# Patient Record
Sex: Female | Born: 1965 | Race: Black or African American | Hispanic: No | Marital: Married | Smoking: Current some day smoker
Health system: Southern US, Community
[De-identification: ages and names within clinical notes are randomized; demographics above are authoritative.]

## PROBLEM LIST (undated history)

## (undated) DIAGNOSIS — I10 Essential (primary) hypertension: Secondary | ICD-10-CM

## (undated) DIAGNOSIS — K625 Hemorrhage of anus and rectum: Secondary | ICD-10-CM

## (undated) DIAGNOSIS — R002 Palpitations: Secondary | ICD-10-CM

## (undated) DIAGNOSIS — E669 Obesity, unspecified: Secondary | ICD-10-CM

## (undated) DIAGNOSIS — B379 Candidiasis, unspecified: Secondary | ICD-10-CM

## (undated) DIAGNOSIS — G4733 Obstructive sleep apnea (adult) (pediatric): Secondary | ICD-10-CM

## (undated) HISTORY — DX: Obstructive sleep apnea (adult) (pediatric): G47.33

## (undated) HISTORY — DX: Essential (primary) hypertension: I10

## (undated) HISTORY — DX: Hemorrhage of anus and rectum: K62.5

## (undated) HISTORY — PX: ABDOMINAL HYSTERECTOMY: SHX81

## (undated) HISTORY — DX: Palpitations: R00.2

## (undated) HISTORY — PX: OTHER SURGICAL HISTORY: SHX169

## (undated) HISTORY — DX: Obesity, unspecified: E66.9

---

## 1998-02-15 ENCOUNTER — Emergency Department (HOSPITAL_COMMUNITY): Admission: EM | Admit: 1998-02-15 | Discharge: 1998-02-15 | Payer: Self-pay | Admitting: Internal Medicine

## 1998-05-19 ENCOUNTER — Emergency Department (HOSPITAL_COMMUNITY): Admission: EM | Admit: 1998-05-19 | Discharge: 1998-05-19 | Payer: Self-pay

## 1998-09-07 ENCOUNTER — Emergency Department (HOSPITAL_COMMUNITY): Admission: EM | Admit: 1998-09-07 | Discharge: 1998-09-07 | Payer: Self-pay | Admitting: Emergency Medicine

## 1998-09-07 ENCOUNTER — Encounter: Payer: Self-pay | Admitting: Emergency Medicine

## 1998-09-25 ENCOUNTER — Emergency Department (HOSPITAL_COMMUNITY): Admission: EM | Admit: 1998-09-25 | Discharge: 1998-09-25 | Payer: Self-pay | Admitting: Emergency Medicine

## 1999-01-11 ENCOUNTER — Other Ambulatory Visit: Admission: RE | Admit: 1999-01-11 | Discharge: 1999-01-11 | Payer: Self-pay | Admitting: Obstetrics and Gynecology

## 1999-04-13 ENCOUNTER — Emergency Department (HOSPITAL_COMMUNITY): Admission: EM | Admit: 1999-04-13 | Discharge: 1999-04-13 | Payer: Self-pay | Admitting: Emergency Medicine

## 1999-05-28 ENCOUNTER — Emergency Department (HOSPITAL_COMMUNITY): Admission: EM | Admit: 1999-05-28 | Discharge: 1999-05-28 | Payer: Self-pay | Admitting: Emergency Medicine

## 1999-05-30 ENCOUNTER — Emergency Department (HOSPITAL_COMMUNITY): Admission: EM | Admit: 1999-05-30 | Discharge: 1999-05-30 | Payer: Self-pay | Admitting: Emergency Medicine

## 1999-06-27 ENCOUNTER — Emergency Department (HOSPITAL_COMMUNITY): Admission: EM | Admit: 1999-06-27 | Discharge: 1999-06-27 | Payer: Self-pay | Admitting: Emergency Medicine

## 1999-07-14 ENCOUNTER — Encounter: Payer: Self-pay | Admitting: Emergency Medicine

## 1999-07-14 ENCOUNTER — Emergency Department (HOSPITAL_COMMUNITY): Admission: EM | Admit: 1999-07-14 | Discharge: 1999-07-14 | Payer: Self-pay | Admitting: Emergency Medicine

## 2000-01-23 ENCOUNTER — Emergency Department (HOSPITAL_COMMUNITY): Admission: EM | Admit: 2000-01-23 | Discharge: 2000-01-23 | Payer: Self-pay | Admitting: Emergency Medicine

## 2000-02-02 ENCOUNTER — Emergency Department (HOSPITAL_COMMUNITY): Admission: EM | Admit: 2000-02-02 | Discharge: 2000-02-02 | Payer: Self-pay | Admitting: Emergency Medicine

## 2000-02-02 ENCOUNTER — Other Ambulatory Visit: Admission: RE | Admit: 2000-02-02 | Discharge: 2000-02-02 | Payer: Self-pay | Admitting: Physical Therapy

## 2000-03-27 ENCOUNTER — Encounter: Payer: Self-pay | Admitting: Emergency Medicine

## 2000-03-27 ENCOUNTER — Emergency Department (HOSPITAL_COMMUNITY): Admission: EM | Admit: 2000-03-27 | Discharge: 2000-03-27 | Payer: Self-pay | Admitting: Emergency Medicine

## 2001-04-14 ENCOUNTER — Emergency Department (HOSPITAL_COMMUNITY): Admission: EM | Admit: 2001-04-14 | Discharge: 2001-04-14 | Payer: Self-pay | Admitting: Emergency Medicine

## 2001-11-19 ENCOUNTER — Emergency Department (HOSPITAL_COMMUNITY): Admission: EM | Admit: 2001-11-19 | Discharge: 2001-11-19 | Payer: Self-pay | Admitting: *Deleted

## 2001-11-20 ENCOUNTER — Encounter: Admission: RE | Admit: 2001-11-20 | Discharge: 2001-11-20 | Payer: Self-pay | Admitting: Obstetrics and Gynecology

## 2001-11-20 ENCOUNTER — Encounter: Payer: Self-pay | Admitting: Obstetrics and Gynecology

## 2001-11-28 ENCOUNTER — Other Ambulatory Visit: Admission: RE | Admit: 2001-11-28 | Discharge: 2001-11-28 | Payer: Self-pay | Admitting: Obstetrics and Gynecology

## 2001-12-17 ENCOUNTER — Emergency Department (HOSPITAL_COMMUNITY): Admission: EM | Admit: 2001-12-17 | Discharge: 2001-12-18 | Payer: Self-pay | Admitting: Emergency Medicine

## 2001-12-18 ENCOUNTER — Encounter: Payer: Self-pay | Admitting: Emergency Medicine

## 2003-06-17 ENCOUNTER — Emergency Department (HOSPITAL_COMMUNITY): Admission: EM | Admit: 2003-06-17 | Discharge: 2003-06-17 | Payer: Self-pay | Admitting: Emergency Medicine

## 2003-08-27 ENCOUNTER — Emergency Department (HOSPITAL_COMMUNITY): Admission: EM | Admit: 2003-08-27 | Discharge: 2003-08-27 | Payer: Self-pay | Admitting: Emergency Medicine

## 2003-10-14 ENCOUNTER — Emergency Department (HOSPITAL_COMMUNITY): Admission: EM | Admit: 2003-10-14 | Discharge: 2003-10-14 | Payer: Self-pay | Admitting: Emergency Medicine

## 2003-12-27 ENCOUNTER — Emergency Department (HOSPITAL_COMMUNITY): Admission: EM | Admit: 2003-12-27 | Discharge: 2003-12-27 | Payer: Self-pay | Admitting: Emergency Medicine

## 2004-02-09 ENCOUNTER — Emergency Department (HOSPITAL_COMMUNITY): Admission: EM | Admit: 2004-02-09 | Discharge: 2004-02-09 | Payer: Self-pay | Admitting: Emergency Medicine

## 2004-03-25 ENCOUNTER — Emergency Department (HOSPITAL_COMMUNITY): Admission: EM | Admit: 2004-03-25 | Discharge: 2004-03-25 | Payer: Self-pay | Admitting: Emergency Medicine

## 2004-04-26 ENCOUNTER — Inpatient Hospital Stay (HOSPITAL_COMMUNITY): Admission: AD | Admit: 2004-04-26 | Discharge: 2004-04-26 | Payer: Self-pay | Admitting: *Deleted

## 2004-07-14 ENCOUNTER — Inpatient Hospital Stay (HOSPITAL_COMMUNITY): Admission: AD | Admit: 2004-07-14 | Discharge: 2004-07-14 | Payer: Self-pay | Admitting: *Deleted

## 2004-07-26 ENCOUNTER — Other Ambulatory Visit: Admission: RE | Admit: 2004-07-26 | Discharge: 2004-07-26 | Payer: Self-pay | Admitting: Obstetrics and Gynecology

## 2004-08-04 ENCOUNTER — Ambulatory Visit: Payer: Self-pay | Admitting: Internal Medicine

## 2004-08-09 ENCOUNTER — Encounter: Admission: RE | Admit: 2004-08-09 | Discharge: 2004-08-09 | Payer: Self-pay | Admitting: Internal Medicine

## 2004-08-10 ENCOUNTER — Ambulatory Visit: Payer: Self-pay

## 2004-08-18 ENCOUNTER — Ambulatory Visit: Payer: Self-pay

## 2004-08-23 ENCOUNTER — Ambulatory Visit: Payer: Self-pay

## 2004-08-29 ENCOUNTER — Ambulatory Visit: Payer: Self-pay | Admitting: Internal Medicine

## 2004-09-01 ENCOUNTER — Ambulatory Visit: Payer: Self-pay | Admitting: Internal Medicine

## 2006-01-23 ENCOUNTER — Emergency Department (HOSPITAL_COMMUNITY): Admission: EM | Admit: 2006-01-23 | Discharge: 2006-01-23 | Payer: Self-pay | Admitting: Emergency Medicine

## 2006-03-04 ENCOUNTER — Emergency Department (HOSPITAL_COMMUNITY): Admission: EM | Admit: 2006-03-04 | Discharge: 2006-03-04 | Payer: Self-pay | Admitting: Family Medicine

## 2006-03-18 ENCOUNTER — Emergency Department (HOSPITAL_COMMUNITY): Admission: EM | Admit: 2006-03-18 | Discharge: 2006-03-18 | Payer: Self-pay | Admitting: Emergency Medicine

## 2006-04-13 ENCOUNTER — Emergency Department (HOSPITAL_COMMUNITY): Admission: EM | Admit: 2006-04-13 | Discharge: 2006-04-13 | Payer: Self-pay | Admitting: Emergency Medicine

## 2006-06-20 ENCOUNTER — Emergency Department (HOSPITAL_COMMUNITY): Admission: EM | Admit: 2006-06-20 | Discharge: 2006-06-20 | Payer: Self-pay | Admitting: Emergency Medicine

## 2006-11-22 ENCOUNTER — Emergency Department (HOSPITAL_COMMUNITY): Admission: EM | Admit: 2006-11-22 | Discharge: 2006-11-22 | Payer: Self-pay | Admitting: Emergency Medicine

## 2006-12-05 ENCOUNTER — Emergency Department (HOSPITAL_COMMUNITY): Admission: EM | Admit: 2006-12-05 | Discharge: 2006-12-05 | Payer: Self-pay | Admitting: Family Medicine

## 2007-05-26 ENCOUNTER — Encounter: Payer: Self-pay | Admitting: Internal Medicine

## 2007-05-26 ENCOUNTER — Ambulatory Visit: Payer: Self-pay | Admitting: Internal Medicine

## 2007-05-26 ENCOUNTER — Observation Stay (HOSPITAL_COMMUNITY): Admission: EM | Admit: 2007-05-26 | Discharge: 2007-05-28 | Payer: Self-pay | Admitting: Emergency Medicine

## 2007-05-27 ENCOUNTER — Encounter (INDEPENDENT_AMBULATORY_CARE_PROVIDER_SITE_OTHER): Payer: Self-pay | Admitting: *Deleted

## 2007-05-27 ENCOUNTER — Ambulatory Visit: Payer: Self-pay | Admitting: Vascular Surgery

## 2007-06-11 ENCOUNTER — Ambulatory Visit: Payer: Self-pay

## 2007-06-16 ENCOUNTER — Ambulatory Visit: Payer: Self-pay

## 2007-06-16 ENCOUNTER — Encounter: Payer: Self-pay | Admitting: Internal Medicine

## 2007-06-20 ENCOUNTER — Ambulatory Visit: Payer: Self-pay | Admitting: Internal Medicine

## 2007-10-14 ENCOUNTER — Emergency Department: Payer: Self-pay | Admitting: Emergency Medicine

## 2008-05-25 ENCOUNTER — Ambulatory Visit: Payer: Self-pay | Admitting: Diagnostic Radiology

## 2008-05-25 ENCOUNTER — Ambulatory Visit: Payer: Self-pay | Admitting: Internal Medicine

## 2008-05-25 ENCOUNTER — Ambulatory Visit (HOSPITAL_BASED_OUTPATIENT_CLINIC_OR_DEPARTMENT_OTHER): Admission: RE | Admit: 2008-05-25 | Discharge: 2008-05-25 | Payer: Self-pay | Admitting: Internal Medicine

## 2008-05-25 DIAGNOSIS — I10 Essential (primary) hypertension: Secondary | ICD-10-CM

## 2008-05-25 DIAGNOSIS — R0989 Other specified symptoms and signs involving the circulatory and respiratory systems: Secondary | ICD-10-CM

## 2008-05-25 DIAGNOSIS — N644 Mastodynia: Secondary | ICD-10-CM

## 2008-05-25 DIAGNOSIS — R062 Wheezing: Secondary | ICD-10-CM

## 2008-05-25 DIAGNOSIS — R0789 Other chest pain: Secondary | ICD-10-CM | POA: Insufficient documentation

## 2008-05-25 DIAGNOSIS — R0609 Other forms of dyspnea: Secondary | ICD-10-CM

## 2008-05-25 LAB — CONVERTED CEMR LAB
Basophils Absolute: 0 10*3/uL (ref 0.0–0.1)
Chloride: 103 meq/L (ref 96–112)
Eosinophils Absolute: 0.1 10*3/uL (ref 0.0–0.7)
Eosinophils Relative: 1.5 % (ref 0.0–5.0)
HCT: 38.3 % (ref 36.0–46.0)
Hgb A1c MFr Bld: 6.3 % — ABNORMAL HIGH (ref 4.6–6.0)
Monocytes Absolute: 0.5 10*3/uL (ref 0.1–1.0)
Neutrophils Relative %: 65.4 % (ref 43.0–77.0)
Platelets: 261 10*3/uL (ref 150–400)
Potassium: 4.1 meq/L (ref 3.5–5.1)
Pro B Natriuretic peptide (BNP): 32 pg/mL (ref 0.0–100.0)
RDW: 12.7 % (ref 11.5–14.6)
Sodium: 141 meq/L (ref 135–145)
TSH: 1.76 microintl units/mL (ref 0.35–5.50)

## 2008-05-31 ENCOUNTER — Telehealth: Payer: Self-pay | Admitting: Internal Medicine

## 2008-06-01 ENCOUNTER — Ambulatory Visit: Payer: Self-pay | Admitting: Internal Medicine

## 2008-06-01 DIAGNOSIS — R109 Unspecified abdominal pain: Secondary | ICD-10-CM

## 2008-06-01 DIAGNOSIS — K625 Hemorrhage of anus and rectum: Secondary | ICD-10-CM

## 2008-06-01 LAB — CONVERTED CEMR LAB
Bilirubin Urine: NEGATIVE
Specific Gravity, Urine: <1.005
Urobilinogen, UA: 0.2
WBC Urine, dipstick: NEGATIVE

## 2008-06-02 ENCOUNTER — Ambulatory Visit: Payer: Self-pay | Admitting: Cardiology

## 2008-06-02 ENCOUNTER — Telehealth: Payer: Self-pay | Admitting: Internal Medicine

## 2008-06-07 ENCOUNTER — Ambulatory Visit: Payer: Self-pay

## 2008-06-11 ENCOUNTER — Ambulatory Visit: Payer: Self-pay | Admitting: Internal Medicine

## 2008-06-14 ENCOUNTER — Ambulatory Visit: Payer: Self-pay | Admitting: Pulmonary Disease

## 2008-06-14 ENCOUNTER — Telehealth: Payer: Self-pay | Admitting: Internal Medicine

## 2008-06-14 DIAGNOSIS — G4733 Obstructive sleep apnea (adult) (pediatric): Secondary | ICD-10-CM | POA: Insufficient documentation

## 2008-06-17 ENCOUNTER — Telehealth: Payer: Self-pay | Admitting: Pulmonary Disease

## 2008-06-17 ENCOUNTER — Ambulatory Visit: Payer: Self-pay | Admitting: Diagnostic Radiology

## 2008-06-17 ENCOUNTER — Emergency Department (HOSPITAL_BASED_OUTPATIENT_CLINIC_OR_DEPARTMENT_OTHER): Admission: EM | Admit: 2008-06-17 | Discharge: 2008-06-17 | Payer: Self-pay | Admitting: Emergency Medicine

## 2008-06-22 LAB — HM MAMMOGRAPHY: HM Mammogram: NORMAL

## 2008-06-22 LAB — CONVERTED CEMR LAB: Pap Smear: NORMAL

## 2008-06-24 ENCOUNTER — Ambulatory Visit (HOSPITAL_BASED_OUTPATIENT_CLINIC_OR_DEPARTMENT_OTHER): Admission: RE | Admit: 2008-06-24 | Discharge: 2008-06-24 | Payer: Self-pay | Admitting: Pulmonary Disease

## 2008-06-24 ENCOUNTER — Encounter: Payer: Self-pay | Admitting: Pulmonary Disease

## 2008-07-03 ENCOUNTER — Ambulatory Visit: Payer: Self-pay | Admitting: Pulmonary Disease

## 2008-07-05 ENCOUNTER — Telehealth (INDEPENDENT_AMBULATORY_CARE_PROVIDER_SITE_OTHER): Payer: Self-pay | Admitting: *Deleted

## 2008-07-06 ENCOUNTER — Encounter (INDEPENDENT_AMBULATORY_CARE_PROVIDER_SITE_OTHER): Payer: Self-pay | Admitting: *Deleted

## 2008-07-06 ENCOUNTER — Encounter: Payer: Self-pay | Admitting: Internal Medicine

## 2008-07-07 ENCOUNTER — Ambulatory Visit: Payer: Self-pay | Admitting: Internal Medicine

## 2008-07-07 ENCOUNTER — Encounter: Payer: Self-pay | Admitting: Internal Medicine

## 2008-07-07 DIAGNOSIS — R002 Palpitations: Secondary | ICD-10-CM

## 2008-07-13 ENCOUNTER — Ambulatory Visit: Payer: Self-pay

## 2008-07-13 ENCOUNTER — Encounter: Payer: Self-pay | Admitting: Internal Medicine

## 2008-07-13 ENCOUNTER — Ambulatory Visit: Payer: Self-pay | Admitting: Cardiology

## 2008-07-13 LAB — CONVERTED CEMR LAB
AST: 17 units/L (ref 0–37)
Albumin: 4.2 g/dL (ref 3.5–5.2)
Alkaline Phosphatase: 77 units/L (ref 39–117)
Bilirubin, Direct: 0.1 mg/dL (ref 0.0–0.3)
LDL Cholesterol: 138 mg/dL — ABNORMAL HIGH (ref 0–99)
Total Bilirubin: 0.5 mg/dL (ref 0.3–1.2)

## 2008-07-19 ENCOUNTER — Ambulatory Visit: Payer: Self-pay | Admitting: Internal Medicine

## 2008-07-19 DIAGNOSIS — K219 Gastro-esophageal reflux disease without esophagitis: Secondary | ICD-10-CM

## 2008-07-19 DIAGNOSIS — E119 Type 2 diabetes mellitus without complications: Secondary | ICD-10-CM

## 2008-07-19 DIAGNOSIS — E785 Hyperlipidemia, unspecified: Secondary | ICD-10-CM | POA: Insufficient documentation

## 2008-07-19 DIAGNOSIS — J309 Allergic rhinitis, unspecified: Secondary | ICD-10-CM | POA: Insufficient documentation

## 2008-07-19 LAB — CONVERTED CEMR LAB
Cholesterol, target level: 200 mg/dL
HDL goal, serum: 40 mg/dL

## 2008-07-23 ENCOUNTER — Ambulatory Visit: Payer: Self-pay | Admitting: Pulmonary Disease

## 2008-08-02 ENCOUNTER — Encounter: Payer: Self-pay | Admitting: Internal Medicine

## 2008-08-07 DIAGNOSIS — K625 Hemorrhage of anus and rectum: Secondary | ICD-10-CM

## 2008-08-07 HISTORY — DX: Hemorrhage of anus and rectum: K62.5

## 2008-08-11 ENCOUNTER — Ambulatory Visit: Payer: Self-pay | Admitting: Gastroenterology

## 2008-08-11 ENCOUNTER — Encounter (INDEPENDENT_AMBULATORY_CARE_PROVIDER_SITE_OTHER): Payer: Self-pay | Admitting: *Deleted

## 2008-08-12 ENCOUNTER — Encounter (INDEPENDENT_AMBULATORY_CARE_PROVIDER_SITE_OTHER): Payer: Self-pay | Admitting: *Deleted

## 2008-08-17 ENCOUNTER — Ambulatory Visit: Payer: Self-pay | Admitting: Gastroenterology

## 2008-08-20 ENCOUNTER — Telehealth: Payer: Self-pay | Admitting: Gastroenterology

## 2008-08-20 ENCOUNTER — Ambulatory Visit: Payer: Self-pay | Admitting: Gastroenterology

## 2008-11-04 ENCOUNTER — Emergency Department: Payer: Self-pay | Admitting: Emergency Medicine

## 2008-12-17 ENCOUNTER — Encounter: Payer: Self-pay | Admitting: Internal Medicine

## 2009-02-23 ENCOUNTER — Telehealth: Payer: Self-pay | Admitting: Family Medicine

## 2009-03-01 ENCOUNTER — Encounter: Payer: Self-pay | Admitting: Internal Medicine

## 2009-03-15 ENCOUNTER — Telehealth: Payer: Self-pay | Admitting: Internal Medicine

## 2009-04-22 ENCOUNTER — Telehealth: Payer: Self-pay | Admitting: Internal Medicine

## 2009-04-26 ENCOUNTER — Ambulatory Visit: Payer: Self-pay | Admitting: Internal Medicine

## 2009-04-26 DIAGNOSIS — J018 Other acute sinusitis: Secondary | ICD-10-CM

## 2009-04-26 LAB — CONVERTED CEMR LAB
CO2: 25 meq/L (ref 19–32)
Calcium: 9.7 mg/dL (ref 8.4–10.5)
Creatinine, Ser: 0.89 mg/dL (ref 0.40–1.20)
Sodium: 140 meq/L (ref 135–145)

## 2009-04-27 ENCOUNTER — Encounter: Payer: Self-pay | Admitting: Internal Medicine

## 2009-04-28 ENCOUNTER — Encounter: Payer: Self-pay | Admitting: Pulmonary Disease

## 2009-06-08 ENCOUNTER — Encounter: Payer: Self-pay | Admitting: Internal Medicine

## 2009-08-08 ENCOUNTER — Ambulatory Visit: Payer: Self-pay | Admitting: Internal Medicine

## 2009-08-08 LAB — CONVERTED CEMR LAB
BUN: 16 mg/dL (ref 6–23)
Calcium: 9.6 mg/dL (ref 8.4–10.5)
Chloride: 105 meq/L (ref 96–112)
Creatinine, Ser: 0.79 mg/dL (ref 0.40–1.20)
Creatinine, Urine: 145.5 mg/dL
Hgb A1c MFr Bld: 6.1 % — ABNORMAL HIGH (ref <5.7)
Microalb Creat Ratio: 3.4 mg/g (ref 0.0–30.0)
Microalb, Ur: 0.5 mg/dL (ref 0.00–1.89)
TSH: 1.583 microintl units/mL (ref 0.350–4.500)

## 2009-08-09 ENCOUNTER — Encounter (INDEPENDENT_AMBULATORY_CARE_PROVIDER_SITE_OTHER): Payer: Self-pay | Admitting: *Deleted

## 2009-08-09 ENCOUNTER — Encounter: Payer: Self-pay | Admitting: Internal Medicine

## 2009-09-01 ENCOUNTER — Inpatient Hospital Stay (HOSPITAL_COMMUNITY): Admission: AD | Admit: 2009-09-01 | Discharge: 2009-09-01 | Payer: Self-pay | Admitting: Obstetrics & Gynecology

## 2009-09-01 ENCOUNTER — Telehealth: Payer: Self-pay | Admitting: Internal Medicine

## 2009-09-22 ENCOUNTER — Emergency Department: Payer: Self-pay | Admitting: Emergency Medicine

## 2009-11-15 ENCOUNTER — Encounter: Payer: Self-pay | Admitting: Internal Medicine

## 2010-03-07 ENCOUNTER — Telehealth: Payer: Self-pay | Admitting: Internal Medicine

## 2010-03-10 ENCOUNTER — Telehealth: Payer: Self-pay | Admitting: Internal Medicine

## 2010-03-10 ENCOUNTER — Ambulatory Visit: Payer: Self-pay | Admitting: Internal Medicine

## 2010-03-10 DIAGNOSIS — H538 Other visual disturbances: Secondary | ICD-10-CM

## 2010-03-10 LAB — CONVERTED CEMR LAB
Blood Glucose, Fingerstick: 102
Calcium: 8.9 mg/dL (ref 8.4–10.5)
Creatinine, Ser: 0.95 mg/dL (ref 0.40–1.20)
Hgb A1c MFr Bld: 6.2 % — ABNORMAL HIGH (ref <5.7)
Sodium: 140 meq/L (ref 135–145)

## 2010-04-20 ENCOUNTER — Ambulatory Visit (HOSPITAL_BASED_OUTPATIENT_CLINIC_OR_DEPARTMENT_OTHER)
Admission: RE | Admit: 2010-04-20 | Discharge: 2010-04-20 | Payer: Self-pay | Source: Home / Self Care | Attending: Internal Medicine | Admitting: Internal Medicine

## 2010-04-20 ENCOUNTER — Encounter: Payer: Self-pay | Admitting: Internal Medicine

## 2010-04-20 ENCOUNTER — Ambulatory Visit
Admission: RE | Admit: 2010-04-20 | Discharge: 2010-04-20 | Payer: Self-pay | Source: Home / Self Care | Attending: Internal Medicine | Admitting: Internal Medicine

## 2010-04-20 ENCOUNTER — Telehealth: Payer: Self-pay | Admitting: Internal Medicine

## 2010-04-20 DIAGNOSIS — M542 Cervicalgia: Secondary | ICD-10-CM | POA: Insufficient documentation

## 2010-04-20 DIAGNOSIS — M25559 Pain in unspecified hip: Secondary | ICD-10-CM | POA: Insufficient documentation

## 2010-04-22 ENCOUNTER — Ambulatory Visit (HOSPITAL_BASED_OUTPATIENT_CLINIC_OR_DEPARTMENT_OTHER)
Admission: RE | Admit: 2010-04-22 | Discharge: 2010-04-22 | Payer: Self-pay | Source: Home / Self Care | Attending: Internal Medicine | Admitting: Internal Medicine

## 2010-04-24 ENCOUNTER — Encounter: Payer: Self-pay | Admitting: Internal Medicine

## 2010-04-24 ENCOUNTER — Ambulatory Visit
Admission: RE | Admit: 2010-04-24 | Discharge: 2010-04-24 | Payer: Self-pay | Source: Home / Self Care | Attending: Internal Medicine | Admitting: Internal Medicine

## 2010-04-24 DIAGNOSIS — M546 Pain in thoracic spine: Secondary | ICD-10-CM | POA: Insufficient documentation

## 2010-04-30 ENCOUNTER — Encounter: Payer: Self-pay | Admitting: Internal Medicine

## 2010-05-03 ENCOUNTER — Ambulatory Visit (HOSPITAL_COMMUNITY)
Admission: RE | Admit: 2010-05-03 | Discharge: 2010-05-03 | Payer: Self-pay | Source: Home / Self Care | Attending: Internal Medicine | Admitting: Internal Medicine

## 2010-05-04 ENCOUNTER — Encounter: Payer: Self-pay | Admitting: Internal Medicine

## 2010-05-04 ENCOUNTER — Telehealth: Payer: Self-pay | Admitting: Internal Medicine

## 2010-05-08 ENCOUNTER — Ambulatory Visit: Admit: 2010-05-08 | Payer: Self-pay | Admitting: Internal Medicine

## 2010-05-09 NOTE — Progress Notes (Signed)
Summary: WOULD LIKE TO HAVE CHOLESTEROL CHECKED   Phone Note Call from Patient Call back at [phone]   Caller: Patient Call For: YOO  Summary of Call: PATIENT IS IN THE LAB AND SHE WOULD LIKE TO HAVE CHOLESTEROL CHECKED COULD SHE HAVE AN ORDER FOR THIS.   Initial call taken by: Roselle Locus,  March 10, 2010 3:52 PM  Follow-up for Phone Call        call returned to patient at [phone] she was informed cholesterol check has to be done fasting 10 hours prior to blood draw. She was advised to call back to schedule cholesterol check either the day before or the day of blood draw for order. Patient has verbalized understanding and agrees as instructed Follow-up by: Glendell Docker CMA,  March 10, 2010 4:39 PM

## 2010-05-09 NOTE — Assessment & Plan Note (Signed)
Summary: f/u meds- jr   Vital Signs:  Patient profile:   45 year old female Weight:      295 pounds BMI:     50.82 O2 Sat:      100 % on Room air Temp:     97.9 degrees F oral Pulse rate:   83 / minute Pulse rhythm:   regular Resp:     18 per minute BP sitting:   138 / 80  (right arm) Cuff size:   large  Vitals Entered By: Glendell Docker CMA (April 26, 2009 4:17 PM)  O2 Flow:  Room air  Primary Care Provider:  Thomos Lemons, DO  CC:  Follow up.  History of Present Illness: 45 y/o with hx of htn, palpitations, OSA, borderline DM II for f/u.  last OV 07/2008.   she has not take her meds regularly she c/o left ear pain, sinus congestion  DM II borderline - never continued metformin.  she has been taking bee pollen for wt control  Htn -  poor compliance.  she has been taking 1/2 of atenolol  Hyperlipidemia - poor compliance  Allergies: 1)  ! Sulfa  Past History:  Past Medical History:  1. Palpitations      --event monito 3/10:  2. Chest pain.           --negative Myoview March 2009.   3. Hypertension, controlled.   4. Obstructive sleep apnea, mild   5. Obesity.    6. Hx of left leg lipoma - S/P excision   7.  rectal bleeding - normal colonoscopy in 08/2008 - Dr. Arlyce Dice  Past Surgical History: Hysterectomy    Family History: Family History of CAD Female 1st degree relative <60 Family History of CAD Female 1st degree relative <50 Family History of Stroke M 1st degree relative <50     allergies: daughter, sister asthma: daughter heart disease: mother (m.i.) father (m.i.) paternal grandfather (m.i. & stroke)  rheumatism: mother, maternal grandmother cancer: maternal grandfather (prostate)     Social History: Occupation: works for [employer]  Married 3 children Never Smoked Alcohol use-yes (occasional)     Review of Systems  The patient denies fever, chest pain, and prolonged cough.    Physical Exam  General:  alert and overweight-appearing.   Ears:   R ear normal.  Lt TM slightly red Mouth:  no exudates and pharyngeal erythema.   Neck:  supple.  no mass,  no tenderness Lungs:  normal respiratory effort and normal breath sounds.   Heart:  normal rate, regular rhythm, no gallop, no rub, and no JVD.   Extremities:  trace left pedal edema and trace right pedal edema.     Impression & Recommendations:  Problem # 1:  DIABETES MELLITUS, BORDERLINE (ICD-790.29) poor compliance.  poor insight despite counseling.  monitor A1c.  Her updated medication list for this problem includes:    Metformin Hcl 500 Mg Xr24h-tab (Metformin hcl) ..... One by mouth two times a day before or with meals  Orders: T-Basic Metabolic Panel [id number]) T- Hemoglobin A1C ([id number])  Problem # 2:  HYPERTENSION (ICD-401.9) Assessment: Deteriorated Poor compliance.  change atenolol to bystolic.  atenolol likely aggravating blood sugar control  Her updated medication list for this problem includes:    Bystolic 5 Mg Tabs (Nebivolol hcl) ..... One by mouth qd    Lasix 40 Mg Tabs (Furosemide) .Marland Kitchen... Take 1 tablet by mouth once a day  BP today: 138/80 Prior BP: 116/74 (08/11/2008)  Prior 10  Yr Risk Heart Disease: 11 % (07/19/2008)  Labs Reviewed: K+: 4.1 (05/25/2008) Creat: : 0.9 (05/25/2008)   Chol: 192 (07/13/2008)   HDL: 34 (07/13/2008)   LDL: 138 (07/13/2008)   TG: 98 (07/13/2008)  Problem # 3:  HYPERLIPIDEMIA (ICD-272.4) Poor compliance.  restart statin.  goal LDL < 100 Her updated medication list for this problem includes:    Pravastatin Sodium 40 Mg Tabs (Pravastatin sodium) ..... One by mouth qpm  Labs Reviewed: SGOT: 17 (07/13/2008)   SGPT: 18 (07/13/2008)  Lipid Goals: Chol Goal: 200 (07/19/2008)   HDL Goal: 40 (07/19/2008)   LDL Goal: 100 (07/19/2008)   TG Goal: 150 (07/19/2008)  Prior 10 Yr Risk Heart Disease: 11 % (07/19/2008)   HDL:34 (07/13/2008)  LDL:138 (07/13/2008)  Chol:192 (07/13/2008)  Trig:98 (07/13/2008)  Problem # 4:   RHINOSINUSITIS, ACUTE (ICD-461.8)  Her updated medication list for this problem includes:    Flonase 50 Mcg/act Susp (Fluticasone propionate) .Marland Kitchen... 2 sprays each nostril qd    Cefuroxime Axetil 500 Mg Tabs (Cefuroxime axetil) ..... One by mouth bid  Instructed on treatment. Call if symptoms persist or worsen.   Problem # 5:  GERD (ICD-530.81) Pt reports upper airway much better when she uses omeprazole regularly.  Her updated medication list for this problem includes:    Omeprazole 20 Mg Cpdr (Omeprazole) ..... One by mouth qam 30 mins before meals daily  Complete Medication List: 1)  Bystolic 5 Mg Tabs (Nebivolol hcl) .... One by mouth qd 2)  Lasix 40 Mg Tabs (Furosemide) .... Take 1 tablet by mouth once a day 3)  Omeprazole 20 Mg Cpdr (Omeprazole) .... One by mouth qam 30 mins before meals daily 4)  Klor-con M10 10 Meq Cr-tabs (Potassium chloride crys cr) .... Take 1 tablet by mouth once a day 5)  Bayer Aspirin 325 Mg Tabs (Aspirin) .... Take 1 tablet by mouth once a day 6)  Metformin Hcl 500 Mg Xr24h-tab (Metformin hcl) .... One by mouth two times a day before or with meals 7)  Pravastatin Sodium 40 Mg Tabs (Pravastatin sodium) .... One by mouth qpm 8)  Flonase 50 Mcg/act Susp (Fluticasone propionate) .... 2 sprays each nostril qd 9)  Cefuroxime Axetil 500 Mg Tabs (Cefuroxime axetil) .... One by mouth bid  Patient Instructions: 1)  Please schedule a follow-up appointment in 2 months. Prescriptions: CEFUROXIME AXETIL 500 MG TABS (CEFUROXIME AXETIL) one by mouth bid  #20 x 0   Entered and Authorized by:   D. Thomos Lemons DO   Signed by:   D. Thomos Lemons DO on 04/26/2009   Method used:   Electron[facility] to        [facility] [phone]* (retail)       [address] [place], [place]  [address]       Ph: [phone] or [phone]       Fax: [phone]   RxID:   [id number] PRAVASTATIN SODIUM 40 MG TABS (PRAVASTATIN SODIUM) one by mouth qpm  #30 x 1    Entered and Authorized by:   D. Thomos Lemons DO   Signed by:   D. Thomos Lemons DO on 04/26/2009   Method used:   Electronically to        [facility] [phone]* (retail)       [address] [place]  [place], [place]  [address]       Ph: [phone] or [phone]       Fax: [phone]   RxID:   [id number] METFORMIN HCL 500 MG XR24H-TAB (METFORMIN HCL) one by mouth two times a day before or with meals  #60 x 1   Entered and Authorized by:   D. Thomos Lemons DO   Signed by:   D. Thomos Lemons DO on 04/26/2009   Method used:   Electronically to        [facility] [phone]* (retail)       [address] [place], [place]  [address]       Ph: [phone] or [phone]       Fax: [phone]   RxID:   [id number] KLOR-CON M10 10 MEQ CR-TABS (POTASSIUM CHLORIDE CRYS CR) Take 1 tablet by mouth once a day  #30 x 1   Entered and Authorized by:   D. Thomos Lemons DO   Signed by:   D. Thomos Lemons DO on 04/26/2009   Method used:   Electronically to        [facility] [phone]* (retail)       [address] [place], [place]  [address]       Ph: [phone] or [phone]       Fax: [phone]   RxID:   [id number] OMEPRAZOLE 20 MG CPDR (OMEPRAZOLE) one by mouth qam 30 mins before meals daily  #30 x 1   Entered and Authorized by:   D. Thomos Lemons DO   Signed by:   D. Thomos Lemons DO on 04/26/2009   Method used:   Electronically to        [facility] [phone]* (retail)       [address] [place], [place]  [address]       Ph: [phone] or [phone]       Fax: [phone]   RxID:   [id number] LASIX 40 MG TABS (FUROSEMIDE) Take 1 tablet by mouth once a day  #30 x 1   Entered and Authorized by:   D. Thomos Lemons DO   Signed by:   D. Thomos Lemons DO on 04/26/2009   Method used:   Electronically to        [facility] [phone]* (retail)       [address] [place], [place]  [address]       Ph: [phone] or [phone]       Fax: [phone]   RxID:   [id number] BYSTOLIC 5 MG TABS (NEBIVOLOL HCL) one by mouth qd  #30 x 1   Entered and Authorized by:   D. Thomos Lemons DO   Signed by:   D. Thomos Lemons DO on 04/26/2009   Method used:   Electronically to        [facility] [phone]* (retail)       [address] [place], [place]  [address]       Ph: [phone] or [phone]  Fax: [phone]   RxID:   [id number]

## 2010-05-09 NOTE — Letter (Signed)
Summary: Work Dietitian at [employer]. [address]   [place], [place] [address]   Phone: [phone]  Fax: [phone]      Today's Date: March 10, 2010    Name of Patient: [name]    The above named patient had a medical visit today. Please take this into consideration when reviewing the time away from work.  Special Instructions:  [ X] None  [  ] To be off the remainder of today, returning to the normal work / school schedule tomorrow.  [  ] To be off until the next scheduled appointment on ______________________.  [  ] Other ________________________________________________________________ ________________________________________________________________________     Sincerely yours,   Glendell Docker CMA Dr Thomos Lemons

## 2010-05-09 NOTE — Progress Notes (Signed)
Summary: Medication Refills  Phone Note Refill Request Message from:  Patient on March 07, 2010 2:56 PM  Refills Requested: Medication #1:  BYSTOLIC 5 MG TABS one by mouth qd   Dosage confirmed as above?Dosage Confirmed   Brand Name Necessary? No   [facility]ed[facility] [address], [place] She is out of pills and needs one refill.  She made an appt for Friday this week    Method Requested: Electronic Next Appointment Scheduled: 03-10-2010 Dr Artist Pais  Initial call taken by: Roselle Locus,  March 07, 2010 2:57 PM  Follow-up for Phone Call        rxs for Lasix and Bystolic sent to pharmacy for one month , no refills provided on medications Follow-up by: Glendell Docker CMA,  March 07, 2010 3:06 PM

## 2010-05-09 NOTE — Letter (Signed)
Summary: CMN/[facility]  CMN/[facility]   Imported By: Lester [place] 05/04/2009 14:16:45  _____________________________________________________________________  External Attachment:    Type:   Image     Comment:   External Document

## 2010-05-09 NOTE — Medication Information (Signed)
Summary: Nonadherence with Bystolic/[facility]  Nonadherence with Bystolic/[facility]   Imported By: Lanelle Bal 08/15/2009 11:33:47  _____________________________________________________________________  External Attachment:    Type:   Image     Comment:   External Document

## 2010-05-09 NOTE — Letter (Signed)
Summary: CMN for CPAP Supplies/[facility]  CMN for CPAP Supplies/[facility]   Imported By: Sherian Rein 05/05/2009 09:47:44  _____________________________________________________________________  External Attachment:    Type:   Image     Comment:   External Document

## 2010-05-09 NOTE — Letter (Signed)
   [place] at [facility] [address] [place], [place]  [address]  [place] Phone: [phone]      April 27, 2009   VICKI CHAFFIN [address] [place], [place] [address]  RE:  LAB RESULTS  Dear  Ms. Worland,  The following is an interpretation of your most recent lab tests.  Please take note of any instructions provided or changes to medications that have resulted from your lab work.  ELECTROLYTES:  Good - no changes needed  KIDNEY FUNCTION TESTS:  Good - no changes needed    DIABETIC STUDIES:  Fair - schedule a follow-up appointment Blood Glucose: 90   HgbA1C: 6.5     Your A1c has gotten slightly worse.  Please follow diabetic diet and take your metformin as directed.      Sincerely Yours,    Dr. Thomos Lemons

## 2010-05-09 NOTE — Assessment & Plan Note (Signed)
Summary: needs meds is out/dt   Vital Signs:  Patient profile:   45 year old female Height:      64 inches Weight:      296 pounds BMI:     50.99 Temp:     98.1 degrees F oral Pulse rate:   72 / minute Pulse rhythm:   regular Resp:     16 per minute BP sitting:   140 / 80  (right arm) Cuff size:   large  Vitals Entered By: Mervin Kung CMA (Aug 08, 2009 4:16 PM) CC: Follow up.  Pt needs refills on Bystolic, Flonase and Lasix., Type 2 diabetes mellitus follow-up Is Patient Diabetic? Yes   Primary Care Provider:  Thomos Lemons, DO  CC:  Follow up.  Pt needs refills on Bystolic, Flonase and Lasix., and Type 2 diabetes mellitus follow-up.  History of Present Illness:  Hypertension Follow-Up      This is a 45 year old woman who presents for Hypertension follow-up.  The patient denies lightheadedness.  The patient denies the following associated symptoms: chest pain.  Compliance with medications (by patient report) has been sporadic and poor.  The patient reports that dietary compliance has been poor.  The patient reports no exercise.    Type 2 Diabetes Mellitus Follow-Up      The patient is also here for Type 2 diabetes mellitus follow-up.  The patient reports blurred vision, but denies weight loss and weight gain.  The patient denies the following symptoms: chest pain.  Since the last visit the patient reports poor dietary compliance, noncompliance with medications, and not monitoring blood glucose.    hyperlipidemia - poor med compliance  Allergies: 1)  ! Sulfa  Past History:  Past Medical History:  1. Palpitations      --event monito 3/10:  2. Chest pain.           --negative Myoview March 2009.   3. Hypertension, controlled.   4. Obstructive sleep apnea, mild    5. Obesity.    6. Hx of left leg lipoma - S/P excision   7.  rectal bleeding - normal colonoscopy in 08/2008 - Dr. Arlyce Dice  Past Surgical History: Hysterectomy      Family History: Family History of CAD  Female 1st degree relative <60 Family History of CAD Female 1st degree relative <50 Family History of Stroke M 1st degree relative <50     allergies: daughter, sister asthma: daughter heart disease: mother (m.i.) father (m.i.) paternal grandfather (m.i. & stroke)  rheumatism: mother, maternal grandmother cancer: maternal grandfather (prostate)      Social History: Occupation: works for [employer]  Married 3 children Never Smoked  Alcohol use-yes (occasional)     Review of Systems       "knots" on right side of abd and near umbilicus  Physical Exam  General:  alert and overweight-appearing.   Lungs:  normal respiratory effort and normal breath sounds.   Heart:  normal rate, regular rhythm, no murmur, and no gallop.   Abdomen:  soft, non-tender, normal bowel sounds,  obese,  irregular fatty tissues but no mass, unable to appreciate hernia Extremities:  trace left pedal edema and trace right pedal edema.     Impression & Recommendations:  Problem # 1:  DIABETES MELLITUS, TYPE II (ICD-250.00)  Monitor A1c.  pt has not been taking metformin regularly.  arrange additional diabetic counseling.  Her updated medication list for this problem includes:    Bayer Aspirin 325  Mg Tabs (Aspirin) .Marland Kitchen... Take 1 tablet by mouth once a day    Metformin Hcl 500 Mg Xr24h-tab (Metformin hcl) ..... One by mouth two times a day before or with meals  Labs Reviewed: Creat: 0.89 (04/26/2009)    Reviewed HgBA1c results: 6.5 (04/26/2009)  6.3 (05/25/2008)  Orders: Ophthalmology Referral (Ophthalmology) Nutrition Referral (Nutrition)  Problem # 2:  HYPERTENSION (ICD-401.9) poor compliance.  meds refilled. stressed importance of taking BP meds regularly  Her updated medication list for this problem includes:    Bystolic 5 Mg Tabs (Nebivolol hcl) ..... One by mouth qd    Lasix 40 Mg Tabs (Furosemide) .Marland Kitchen... Take 1 tablet by mouth once a day  BP today: 140/80 Prior BP: 138/80 (04/26/2009)  Prior  10 Yr Risk Heart Disease: 11 % (07/19/2008)  Labs Reviewed: K+: 4.1 (04/26/2009) Creat: : 0.89 (04/26/2009)   Chol: 192 (07/13/2008)   HDL: 34 (07/13/2008)   LDL: 138 (07/13/2008)   TG: 98 (07/13/2008)  Problem # 3:  HYPERLIPIDEMIA (ICD-272.4) compliance encouraged  Her updated medication list for this problem includes:    Pravastatin Sodium 40 Mg Tabs (Pravastatin sodium) ..... One by mouth qpm  Orders: T-TSH [phone])  Labs Reviewed: SGOT: 17 (07/13/2008)   SGPT: 18 (07/13/2008)  Lipid Goals: Chol Goal: 200 (07/19/2008)   HDL Goal: 40 (07/19/2008)   LDL Goal: 100 (07/19/2008)   TG Goal: 150 (07/19/2008)  Prior 10 Yr Risk Heart Disease: 11 % (07/19/2008)   HDL:34 (07/13/2008)  LDL:138 (07/13/2008)  Chol:192 (07/13/2008)  Trig:98 (07/13/2008)  Complete Medication List: 1)  Bystolic 5 Mg Tabs (Nebivolol hcl) .... One by mouth qd 2)  Lasix 40 Mg Tabs (Furosemide) .... Take 1 tablet by mouth once a day 3)  Omeprazole 20 Mg Cpdr (Omeprazole) .... One by mouth qam 30 mins before meals daily 4)  Klor-con M10 10 Meq Cr-tabs (Potassium chloride crys cr) .... Take 1 tablet by mouth once a day 5)  Bayer Aspirin 325 Mg Tabs (Aspirin) .... Take 1 tablet by mouth once a day 6)  Metformin Hcl 500 Mg Xr24h-tab (Metformin hcl) .... One by mouth two times a day before or with meals 7)  Pravastatin Sodium 40 Mg Tabs (Pravastatin sodium) .... One by mouth qpm 8)  Flonase 50 Mcg/act Susp (Fluticasone propionate) .... 2 sprays each nostril qd 9)  Accu-chek Aviva Strp (Glucose blood) .... Use daily as directed 10)  Accu-chek Multiclix Lancets Misc (Lancets) .... Use once daily as directed  Other Orders: T-Basic Metabolic Panel [phone]) T- Hemoglobin A1C ([id number]) T-Urine Microalbumin w/creat. ratio [id number])  Patient Instructions: 1)  Please schedule a follow-up appointment in 3 months. Prescriptions: ACCU-CHEK MULTICLIX LANCETS  MISC (LANCETS) use once daily as directed   #100 x 2   Entered and Authorized by:   D. Thomos Lemons DO   Signed by:   D. Thomos Lemons DO on 08/08/2009   Method used:   Electronically to        [facility] [phone]* (retail)       [address] [place], [place]  [address]       Ph: [phone] or [phone]       Fax: [phone]   RxID:   [id number] ACCU-CHEK AVIVA  STRP (GLUCOSE BLOOD) use daily as directed  #100 x 2   Entered and Authorized by:   D. Thomos Lemons DO   Signed by:  Dondra Spry DO on 08/08/2009   Method used:   Electronically to        [facility] [phone]* (retail)       [address] [place], [place]  [address]       Ph: [phone] or [phone]       Fax: [phone]   RxID:   [id number] FLONASE 50 MCG/ACT SUSP (FLUTICASONE PROPIONATE) 2 sprays each nostril qd  #1 x 2   Entered and Authorized by:   D. Thomos Lemons DO   Signed by:   D. Thomos Lemons DO on 08/08/2009   Method used:   Electronically to        [facility] [phone]* (retail)       [address] [place], [place]  [address]       Ph: [phone] or [phone]       Fax: [phone]   RxID:   [id number] PRAVASTATIN SODIUM 40 MG TABS (PRAVASTATIN SODIUM) one by mouth qpm  #30 x 2   Entered and Authorized by:   D. Thomos Lemons DO   Signed by:   D. Thomos Lemons DO on 08/08/2009   Method used:   Electronically to        [facility] [phone]* (retail)       [address] [place], [place]  [address]       Ph: [phone] or [phone]       Fax: [phone]   RxID:   [id number] METFORMIN HCL 500 MG XR24H-TAB (METFORMIN HCL) one by mouth two times a day before or with meals  #60 x 2   Entered and Authorized by:   D. Thomos Lemons DO   Signed by:   D. Thomos Lemons DO on 08/08/2009   Method used:   Electronically to        [facility] [phone]* (retail)       [address] [place], [place]  [address]        Ph: [phone] or [phone]       Fax: [phone]   RxID:   [id number] KLOR-CON M10 10 MEQ CR-TABS (POTASSIUM CHLORIDE CRYS CR) Take 1 tablet by mouth once a day  #30 x 2   Entered and Authorized by:   D. Thomos Lemons DO   Signed by:   D. Thomos Lemons DO on 08/08/2009   Method used:   Electronically to        [facility] [phone]* (retail)       [address] [place], [place]  [address]       Ph: [phone] or [phone]       Fax: [phone]   RxID:   [id number] LASIX 40 MG TABS (FUROSEMIDE) Take 1 tablet by mouth once a day  #30 x 2   Entered and Authorized by:   D. Thomos Lemons DO   Signed by:   D. Thomos Lemons DO on 08/08/2009   Method used:   Electronically to  [facility] [phone]* (retail)       [address] [place], [place]  [address]       Ph: [phone] or [phone]       Fax: [phone]   RxID:   [id number] BYSTOLIC 5 MG TABS (NEBIVOLOL HCL) one by mouth qd  #30 x 2   Entered and Authorized by:   D. Thomos Lemons DO   Signed by:   D. Thomos Lemons DO on 08/08/2009   Method used:   Electronically to        [facility] [phone]* (retail)       [address] [place], [place]  [address]       Ph: [phone] or [phone]       Fax: [phone]   RxID:   [id number]   Current Allergies (reviewed today): ! SULFA

## 2010-05-09 NOTE — Letter (Signed)
Summary: Primary Care Consult Scheduled Letter  [place] at [facility]  [address]   [place], [place] [address]   Phone: [phone]  Fax: [phone]      08/09/2009 MRN: [id number]  [name] [address] [place], [place]  [address]    Dear Ms. Cooner,      We have scheduled an appointment for you.  At the recommendation of Dr.YOO, we have scheduled you a consult with DR Mitzi Davenport  , OPHTHALMOLOGY  on MAY 26,2011 at _3:45PM.  Their address is_[address], [place] . The office phone number is _[phone].  If this appointment day and time is not convenient for you, please feel free to call the office of the doctor you are being referred to at the number listed above and reschedule the appointment.     It is important for you to keep your scheduled appointments. We are here to make sure you are given good patient care. If you have questions or you have made changes to your appointment, please notify us at  [phone], ask for  HELEN.    Thank you, Darral Dash Patient Care Coordinator  at [facility]

## 2010-05-09 NOTE — Medication Information (Signed)
Summary: Nonadherence with Lasix/[facility]  Nonadherence with Lasix/[facility]   Imported By: Lanelle Bal 06/15/2009 14:14:45  _____________________________________________________________________  External Attachment:    Type:   Image     Comment:   External Document

## 2010-05-09 NOTE — Letter (Signed)
   [place] at [facility] [address] [place], [place]  [address]  [place] Phone: [phone]      Aug 09, 2009   [name] [address] [place], [place] [address]  RE:  LAB RESULTS  Dear  Ms. Heiden,  The following is an interpretation of your most recent lab tests.  Please take note of any instructions provided or changes to medications that have resulted from your lab work.  ELECTROLYTES:  Good - no changes needed  KIDNEY FUNCTION TESTS:  Good - no changes needed    DIABETIC STUDIES:  Improved - continue management Blood Glucose: 89   HgbA1C: 6.1   Microalbumin/Creatinine Ratio: 3.4          Sincerely Yours,    Dr. Thomos Lemons

## 2010-05-09 NOTE — Progress Notes (Signed)
Summary: epi-pen request  Phone Note Other Incoming   Caller: nurse @ [facility] Summary of Call: Received call from nurse at [facility] stating that pt. needed a refill on her epi pen sent to [facility] [address] [facility] [place].  After further review, I do not see that we have filled rx for pt in the past.  Left message for pt to return my call.  Need to verify if pt needs epi-pen and why does she use it?  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 Can we call one in for her?  Mervin Kung CMA  Sep 01, 2009 12:01 PM   Additional Follow-up for Phone Call Additional follow up Details #1::        If she saw allergist in the past, she needs to call them for refill.  if she wants [place] to refill, I at least need copy of allergist office note Additional Follow-up by: D. Thomos Lemons DO,  Sep 01, 2009 12:05 PM    Additional Follow-up for Phone Call Additional follow up Details #2::    Pt states she saw urgent care in [facility] for reaction to bee sting in the past.  They gave her epi-pen at that time.  She states she doesn't know the name of the clinic or contact number. I asked pt. to find the contact information and call me back with the info.   She is going out of town on Sunday and needs this before she leaves.  Please advise.  Tricia Fergerson CMA  Sep 01, 2009 12:15 PM   Additional Follow-up for Phone Call Additional follow up Details #3:: Details for Additional Follow-up Action Taken: I will call in rx.  pt still needs to contact clinic and get copy of office note Additional Follow-up by: D. Robert Yoo DO,  Sep 01, 2009 1:20 PM  New/Updated Medications: EPIPEN 0.3 MG/0.3ML DEVI (EPINEPHRINE) 0.3 mg IM x 1 ( for severe allergic rxn ) Prescriptions: EPIPEN 0.3 MG/0.3ML DEVI (EPINEPHRINE) 0.3 mg IM x 1 ( for severe allergic rxn )  #1 x 0   Entered and Authorized by:    D. Robert Yoo DO   Signed by:   D. Robert Yoo DO on 09/01/2009   Method used:   [facility]        [facility]  [address] [id number]       [address]       [place], [place]  [address]       Ph: [phone] or [phone]       Fax: [phone]   RxID:   [id number]  attempted to contact patient at [phone] no answer, voice recording states mailbox has not been set up,  call placed to [phone] message left for patient to return call Glendell Docker CMA  Sep 01, 2009 1:51 PM    no return call from patient Glendell Docker CMA  Sep 02, 2009 3:48 PM

## 2010-05-09 NOTE — Progress Notes (Signed)
Summary: Rx Denial  Phone Note Refill Request Message from:  Pharmacy on April 22, 2009 8:31 AM  Refills Requested: Medication #1:  ATENOLOL 50 MG TABS 1 tablet by mouth DAILY   Dosage confirmed as above?Dosage Confirmed  Medication #2:  LASIX 40 MG TABS Take 1 tablet by mouth once a day   Dosage confirmed as above?Dosage Confirmed Request for [facility]ill [facility]   Initial call taken by: Michaelle Copas,  April 22, 2009 8:32 AM  Follow-up for Phone Call        Rx denied because office visit is needed Follow-up by: Glendell Docker CMA,  April 22, 2009 8:58 AM      Denied Prescriptions ATENOLOL 50 MG TABS (ATENOLOL)  1 tablet by mouth DAILY denied because patient needs office visit. LASIX 40 MG TABS (FUROSEMIDE)  Take 1 tablet by mouth once a day denied because patient needs office visit. Comments Pharmacy advised rxs refill have been denied. Patient has to schedule office visit

## 2010-05-09 NOTE — Letter (Signed)
Summary: Patient Not Returning Calls/[facility]  Patient Not Returning Calls/[facility]   Imported By: Lanelle Bal 11/22/2009 12:38:21  _____________________________________________________________________  External Attachment:    Type:   Image     Comment:   External Document

## 2010-05-10 ENCOUNTER — Encounter: Payer: Self-pay | Admitting: Internal Medicine
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
## 2010-05-11 NOTE — Letter (Signed)
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         Sincerely,    Mervin Kung CMA (AAMA)

## 2010-05-11 NOTE — Assessment & Plan Note (Signed)
Summary: med refill/mhf   Vital Signs:  Patient profile:   45 year old female Height:      64 inches Weight:      296 pounds BMI:     50.99 O2 Sat:      99 % on Room air Temp:     98.1 degrees F oral Pulse rate:   83 / minute Resp:     18 per minute BP sitting:   134 / 70  (right arm) Cuff size:   large  Vitals Entered By: Glendell Docker CMA (March 10, 2010 1:48 PM)  O2 Flow:  Room air CC: follow-up visit Is Patient Diabetic? Yes Did you bring your meter with you today? No Pain Assessment Patient in pain? no      CBG Result 102 Comments no checking blood sugars, discuss surgery tummy tuck, refills on Bystolic and Lasix     Last PAP Result due   Primary Care Provider:  Dondra Spry DO  CC:  follow-up visit.  History of Present Illness:  Hypertension Follow-Up      This is a 45 year old woman who presents for Hypertension follow-up.  The patient reports headaches, but denies lightheadedness.  Compliance with medications (by patient report) has been sporadic and poor.  The patient reports that dietary compliance has been poor.  The patient reports no exercise.    DM II borderline - poor compliance interested in "tummy tuck" pt struggles with binge eating.  she can't control her symptoms  Allergies: 1)  ! Sulfa  Past History:  Past Medical History:  1. Palpitations      --event monito 3/10:  2. Chest pain.            --negative Myoview March 2009.   3. Hypertension, controlled.   4. Obstructive sleep apnea, mild    5. Obesity.    6. Hx of left leg lipoma - S/P excision   7.  rectal bleeding - normal colonoscopy in 08/2008 - Dr. Arlyce Dice  Past Surgical History: Hysterectomy       Family History: Family History of CAD Female 1st degree relative <60 Family History of CAD Female 1st degree relative <50 Family History of Stroke M 1st degree relative <50     allergies: daughter, sister asthma: daughter heart disease: mother (m.i.) father (m.i.)  paternal grandfather (m.i. & stroke)  rheumatism: mother, maternal grandmother cancer: maternal grandfather (prostate)        Social History: Occupation: works at [employer] daughter went to college Married 3 children  Never Smoked  Alcohol use-yes (occasional)     Physical Exam  General:  alert and overweight-appearing.   Lungs:  normal respiratory effort and normal breath sounds.   Heart:  normal rate, regular rhythm, no murmur, and no gallop.   Extremities:  trace left pedal edema and trace right pedal edema.     Impression & Recommendations:  Problem # 1:  DIABETES MELLITUS, TYPE II (ICD-250.00) Assessment Deteriorated Pt counseled on diet and exercise.  Her updated medication list for this problem includes:    Bayer Aspirin 325 Mg Tabs (Aspirin) .Marland Kitchen... Take 1 tablet by mouth once a day    Metformin Hcl 500 Mg Xr24h-tab (Metformin hcl) ..... One by mouth two times a day before or with meals  Orders: T- Hemoglobin A1C ([address])  Labs Reviewed: Creat: 0.79 (08/08/2009)    Reviewed HgBA1c results: 6.1 (08/08/2009)  6.5 (04/26/2009)  Problem # 2:  HYPERTENSION (ICD-401.9) I urged  compliance  Her updated medication list for this problem includes:    Bystolic 5 Mg Tabs (Nebivolol hcl) .Marland Kitchen... Take 1 tablet by mouth once a day    Lasix 40 Mg Tabs (Furosemide) .Marland Kitchen... Take 1 tablet by mouth once a day  Orders: T-Basic Metabolic Panel [phone])  BP today: 134/70 Prior BP: 140/80 (08/08/2009)  Prior 10 Yr Risk Heart Disease: 11 % (07/19/2008)  Labs Reviewed: K+: 4.5 (08/08/2009) Creat: : 0.79 (08/08/2009)   Chol: 192 (07/13/2008)   HDL: 34 (07/13/2008)   LDL: 138 (07/13/2008)   TG: 98 (07/13/2008)  Problem # 3:  OBESITY, MORBID (ICD-278.01)  Orders: Psychology Referral (Psychology)  Problem # 4:  BLURRED VISION (ICD-368.8)  Orders: Ophthalmology Referral (Ophthalmology)  Complete Medication List: 1)  Bystolic 5 Mg Tabs (Nebivolol hcl) ....  Take 1 tablet by mouth once a day 2)  Lasix 40 Mg Tabs (Furosemide) .... Take 1 tablet by mouth once a day 3)  Omeprazole 20 Mg Cpdr (Omeprazole) .... One by mouth qam 30 mins before meals daily 4)  Klor-con M10 10 Meq Cr-tabs (Potassium chloride crys cr) .... Take 1 tablet by mouth once a day 5)  Bayer Aspirin 325 Mg Tabs (Aspirin) .... Take 1 tablet by mouth once a day 6)  Metformin Hcl 500 Mg Xr24h-tab (Metformin hcl) .... One by mouth two times a day before or with meals 7)  Pravastatin Sodium 40 Mg Tabs (Pravastatin sodium) .... One by mouth qpm 8)  Flonase 50 Mcg/act Susp (Fluticasone propionate) .... 2 sprays each nostril qd 9)  Accu-chek Aviva Strp (Glucose blood) .... Use daily as directed 10)  Accu-chek Multiclix Lancets Misc (Lancets) .... Use once daily as directed 11)  Epipen 0.3 Mg/0.66ml Devi (Epinephrine) .... 0.3 mg im x 1 ( for severe allergic rxn )  Other Orders: Diabetic Clinic Referral (Diabetic)  Patient Instructions: 1)  Our office will contact you re:  counseling for eating disorder 2)  -  eye doctor referral 3)  -  nutritionist referral 4)  Please schedule a follow-up appointment in 2 months. Prescriptions: PRAVASTATIN SODIUM 40 MG TABS (PRAVASTATIN SODIUM) one by mouth qpm  #30 x 2   Entered and Authorized by:   D. Thomos Lemons DO   Signed by:   D. Thomos Lemons DO on 03/10/2010   Method used:   Print then Give to Patient   RxID:   [id number] METFORMIN HCL 500 MG XR24H-TAB (METFORMIN HCL) one by mouth two times a day before or with meals  #60 x 2   Entered and Authorized by:   D. Thomos Lemons DO   Signed by:   D. Thomos Lemons DO on 03/10/2010   Method used:   Print then Give to Patient   RxID:   [id number] KLOR-CON M10 10 MEQ CR-TABS (POTASSIUM CHLORIDE CRYS CR) Take 1 tablet by mouth once a day  #30 x 2   Entered and Authorized by:   D. Thomos Lemons DO   Signed by:   D. Thomos Lemons DO on 03/10/2010   Method used:   Print then Give to Patient   RxID:    [id number] LASIX 40 MG TABS (FUROSEMIDE) Take 1 tablet by mouth once a day  #30 Tablet x 2   Entered and Authorized by:   D. Thomos Lemons DO   Signed by:   D. Thomos Lemons DO on 03/10/2010   Method used:   Print then Give to Patient   RxID:   [id number] BYSTOLIC 5  MG TABS (NEBIVOLOL HCL) Take 1 tablet by mouth once a day  #30 x 2   Entered and Authorized by:   D. Thomos Lemons DO   Signed by:   D. Thomos Lemons DO on 03/10/2010   Method used:   Print then Give to Patient   RxID:   [id number]    Orders Added: 1)  T-Basic Metabolic Panel [80048-22910] 2)  T- Hemoglobin A1C [83036-23375] 3)  Ophthalmology Referral [Ophthalmology] 4)  Diabetic Clinic Referral [Diabetic] 5)  Psychology Referral [Psychology] 6)  Est. Patient Level III [[address]]   Immunization History:  Influenza Immunization History:    Influenza:  declined (03/10/2010)   Contraindications/Deferment of Procedures/Staging:    Test/Procedure: FLU VAX    Reason for deferment: patient declined   Immunization History:  Influenza Immunization History:    Influenza:  Declined (03/10/2010)  Current Allergies (reviewed today): ! SULFA    Preventive Care Screening  Mammogram:    Date:  03/10/2010    Results:  due  Pap Smear:    Date:  03/10/2010    Results:  due  Last Flu Shot:    Date:  03/10/2010    Results:  Declined   Laboratory Results   Blood Tests    Date/Time Reported: Mervin Kung CMA (AAMA)  March 10, 2010 2:10 PM   CBG Random:: 102mg /dL

## 2010-05-11 NOTE — Progress Notes (Signed)
Summary: Xray Results  Phone Note Outgoing Call   Summary of Call: call pt - x ray of left hip and neck negative for acute process.  mild arthritic changes Initial call taken by: D. Thomos Lemons DO,  April 20, 2010 5:33 PM  Follow-up for Phone Call        call placed to patient at [phone], she has been advised per Dr Artist Pais instructions. Follow-up by: Glendell Docker CMA,  April 21, 2010 2:02 PM

## 2010-05-11 NOTE — Letter (Signed)
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           Sincerely,    Glendell Docker CMA Dr. Thomos Lemons

## 2010-05-12 ENCOUNTER — Ambulatory Visit: Payer: Self-pay | Admitting: Internal Medicine

## 2010-05-17 NOTE — Progress Notes (Signed)
Summary: pt unable to have MRI  Phone Note From Other Clinic   Caller: [facility] Call For: dr. Artist Pais Details for Reason: failed to get MRI Summary of Call: Per Steward Drone at Radiology. Pt unable to have MRI due to claustrophobia. Sedation was tried.  Initial call taken by: Elba Barman,  May 04, 2010 9:36 AM  Follow-up for Phone Call        plz let her orhtopedic physician know MRI could not be completed Follow-up by: D. Thomos Lemons DO,  May 08, 2010 4:28 PM  Additional Follow-up for Phone Call Additional follow up Details #1::         call placed to [facility] Dr. Frederico Hamman [phone], soke with Lupita Leash. She has been informed per Dr Artist Pais instructions Additional Follow-up by: Glendell Docker CMA,  May 09, 2010 9:07 AM

## 2010-05-17 NOTE — Assessment & Plan Note (Signed)
Summary: Needs meds for MRI/ hurting /hea   Vital Signs:  Patient profile:   45 year old female Height:      64 inches O2 Sat:      100 % on Room air Temp:     98.0 degrees F oral Pulse rate:   63 / minute Resp:     18 per minute BP sitting:   130 / 80  (left arm) Cuff size:   large  Vitals Entered By: Glendell Docker CMA (April 24, 2010 2:23 PM)  O2 Flow:  Room air CC: follow-up visit Is Patient Diabetic? Yes Pain Assessment Patient in pain? no        Primary Care Provider:  Dondra Spry DO  CC:  follow-up visit.  History of Present Illness: 45 y/o AA female for follow up   hips are still hurting. having difficulty with climbing steps  she canceled appt with ortho she requests referral to Dr. Cristela Felt in [place]  pt still having left sided neck pain she also is experiencing sharp pain in mid upper thoracic area when she lifts her arms over her shoulder  whole body still feels stiff  Preventive Screening-Counseling & Management  Alcohol-Tobacco     Smoking Status: never  Allergies: 1)  ! Sulfa  Past History:  Past Medical History:  1. Palpitations      --event monito 3/10:   2. Chest pain.            --negative Myoview March 2009.   3. Hypertension, controlled.   4. Obstructive sleep apnea, mild    5. Obesity.    6. Hx of left leg lipoma - S/P excision   7.  rectal bleeding - normal colonoscopy in 08/2008 - Dr. Arlyce Dice  Past Surgical History: Hysterectomy        Family History: Family History of CAD Female 1st degree relative <60 Family History of CAD Female 1st degree relative <50 Family History of Stroke M 1st degree relative <50     allergies: daughter, sister asthma: daughter heart disease: mother (m.i.) father (m.i.) paternal grandfather (m.i. & stroke)  rheumatism: mother, maternal grandmother cancer: maternal grandfather (prostate)         Social History: Occupation: works at [employer] daughter went to  college Married 3 children  Never Smoked   Alcohol use-yes (occasional)     Physical Exam  General:  alert, well-developed, and well-nourished.   Eyes:  pupils equal, pupils round, and pupils reactive to light.   Ears:  R ear normal and L ear normal.  R ear normal and L ear normal.   Mouth:  pharynx pink and moist.  pharynx pink and moist.   Lungs:  normal respiratory effort, normal breath sounds, no crackles, and no wheezes.   Heart:  normal rate, regular rhythm, and no gallop.   Neurologic:  cranial nerves II-XII intact, gait normal, and DTRs symmetrical and normal.    bilateral upper ext weakness Psych:  normally interactive and good eye contact.     Impression & Recommendations:  Problem # 1:  NECK PAIN, LEFT (ICD-723.1) Assessment Unchanged Pt with persistent neck pain.  She is having difficulty lifting.  pt could not complete MRI of C spine due to severe claustrophobia. try to arrange MRI in hospital setting with sedation probable neck strain.  rule out disc rupture.  rule out spinal stenosis  Her updated medication list for this problem includes:    Bayer Aspirin 325 Mg Tabs (Aspirin) .Marland KitchenMarland KitchenMarland KitchenMarland Kitchen  Take 1 tablet by mouth once a day    Cyclobenzaprine Hcl 5 Mg Tabs (Cyclobenzaprine hcl) ..... One by mouth two times a day prn  Orders: Radiology Referral (Radiology)  Problem # 2:  BACK PAIN, THORACIC REGION (ICD-724.1) Assessment: New pt describes sharp pain mid upper thoracic spine with lifting arms above shoulders rule out neural compression or thoracic disc disease add MRI of thoracic spine  Her updated medication list for this problem includes:    Bayer Aspirin 325 Mg Tabs (Aspirin) .Marland Kitchen... Take 1 tablet by mouth once a day    Cyclobenzaprine Hcl 5 Mg Tabs (Cyclobenzaprine hcl) ..... One by mouth two times a day prn  Orders: Radiology Referral (Radiology)  Complete Medication List: 1)  Bystolic 5 Mg Tabs (Nebivolol hcl) .... Take 1 tablet by mouth once a day 2)  Lasix  40 Mg Tabs (Furosemide) .... Take 1 tablet by mouth once a day 3)  Omeprazole 20 Mg Cpdr (Omeprazole) .... One by mouth qam 30 mins before meals daily 4)  Klor-con M10 10 Meq Cr-tabs (Potassium chloride crys cr) .... Take 1 tablet by mouth once a day 5)  Bayer Aspirin 325 Mg Tabs (Aspirin) .... Take 1 tablet by mouth once a day 6)  Metformin Hcl 500 Mg Xr24h-tab (Metformin hcl) .... One by mouth two times a day before or with meals 7)  Pravastatin Sodium 40 Mg Tabs (Pravastatin sodium) .... One by mouth qpm 8)  Flonase 50 Mcg/act Susp (Fluticasone propionate) .... 2 sprays each nostril qd 9)  Accu-chek Aviva Strp (Glucose blood) .... Use daily as directed 10)  Accu-chek Multiclix Lancets Misc (Lancets) .... Use once daily as directed 11)  Epipen 0.3 Mg/0.62ml Devi (Epinephrine) .... 0.3 mg im x 1 ( for severe allergic rxn ) 12)  Cyclobenzaprine Hcl 5 Mg Tabs (Cyclobenzaprine hcl) .... One by mouth two times a day prn  Patient Instructions: 1)  Out of work until next week.  2)  Please schedule a follow-up appointment in 1 week.   Orders Added: 1)  Radiology Referral [Radiology] 2)  Est. Patient Level III [11914]    Current Allergies (reviewed today): ! SULFA

## 2010-05-25 NOTE — Consult Note (Signed)
Summary: [facility]  [facility]   Imported By: Lanelle Bal 05/19/2010 10:36:46  _____________________________________________________________________  External Attachment:    Type:   Image     Comment:   External Document

## 2010-06-26 LAB — URINALYSIS, ROUTINE W REFLEX MICROSCOPIC
Glucose, UA: NEGATIVE mg/dL
Ketones, ur: NEGATIVE mg/dL
Nitrite: NEGATIVE
Protein, ur: NEGATIVE mg/dL
Urobilinogen, UA: 0.2 mg/dL (ref 0.0–1.0)

## 2010-06-26 LAB — WET PREP, GENITAL: Yeast Wet Prep HPF POC: NONE SEEN

## 2010-07-18 LAB — GLUCOSE, CAPILLARY: Glucose-Capillary: 94 mg/dL (ref 70–99)

## 2010-08-22 NOTE — Assessment & Plan Note (Signed)
[facility]                            CARDIOLOGY OFFICE NOTE   [name], [name]                       MRN:          [id number]  DATE:06/02/2008                            DOB:          02-11-1966    PRIMARY CARDIOLOGIST:  Victoria Buckles. Bensimhon, MD   PRIMARY CARE PHYSICIAN:  Victoria Hair. Artist Pais, DO   [name] is a 45 year old African American female patient who has seen  Dr. Gala Morse in the past for chest pain.  Outpatient Myoview showed no  ischemia, EF of 75%.  She also has a history of hypertension and  obstructive sleep apnea.  She is referred to [place] today by Dr. Artist Morse for  evaluation of palpitations.  She says when she was in his office her  heart was fluttering.  She says this happens off and on all the time,  but it is most noticeable when she lays down.  She does not notice this  much when she is up and around.  She has no associated dizziness or  chest pain with this.  She does complain of some chest pain and left arm  pain that is off and on all day long.  She was recently evaluated by Dr.  Elijah Morse in [place] and had an echo and stress test done.  She was  told they were normal.  We are trying to obtain these results.  Cardiac  risk factors are significant for hypertension.  Her mother and father  both had heart disease in their 73s.  Her mother had a stent, father had  an MI and mini strokes.  She has no history of hyperlipidemia, diabetes,  or/and she does not smoke.  She is obese and needs to lose weight.  She  is not exercising regularly.   CURRENT MEDICATIONS:  1. Atenolol 50 mg daily.  2. Lasix 40 mg daily.   PHYSICAL EXAMINATION:  GENERAL:  This is a pleasant, obese, 45 year old  African American female in no acute distress.  VITAL SIGNS:  Blood pressure 122/77, pulse 58, and weight 304 pounds.  NECK:  Without JVD, HJR, bruit, or thyroid enlargement.  LUNGS:  Clear anterior, posterior, and lateral.  HEART:  Regular rate and  rhythm at 58 beats per minute.  Normal S1 and  S2.  No murmur, rub, bruit, thrill, or heave noted.  ABDOMEN:  Obese,  soft without organomegaly, masses, lesions, or abnormal tenderness.  EXTREMITIES:  She has a scar in her left lower leg with some edema from  her prior surgery.  Decreased distal pulses.  NEUROLOGICAL:  Alert and  oriented x3.  EKG sinus bradycardia with nonspecific ST elevation  consistent with early repolarization, no change from prior tracings.   IMPRESSION:  1. Palpitations, rule out arrhythmia.  2. Chest pain.  Recent workup by a cardiologist in [place] with a      stress test and echo said to be negative, trying to obtain those      results, negative Myoview here in March 2009.  3. Hypertension, controlled.  4. Obstructive sleep apnea, awaiting to have sleep study.  5. Obesity.   PLAN:  We will obtain a recorder on this patient to rule out arrhythmia.  I have encouraged her to join a weight loss program such as Weight  Watchers and told her the importance of her losing weight and we will  review her records when we obtain them from [name] office.  The  patient will follow up with Dr. Gala Morse in 1 month.   ADDENDUM:  The patient does not use regular caffeine.      Jacolyn Reedy, PA-C  Electronically Signed      Luis Abed, MD, [facility]  Electronically Signed   ML/MedQ  DD: 06/02/2008  DT: 06/03/2008  Job #: [id number]

## 2010-08-22 NOTE — Discharge Summary (Signed)
NAMEDONISHA, HOCH NO.:  [id number]   MEDICAL RECORD NO.:  [id number]          PATIENT TYPE:  OBV   LOCATION:  6527                         FACILITY:  [facility]   PHYSICIAN:  Michaelyn Barter, M.D. DATE OF BIRTH:  1966-04-05   DATE OF ADMISSION:  05/26/2007  DATE OF DISCHARGE:  05/28/2007                               DISCHARGE SUMMARY   The patient primary care doctor is unassigned.   FINAL DIAGNOSES:  1. Chest pain  2. Sinusitis.  3. Bradycardia.  4. Constipation.   CONSULTATIONS:  [place] [facility] with Dr. Arvilla Meres.   PROCEDURES:  Portable chest x-ray completed May 26, 2007.   HISTORY OF PRESENT ILLNESS:  Ms. Ratliff is a 45 year old female with a  past medical history of hypertension who came in complaining of chest  pain.  She indicated that she had been sitting down when her symptoms  first started.  It was described as centrally located, approximately 8-  9/10 in intensity, lasting approximately 1 hour.  She described the pain  as a burning sensation that radiated to the left side of the chest as  well as her shoulder and arm.  Nausea, diaphoresis and shortness of  breath accompanied this pain.  For past medical history, please see that  dictated by Dr. Oneita Hurt.   HOSPITAL COURSE:  1. Chest pain.  A chest x-ray was done February 16 which revealed no      active lung disease.  The patient's cardiac markers revealed a      troponin I of less than 0.01 x2.  A third troponin was 0.01.  The      patient's CK-MBs were 2.3, 1.8 and 1.6.  Therefore, the patient      ruled out for cardiac event via cardiac markers.  Her EKG revealed      sinus bradycardia with no obvious Q-waves or significant ST-segment      abnormalities.  Over the course of her hospitalization, she did      complain of some chest discomfort which was very atypical in      description.  She stated that the that the discomfort was      underneath the left breast and  typically involved the left axilla      area.  She indicated that she had some nasal drainage which      appeared to be related to the chest discomfort.  She also stated      that urinating helped to alleviate her chest discomfort.  [facility]      [facility] has seen the patient during this hospitalization.  Dr.      Gala Romney indicated that the patient's chest pain was atypical.  He      suspected that there was a GI component plus or minus current chest      wall pain.  She had no obvious evidence of ischemia.  He      recommended that a proton pump inhibitor be provided, the patient      to lose weight, and that he would arrange for the patient to have  an outpatient Myoview.  He also stated that he was okay to      discharge the patient from his perspective.  2. Probable sinusitis.  The patient indicated that she was having some      sinus drainage, and she requested that an antibiotic be started.      Augmentin was provided.  3. Bradycardia.  The patient did have an episode of bradycardia during      the course of his hospitalization.  She is currently taking      atenolol which may be contributing to her bradycardia.  She had      been otherwise asymptomatic.  4. Hypertension.  The patient's blood pressure has been relatively      controlled during the course of hospitalization.   CONDITION AT THE TIME OF DISCHARGE:  Currently, the patient has no new  complaints.  A decision has been made to discharge the patient from the  hospital.   She will be discharged home on:  1. Augmentin 875 mg p.o. q.12h.  2. Aspirin 325 mg p.o. daily.  3. Atenolol 25 mg p.o. daily.  4. Furosemide 40 mg p.o. daily.  5. Hydrochlorothiazide 25 mg daily.  6. Protonix 40 mg p.o. daily.  7. Senokot S 1 tablet p.o. daily.  8. Darvocet 1-2 tablets p.o. daily.   She will be told to call her primary care doctor for follow-up within 2-  4 weeks, and  cardiology will arrange for outpatient stress  test.      Michaelyn Barter, M.D.  Electronically Signed     OR/MEDQ  D:  05/28/2007  T:  05/29/2007  Job:  [id number]

## 2010-08-22 NOTE — H&P (Signed)
[name], [name] NO.:  [id number]   MEDICAL RECORD NO.:  [id number]          PATIENT TYPE:  INP   LOCATION:  1827                         FACILITY:  [facility]   PHYSICIAN:  Madaline Savage, MD        DATE OF BIRTH:  01/16/1966   DATE OF ADMISSION:  05/26/2007  DATE OF DISCHARGE:                              HISTORY & PHYSICAL   CHIEF COMPLAINT:  Chest pain.   HISTORY OF PRESENT ILLNESS:  [name] is a 45 year old lady with a  history of hypertension and family history of coronary artery disease  who comes in with chest pain.  She states the chest pain came on while  she was sitting in church.  The chest pain came on suddenly and it was  central.  She rated it an 8-9/10 and lasted approximately 1 hour, after  which it eased off.  At this time, her pain is almost gone.  She says  the pain was a burning type pain.  It radiated to the left side of her  chest and her left shoulder and left arm.  She says she had associated  nausea, diaphoresis and shortness of breath with the chest pain.  At  this time, the pain is almost resolved.  She denies any discomfort at  this time.   PAST MEDICAL HISTORY:  Hypertension.   PAST SURGICAL HISTORY:  1. Hysterectomy.  2. Lipoma removed in the past.   ALLERGIES:  SULFA.   CURRENT MEDICATIONS:  1. Lasix 40 mg daily.  2. Hydrochlorothiazide 25 mg daily.  3. Atenolol 50 mg daily.   SOCIAL HISTORY:  She denies any history of smoking.  She takes alcohol  occasionally.  Denies any history of drug abuse.   FAMILY HISTORY:  Both her parents are in their 38s at this time.  Her  father had a heart attack when he was in his early 30s.  Her mother was  found to have a blockage in her coronary arteries 2 years ago.   REVIEW OF SYSTEMS:  CONSTITUTIONAL:  She denies any recent weight loss,  weight gain, fevers, chills, headaches, blurred vision or sore throat.  CARDIOVASCULAR:  Denies palpitations, but does have chest pain.  RESPIRATORY:  She did have some shortness of breath.  No cough.  GI:  No  abdominal pain, nausea, vomiting, diarrhea or constipation.   PHYSICAL EXAMINATION:  GENERAL:  She is well-developed, well-nourished  and oriented x3.  VITAL SIGNS:  Temperature 98.1, pulse rate 70, blood pressure 130/73,  respirations 18, oxygen saturations 100% on 2 L.  HEENT:  Head atraumatic, normocephalic.  Pupils equal round and reactive  to light.  Mucous membranes moist.  NECK:  Supple.  No JVD, no carotid bruit.  CARDIAC:  S1, S2, regular rate and rhythm.  CHEST:  Clear to auscultation bilaterally.  ABDOMEN:  Soft, bowel sounds heard.  EXTREMITIES:  No clubbing, cyanosis or edema.   LABORATORY DATA AND X-RAY FINDINGS:  White count 10.9, hemoglobin 33,  platelets 277.  Sodium 137, potassium 3.9, BUN 9, creatinine 1.0.  Troponins have  been less than 0.05 at times with 2 sets.  Her CK-MB was  1.0 and 1.1.  Her D-dimer is 0.3.  Urinalysis is negative.  X-ray of  chest showed no acute abnormalities.   IMPRESSION:  1. Chest pain to rule out an acute coronary syndrome.  2. Hypertension.   PLAN:  This is a 45 year old with family history of acute coronary  syndrome and history of hypertension who comes in with chest pain.  Her  chest pain has more atypical features at this time and her chest pain  has resolved.  We will admit her to rule out acute coronary syndrome.  We will cycle her cardiac markers and follow upon her EKG.  I will also  get a fasting lipid profile and TSH on her.  She does not have a primary  care doctor and will probably need stress test for risk stratification  as she has a strong family history of coronary artery disease.  I will  also put her on DVT prophylaxis.      Madaline Savage, MD  Electronically Signed     PKN/MEDQ  D:  05/26/2007  T:  05/26/2007  Job:  [id number]

## 2010-08-22 NOTE — Procedures (Signed)
[name], [name] NO.:  [id number]   MEDICAL RECORD NO.:  [id number]          PATIENT TYPE:  OUT   LOCATION:  SLEEP CENTER                 FACILITY:  [facility]   PHYSICIAN:  Barbaraann Share, MD,FCCPDATE OF BIRTH:  03-29-1966   DATE OF STUDY:  06/24/2008                            NOCTURNAL POLYSOMNOGRAM   REFERRING PHYSICIAN:  Barbaraann Share, MD,FCCP   LOCATION:  Sleep Lab.   INDICATION FOR STUDY:  Hypersomnia with sleep apnea.   EPWORTH SCORE:  24.   SLEEP ARCHITECTURE:  The patient had total sleep time of 306 minutes  with no slow wave sleep and only 89 minutes of REM.  Sleep onset latency  was normal at 11 minutes, and REM onset was prolonged at 120 minutes.  Sleep efficiency was poor at 74%.   RESPIRATORY DATA:  The patient had no obstructive apneas, but 18  obstructive hypopneas for an AHI of 4 events per hour.  She was also  noted to have 18 RERAs i.e. respiratory effort related arousals, giving  her a RDI of 7 events per hour.  Very loud snoring was noted throughout.  There was no increase in events in the supine position.   OXYGEN DATA:  There was O2 desaturation as low as 74%.   CARDIAC DATA:  No clinically significant arrhythmias were noted.   MOVEMENT/PARASOMNIA:  The patient had no significant leg jerks or  abnormal behavior seen.   IMPRESSION/RECOMMENDATIONS:  Mild obstructive sleep apnea/hypopnea  syndrome with an AHI of 4 events per hour, and a RDI of 7 events per  hour.  Treatment for this degree of sleep apnea can include a trial of  weight loss alone if applicable, upper airway surgery, oral appliance,  and also CPAP.  Clinical correlation is suggested.      Barbaraann Share, MD,FCCP  Diplomate, American Board of Sleep  Medicine  Electronically Signed    [facility]/MEDQ  D:  07/03/2008 12:09:13  T:  07/04/2008 13:08:65  Job:  [id number]

## 2010-08-22 NOTE — Consult Note (Signed)
[name], ZEIMET NO.:  [id number]   MEDICAL RECORD NO.:  [id number]          PATIENT TYPE:  OBV   LOCATION:  6527                         FACILITY:  [facility]   PHYSICIAN:  Bevelyn Buckles. Bensimhon, MDDATE OF BIRTH:  10-11-1965   DATE OF CONSULTATION:  05/26/2007  DATE OF DISCHARGE:                                 CONSULTATION   PRIMARY CARE PHYSICIAN:  None.  She will be new to [employer]. Artist Pais, DO.   CONSULTING PHYSICIAN:  Michaelyn Barter, M.D.   REASON FOR CONSULTATION:  Chest and arm pain.   HISTORY OF PRESENT ILLNESS:  [name] is a very pleasant 45 year old  woman with history of morbid obesity,  hypertension, and probably  obstructive sleep apnea.  She denies any history of known heart disease.  She has never had a stress test or cardiac catheterization.   She says over the past several weeks, she has noticed squeezing-type  sensation in her chest which is worse after she eats.  She was in church  on Sunday when she experienced symptoms again with radiation up under  her breast and down her left arm.  It did come with some shortness of  breath, so she presented to the emergency room. She had several sets of  normal cardiac markers with a troponin of less than 0.05.  D-dimer was  negative.  EKG was not acute.   She is not very active but denies any exertional chest pain.  She does  note that she has significant lower extremity edema,  particularly in  her left leg where she has had a lipoma removed 2 times.  She also notes  some orthopnea.   REVIEW OF SYSTEMS:  Notable for frequent burping.  She denies fevers and  chills.  No bright red blood per rectum, no melena.  She has not had any  palpitations, no focal neurologic symptoms.  Remainder of Review of  Systems is negative except for HPI and Problem List.   PROBLEM LIST:  1. Morbid obesity.  2. Hypertension.  3. Probable obstructive sleep apnea.  4. History of left lower extremity lipoma status post  resection x2.  5. Status post hysterectomy.   CURRENT MEDICATIONS:  1. Lasix 40 mg a day.  2. Hydrochlorothiazide 25 mg a day.  3. Atenolol 50 a day.   ALLERGIES:  SULFA.   SOCIAL HISTORY:  She has 3 daughters and 2 grandchildren.  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  She denies any tobacco or  alcohol use.   FAMILY HISTORY:  Mother is alive.  She has a history of coronary artery  disease with a stent at age 45.  Her father apparently has a history of  coronary artery disease with myocardial infarction at age 1.  He also  had a stroke.   PHYSICAL EXAMINATION:  GENERAL:  She is sitting in bed in no acute  distress.  Ambulates around the room without respiratory difficulty.  VITAL SIGNS:  Blood pressure 122/52, heart rate 55.  She is saturating  100% on room air.  She is afebrile.  HEENT:  Normal.  NECK:  Thick and supple.  No obvious JVD.  Carotids are 2+ bilaterally  without any bruits.  There is no lymphadenopathy or thyromegaly.  CARDIAC:  Regular rate and rhythm.  No murmurs, rubs, or gallops.  PMI  is not palpable.  She has marked soreness to palpation over the left  side of her sternum and under her left breast.  This reproduces her  pain.  LUNGS:  Clear.  ABDOMEN:  Morbidly obese, nontender, nondistended.  No obvious  hepatosplenomegaly.  No bruits, no masses.  Good bowel sounds.  EXTREMITIES:  Warm with no cyanosis or clubbing.  She is status post  resection of a lipoma on her left lower extremity.  There is no edema,  no rash.  NEUROLOGIC:  Alert and oriented x3.  Cranial nerves II-XII intact.  Moves all four extremities without difficulty.  Affect is pleasant.   EKG shows sinus bradycardia at a rate of 50.  There is poor R wave  progression across the anterior precordium, but there is no ST-T wave  abnormalities.  Mild early repolarization.   Cardiac markers:  MB 2.3 and 1.8.  Troponin less than 0.01.   Chest x-ray was normal.   Labs show  sodium 137, potassium 3.9, BUN 9, creatinine 1.0.  White count  10, hemoglobin 15, platelets 277.  LDL 127, total cholesterol 171,  triglycerides 87, HDL 27.   ASSESSMENT AND PLAN:  Chest pain.  This is quite atypical.  I suspect it  is either related to gastrointestinal or chest wall pain.  Would  recommend treatment with a proton pump inhibitor and nonsteroidals.  Given her family history,  I do think it is reasonable to proceed with  an outpatient Myoview for risk stratification.   I think she also needs a sleep study to further evaluate sleep apnea.  I  also discussed with her the need for diet and exercise program for  weight loss as she apparently has metabolic syndrome and is at high risk  for diabetes and coronary events down the road.   DISPOSITION:  From our standpoint, she is suitable for discharge from a  cardiac perspective.  We will arrange for her to have an outpatient  stress test.   We appreciate the consult.      Bevelyn Buckles. Bensimhon, MD  Electronically Signed     DRB/MEDQ  D:  05/26/2007  T:  05/26/2007  Job:  [id number]   cc:   Barbette Hair. Artist Pais, DO  Michaelyn Barter, M.D.

## 2010-08-22 NOTE — Assessment & Plan Note (Signed)
[name]                            CARDIOLOGY OFFICE NOTE   [name], [name]                       MRN:          [id number]  DATE:06/20/2007                            DOB:          12-05-1965    PRIMARY CARE PHYSICIAN:  [name].   INTERVAL HISTORY:  [name] is a very pleasant 45 year old woman with  a history of hypertension, obesity and obstructive sleep apnea.  She was  recently admitted to the hospital with atypical chest pain.  She ruled  out for myocardial infarction with serial cardiac markers.  She then  underwent an outpatient Myoview which showed an ejection fraction of 75%  with no evidence of ischemia.  She returns today for routine followup.  She says she is no longer having any chest pain or shortness of breath.  She feels great.   CURRENT MEDICATIONS:  1. Hydrochlorothiazide 25 a day.  2. Atenolol 50 a day.  3. Lasix 40 a day.   PHYSICAL EXAMINATION:  GENERAL:  She is in no acute distress.  She  ambulates around the clinic without any respiratory difficulty.  VITAL SIGNS:  Blood pressure is 136/86 (she did not take her medications  this morning), heart rate 64 and weight is 288.  HEENT:  Normal.  NECK:  Supple.  No obvious JVD.  Carotids are 2+ bilaterally without  bruits.  There is no lymphadenopathy or thyromegaly.  CARDIAC:  PMI is not palpable.  She has a regular rate and rhythm with  no murmurs, rubs or gallops.  LUNGS:  Clear.  ABDOMEN:  Obese, nontender, nondistended.  No hepatosplenomegaly, no  bruits, no masses.  Good bowel sounds.  EXTREMITIES:  Warm with no cyanosis, clubbing or edema.  No rash.  NEURO:  Alert and oriented x3.  Cranial nerves II-XII are intact.  Moves  all 4 extremities  without difficulty.  Affect is pleasant.   ASSESSMENT/PLAN:  1. Atypical chest pain.  A Myoview is normal.  It is quite reassuring.      I doubt this is cardiac in nature.  2. Chronic hypertension.  Blood  pressure continues to be mildly      elevated.  She will follow up with Dr. Artist Pais.   DISPOSITION:  She will return on a p.r.n. basis.     Victoria Buckles. Bensimhon, MD  Electronically Signed    DRB/MedQ  DD: 06/20/2007  DT: 06/21/2007  Job #: [id number]   cc:   [name]

## 2010-09-22 ENCOUNTER — Encounter: Payer: Self-pay | Admitting: Cardiovascular Disease

## 2010-10-16 ENCOUNTER — Telehealth: Payer: Self-pay | Admitting: Internal Medicine

## 2010-10-16 NOTE — Telephone Encounter (Signed)
Refill- lasix 40mg  tablet. Take one tablet by mouth once a day. Qty 30. Last fill 6.24.12

## 2010-10-17 MED ORDER — FUROSEMIDE 40 MG PO TABS: 40.0000 mg | ORAL_TABLET | Freq: Every day | ORAL | Status: DC

## 2010-10-17 NOTE — Telephone Encounter (Signed)
30 day supply sent to pharm with note that pt needs follow up appt.  I have attempted to contact this patient by phone with the following results:  Automated voice messasge that the number or code entered is incorrect.   Unable to leave message or speak with patient.

## 2010-11-03 ENCOUNTER — Telehealth: Payer: Self-pay | Admitting: Internal Medicine

## 2010-11-03 NOTE — Telephone Encounter (Signed)
Rx refill denied office visit needed. 

## 2010-11-03 NOTE — Telephone Encounter (Signed)
Refill-bystolic 5mg  tablet. Take one tablet by mouth once a day. Qty 30. Last fill 7.8.12

## 2010-11-06 ENCOUNTER — Encounter: Payer: Self-pay | Admitting: Family

## 2010-11-07 ENCOUNTER — Ambulatory Visit (INDEPENDENT_AMBULATORY_CARE_PROVIDER_SITE_OTHER): Payer: BC Managed Care – PPO | Admitting: Family

## 2010-11-07 ENCOUNTER — Encounter: Payer: Self-pay | Admitting: Family

## 2010-11-07 DIAGNOSIS — E785 Hyperlipidemia, unspecified: Secondary | ICD-10-CM

## 2010-11-07 DIAGNOSIS — E119 Type 2 diabetes mellitus without complications: Secondary | ICD-10-CM

## 2010-11-07 DIAGNOSIS — N898 Other specified noninflammatory disorders of vagina: Secondary | ICD-10-CM

## 2010-11-07 DIAGNOSIS — N760 Acute vaginitis: Secondary | ICD-10-CM

## 2010-11-07 DIAGNOSIS — I10 Essential (primary) hypertension: Secondary | ICD-10-CM

## 2010-11-07 LAB — HEMOGLOBIN A1C
Hgb A1c MFr Bld: 5.9 % — ABNORMAL HIGH (ref <5.7)
Mean Plasma Glucose: 123 mg/dL — ABNORMAL HIGH (ref <117)

## 2010-11-07 LAB — HEPATIC FUNCTION PANEL
ALT: 16 U/L (ref 0–35)
Bilirubin, Direct: <0.1 mg/dL (ref 0.0–0.3)
Total Protein: 6.9 g/dL (ref 6.0–8.3)

## 2010-11-07 LAB — BASIC METABOLIC PANEL
BUN: 14 mg/dL (ref 6–23)
Chloride: 104 mEq/L (ref 96–112)
Creat: 0.86 mg/dL (ref 0.50–1.10)

## 2010-11-07 MED ORDER — PRAVASTATIN SODIUM 40 MG PO TABS: 40.0000 mg | ORAL_TABLET | Freq: Every day | ORAL | Status: DC

## 2010-11-07 MED ORDER — FUROSEMIDE 40 MG PO TABS: 40.0000 mg | ORAL_TABLET | Freq: Every day | ORAL | Status: DC

## 2010-11-07 MED ORDER — NEBIVOLOL HCL 5 MG PO TABS: 5.0000 mg | ORAL_TABLET | Freq: Every day | ORAL | Status: DC

## 2010-11-07 MED ORDER — POTASSIUM CHLORIDE CRYS ER 10 MEQ PO TBCR: 10.0000 meq | EXTENDED_RELEASE_TABLET | Freq: Every day | ORAL | Status: DC

## 2010-11-07 MED ORDER — METFORMIN HCL ER (OSM) 500 MG PO TB24: 500.0000 mg | ORAL_TABLET | Freq: Two times a day (BID) | ORAL | Status: DC

## 2010-11-07 MED ORDER — EPINEPHRINE 0.3 MG/0.3ML IJ DEVI: 0.3000 mg | Freq: Once | INTRAMUSCULAR | Status: DC

## 2010-11-07 MED ORDER — FLUTICASONE PROPIONATE 50 MCG/ACT NA SUSP: 2.0000 | Freq: Every day | NASAL | Status: DC
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
## 2010-11-07 NOTE — Assessment & Plan Note (Signed)
Wet prep and Gc/Chlamydia samples collected today.  Plan to treat based on results.

## 2010-11-07 NOTE — Assessment & Plan Note (Signed)
Deteriorated , resume meds 

## 2010-11-07 NOTE — Patient Instructions (Signed)
Follow up in 2 months, sooner if problems or concerns.  We will contact you with the results of your vaginal swab.

## 2010-11-07 NOTE — Assessment & Plan Note (Signed)
Pt wishes to reschedule with nutrition.  Tells me that she is motivated to go this time.  Notes non-compliance with metformin.  Compliance was reinforced.

## 2010-11-08 LAB — WET PREP BY MOLECULAR PROBE
Gardnerella vaginalis: POSITIVE — AB
Trichomonas vaginosis: NEGATIVE

## 2010-11-09 ENCOUNTER — Telehealth: Payer: Self-pay | Admitting: *Deleted

## 2010-11-09 MED ORDER — METRONIDAZOLE 0.75 % VA GEL: 1.0000 | Freq: Every day | VAGINAL | Status: AC

## 2010-11-09 NOTE — Telephone Encounter (Signed)
Attempted to contact pt re: bacterial infection per 11/07/10 culture. Rx sent to pharmacy for Metrogel 1 applicator full at bedtime x 5 days per Sandford Craze, NP.  Unable to contact pt by phone, mailed contact letter to pt.

## 2010-11-10 ENCOUNTER — Other Ambulatory Visit: Payer: Self-pay | Admitting: *Deleted

## 2010-11-10 NOTE — Telephone Encounter (Signed)
Received call from pt stating the pharmacy does not have refills that we sent on 11/07/10. Spoke to [place] at [facility] and he confirmed that they did not receive 11/07/10 refills. Did receive metronidazole rx from 11/09/10. All refills from 11/07/10 were given to Molly Maduro and pt was notified. Also informed pt of culture result indicating bacterial infection and need to use Metrogel 1 applicatorful at bedtime per [facility]. Advised pt to disregard contact letter that was mailed to her on 11/09/10. Pt's phone number has been updated in the system.

## 2010-11-13 ENCOUNTER — Ambulatory Visit: Payer: BC Managed Care – PPO | Admitting: Family

## 2010-11-17 NOTE — Telephone Encounter (Signed)
Pt was notified on 11/10/10.

## 2010-12-20 ENCOUNTER — Telehealth: Payer: Self-pay | Admitting: *Deleted

## 2010-12-20 ENCOUNTER — Telehealth: Payer: Self-pay | Admitting: Internal Medicine

## 2010-12-20 NOTE — Telephone Encounter (Signed)
She has not been seen in 2 years and I have never seen the patient.  She will need OV before I can consider writing her a letter.

## 2010-12-20 NOTE — Telephone Encounter (Signed)
Patient called back stating that letter needs to state the mild temperature?

## 2010-12-20 NOTE — Telephone Encounter (Signed)
I am sorry but I do not think current temperatures would affect her BP or prediabetes.  I reviewed her last note with Dr. Shelle Iron and I do not see where it is documented that pt is using her CPAP machine. She may want to ask Dr. Shelle Iron if he would write the letter.  But I can not write a letter attesting pt uses CPAP when I do not have documentation to support it.

## 2010-12-20 NOTE — Telephone Encounter (Signed)
Spoke to pt, she states her daughter, Ariah Mower (patient) has asthma, lives at home and needs the cooler temperatures. Please advise.

## 2010-12-20 NOTE — Telephone Encounter (Signed)
Received call from pt requesting a letter re: her medical need to keep her house cool.  Pt reports that social services pays her electricity bill and they are requiring documentation from [place] stating that patient has a medical condition that requires her to use cooling in the home in hot and mild temperatures. Pt states her daughter is also a patient here and has asthma as well. She requests letter be faxed to Attn: Cordelia Pen  Fax # [phone]   Ph) [phone]  Pt needs letter faxed today. Please advise.

## 2010-12-20 NOTE — Telephone Encounter (Signed)
Advised pt that per Melissa, there was not a qualifying diagnosis of a medical condition that would support the letter being requested on the pt's behalf. Advised pt that her daughter would need to be seen before a letter could be completed on behalf of her medical condition.  Pt became very upset stating her blood pressure rises when she is exposed to a hot environment, then the call was disconnected.
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
## 2010-12-21 NOTE — Telephone Encounter (Signed)
Notified pt. 

## 2010-12-21 NOTE — Telephone Encounter (Signed)
I have not seen pt since 2010, and she never followed up with me after starting cpap.  I have no idea if she is still wearing cpap.  She will need to make an apptm for next week for f/u, and we will get dme to get a compliance download from her machine to verify she is still using nightly.

## 2010-12-21 NOTE — Telephone Encounter (Signed)
LMTCB

## 2010-12-22 NOTE — Telephone Encounter (Signed)
I spoke with the pt and she states that she spoke with [employer] and they delayed cutting her power off due to her cpap use, so she does not need the letter. I advised that she needs to set an appt because she was last seen in 2010. She states she will call back to set appt. Carron Curie, CMA

## 2010-12-29 LAB — I-STAT 8, (EC8 V) (CONVERTED LAB)
Acid-base deficit: 3 — ABNORMAL HIGH
Bicarbonate: 22.9
TCO2: 24
pCO2, Ven: 41.3 — ABNORMAL LOW
pH, Ven: 7.352 — ABNORMAL HIGH

## 2010-12-29 LAB — POCT CARDIAC MARKERS
CKMB, poc: 1.1
Operator id: 282201
Troponin i, poc: <0.05
Troponin i, poc: <0.05

## 2010-12-29 LAB — CARDIAC PANEL(CRET KIN+CKTOT+MB+TROPI)
CK, MB: 1.6
CK, MB: 1.8
Relative Index: 1.2
Total CK: 146

## 2010-12-29 LAB — CBC
Hemoglobin: 13.3
RBC: 4.67
WBC: 10.9 — ABNORMAL HIGH

## 2010-12-29 LAB — URINALYSIS, ROUTINE W REFLEX MICROSCOPIC
Glucose, UA: NEGATIVE
Ketones, ur: NEGATIVE
pH: 6.5

## 2010-12-29 LAB — CK TOTAL AND CKMB (NOT AT ARMC)
CK, MB: 2.3
Total CK: 179 — ABNORMAL HIGH

## 2010-12-29 LAB — PROTIME-INR
INR: 1
Prothrombin Time: 12.9

## 2010-12-29 LAB — POCT PREGNANCY, URINE
Operator id: 282201
Preg Test, Ur: NEGATIVE

## 2010-12-29 LAB — B-NATRIURETIC PEPTIDE (CONVERTED LAB): Pro B Natriuretic peptide (BNP): <30

## 2010-12-29 LAB — DIFFERENTIAL
Lymphocytes Relative: 27
Monocytes Absolute: 0.6
Monocytes Relative: 6
Neutro Abs: 7.1

## 2010-12-29 LAB — POCT I-STAT CREATININE
Creatinine, Ser: 1
Operator id: 282201

## 2010-12-29 LAB — LIPID PANEL: HDL: 27 — ABNORMAL LOW

## 2011-01-03 ENCOUNTER — Ambulatory Visit: Payer: BC Managed Care – PPO | Admitting: Family

## 2011-01-19 LAB — GC/CHLAMYDIA PROBE AMP, GENITAL
Chlamydia, DNA Probe: NEGATIVE
GC Probe Amp, Genital: NEGATIVE

## 2011-01-19 LAB — POCT URINALYSIS DIP (DEVICE)
Ketones, ur: NEGATIVE
Nitrite: NEGATIVE
Operator id: 116391
Protein, ur: NEGATIVE
Urobilinogen, UA: 1

## 2011-01-19 LAB — WET PREP, GENITAL: Clue Cells Wet Prep HPF POC: NONE SEEN

## 2011-09-02 ENCOUNTER — Emergency Department: Payer: Self-pay | Admitting: Emergency Medicine

## 2011-09-04 ENCOUNTER — Emergency Department: Payer: Self-pay | Admitting: Emergency Medicine

## 2011-10-07 ENCOUNTER — Emergency Department: Payer: Self-pay | Admitting: *Deleted

## 2011-10-07 LAB — URINALYSIS, COMPLETE
Bacteria: NONE SEEN
Bilirubin,UR: NEGATIVE
Blood: NEGATIVE
Glucose,UR: NEGATIVE mg/dL (ref 0–75)
Ketone: NEGATIVE
Leukocyte Esterase: NEGATIVE
Nitrite: NEGATIVE
Ph: 6 (ref 4.5–8.0)
Protein: NEGATIVE
RBC,UR: <1 /HPF (ref 0–5)
Specific Gravity: 1.008 (ref 1.003–1.030)
Squamous Epithelial: <1
WBC UR: 2 /HPF (ref 0–5)

## 2011-10-07 LAB — COMPREHENSIVE METABOLIC PANEL
Albumin: 3.7 g/dL (ref 3.4–5.0)
Alkaline Phosphatase: 74 U/L (ref 50–136)
Anion Gap: 5 — ABNORMAL LOW (ref 7–16)
BUN: 10 mg/dL (ref 7–18)
Bilirubin,Total: 0.4 mg/dL (ref 0.2–1.0)
Calcium, Total: 8.7 mg/dL (ref 8.5–10.1)
Chloride: 105 mmol/L (ref 98–107)
Co2: 27 mmol/L (ref 21–32)
Creatinine: 1.14 mg/dL (ref 0.60–1.30)
EGFR (African American): >60
EGFR (Non-African Amer.): 58 — ABNORMAL LOW
Glucose: 108 mg/dL — ABNORMAL HIGH (ref 65–99)
Osmolality: 273 (ref 275–301)
Potassium: 3.1 mmol/L — ABNORMAL LOW (ref 3.5–5.1)
SGOT(AST): 30 U/L (ref 15–37)
SGPT (ALT): 24 U/L
Sodium: 137 mmol/L (ref 136–145)
Total Protein: 7.6 g/dL (ref 6.4–8.2)

## 2011-10-07 LAB — CBC
MCHC: 32.3 g/dL (ref 32.0–36.0)
Platelet: 240 10*3/uL (ref 150–440)
RBC: 5.03 10*6/uL (ref 3.80–5.20)
RDW: 13.6 % (ref 11.5–14.5)
WBC: 7 10*3/uL (ref 3.6–11.0)

## 2011-10-07 LAB — TROPONIN I: Troponin-I: 0.02 ng/mL

## 2011-10-10 ENCOUNTER — Encounter (HOSPITAL_COMMUNITY): Payer: Self-pay | Admitting: *Deleted

## 2011-10-10 ENCOUNTER — Emergency Department (HOSPITAL_COMMUNITY)
Admission: EM | Admit: 2011-10-10 | Discharge: 2011-10-10 | Disposition: A | Discharge status: Home / Self Care | Payer: BC Managed Care – PPO | Attending: Emergency Medicine | Admitting: Emergency Medicine

## 2011-10-10 DIAGNOSIS — S41109A Unspecified open wound of unspecified upper arm, initial encounter: Secondary | ICD-10-CM | POA: Insufficient documentation

## 2011-10-10 DIAGNOSIS — S41112A Laceration without foreign body of left upper arm, initial encounter: Secondary | ICD-10-CM

## 2011-10-10 DIAGNOSIS — Z7982 Long term (current) use of aspirin: Secondary | ICD-10-CM | POA: Insufficient documentation

## 2011-10-10 DIAGNOSIS — Z8249 Family history of ischemic heart disease and other diseases of the circulatory system: Secondary | ICD-10-CM | POA: Insufficient documentation

## 2011-10-10 DIAGNOSIS — G4733 Obstructive sleep apnea (adult) (pediatric): Secondary | ICD-10-CM | POA: Insufficient documentation

## 2011-10-10 DIAGNOSIS — I10 Essential (primary) hypertension: Secondary | ICD-10-CM | POA: Insufficient documentation

## 2011-10-10 DIAGNOSIS — Z823 Family history of stroke: Secondary | ICD-10-CM | POA: Insufficient documentation

## 2011-10-10 DIAGNOSIS — R002 Palpitations: Secondary | ICD-10-CM | POA: Insufficient documentation

## 2011-10-10 DIAGNOSIS — Z8042 Family history of malignant neoplasm of prostate: Secondary | ICD-10-CM | POA: Insufficient documentation

## 2011-10-10 DIAGNOSIS — S51809A Unspecified open wound of unspecified forearm, initial encounter: Secondary | ICD-10-CM | POA: Insufficient documentation

## 2011-10-10 DIAGNOSIS — Z90710 Acquired absence of both cervix and uterus: Secondary | ICD-10-CM | POA: Insufficient documentation

## 2011-10-10 DIAGNOSIS — S51009A Unspecified open wound of unspecified elbow, initial encounter: Secondary | ICD-10-CM | POA: Insufficient documentation

## 2011-10-10 DIAGNOSIS — W01119A Fall on same level from slipping, tripping and stumbling with subsequent striking against unspecified sharp object, initial encounter: Secondary | ICD-10-CM | POA: Insufficient documentation

## 2011-10-10 DIAGNOSIS — W268XXA Contact with other sharp object(s), not elsewhere classified, initial encounter: Secondary | ICD-10-CM | POA: Insufficient documentation

## 2011-10-10 DIAGNOSIS — Z836 Family history of other diseases of the respiratory system: Secondary | ICD-10-CM | POA: Insufficient documentation

## 2011-10-10 DIAGNOSIS — E669 Obesity, unspecified: Secondary | ICD-10-CM | POA: Insufficient documentation

## 2011-10-10 MED ORDER — HYDROCODONE-ACETAMINOPHEN 5-325 MG PO TABS: 2.0000 | ORAL_TABLET | Freq: Four times a day (QID) | ORAL | Status: DC | PRN

## 2011-10-10 MED ORDER — TETANUS-DIPHTH-ACELL PERTUSSIS 5-2.5-18.5 LF-MCG/0.5 IM SUSP
0.5000 mL | Freq: Once | INTRAMUSCULAR | Status: AC
  Administered 2011-10-10: 0.5 mL via INTRAMUSCULAR
  Filled 2011-10-10: qty 0.5

## 2011-10-10 MED ORDER — CEPHALEXIN 500 MG PO CAPS: 500.0000 mg | ORAL_CAPSULE | Freq: Four times a day (QID) | ORAL | Status: DC

## 2011-10-10 NOTE — ED Provider Notes (Signed)
History     CSN: [id number]  Arrival date & time 10/10/11  0249   First MD Initiated Contact with Patient 10/10/11 0308      Chief Complaint  Patient presents with  . Laceration    (Consider location/radiation/quality/duration/timing/severity/associated sxs/prior treatment) HPI  46 year old female presents for management of lacerations. Patient reports she was running to her car when she accidentally slipped on the wet cemented and fell on a window pane. She denies hitting her head or loss of consciousness. She reports laceration to her left arm. She denies any numbness. Sts glass broken. Denies joint pain.  Not UTD with tetanus.    Past Medical History  Diagnosis Date  . Palpitation     event monito 2/10  . Chest pain     negative Myoview march 2009  . HTN (hypertension)     controlled  . OSA (obstructive sleep apnea)     mild  . Obesity   . Lipoma     Hx-left leg. s/p excision  . Rectal bleeding 5/10    normal colonoscopy. Dr. Arlyce Dice    Past Surgical History  Procedure Date  . Hysterectomy unspecified area   . Abdominal hysterectomy     Family History  Problem Relation Age of Onset  . Coronary artery disease      female 1st degree relative <60/ female 1st degree <50  . Stroke      female 1st degree relatvie <50  . Allergies Sister     and daughter  . Allergy (severe) Sister   . Heart attack Sister   . Asthma Daughter   . Allergy (severe) Daughter   . Heart disease Mother   . Heart attack Mother   . Other Mother     rheumatism  . Heart disease Father   . Heart disease Paternal Grandfather   . Heart attack Paternal Grandfather   . Stroke Paternal Grandfather   . Cancer Paternal Grandfather     prostate  . Prostate cancer Maternal Grandfather   . Other Maternal Grandmother     rheumatism    History  Substance Use Topics  . Smoking status: Never Smoker   . Smokeless tobacco: Not on file  . Alcohol Use: Yes     occasional    OB History    Grav  Para Term Preterm Abortions TAB SAB Ect Mult Living                  Review of Systems  All other systems reviewed and are negative.    Allergies  Sulfonamide derivatives  Home Medications   Current Outpatient Rx  Name Route Sig Dispense Refill  . ASPIRIN 325 MG PO TABS Oral Take 325 mg by mouth daily.      . CYCLOBENZAPRINE HCL 5 MG PO TABS Oral Take 5 mg by mouth 2 (two) times daily as needed.      Marland Kitchen EPINEPHRINE 0.3 MG/0.3ML IJ DEVI Intramuscular Inject 0.3 mLs (0.3 mg total) into the muscle once. For severe allergic reaction 1 Device 0  . FLUTICASONE PROPIONATE 50 MCG/ACT NA SUSP Nasal Place 2 sprays into the nose daily. 16 g 2  . FUROSEMIDE 40 MG PO TABS Oral Take 1 tablet (40 mg total) by mouth daily. MUST MAKE APPT FOR ADDITIONAL REFILLS 30 tablet 2  . GLUCOSE BLOOD VI STRP Other 1 each by Other route daily. ACCU-Chek Aiva strip and multiclix lancets misc. Use daily-UAD     . METFORMIN HCL ER (OSM) 500  MG PO TB24 Oral Take 1 tablet (500 mg total) by mouth 2 (two) times daily before a meal. Or with meals 60 tablet 2  . NEBIVOLOL HCL 5 MG PO TABS Oral Take 1 tablet (5 mg total) by mouth daily. 30 tablet 2  . OMEPRAZOLE 20 MG PO CPDR Oral Take 20 mg by mouth daily. 30 mins before meals     . POTASSIUM CHLORIDE CRYS ER 10 MEQ PO TBCR Oral Take 1 tablet (10 mEq total) by mouth daily. 30 tablet 2  . PRAVASTATIN SODIUM 40 MG PO TABS Oral Take 1 tablet (40 mg total) by mouth daily. 30 tablet 2    BP 134/88  Pulse 104  Temp 98.9 F (37.2 C)  Resp 20  SpO2 95%  Physical Exam  Nursing note and vitals reviewed. Constitutional: She is oriented to person, place, and time. She appears well-developed and well-nourished. No distress.       obese  HENT:  Head: Normocephalic and atraumatic.  Eyes: Conjunctivae and EOM are normal. Pupils are equal, round, and reactive to light.  Neck: Normal range of motion. Neck supple.  Pulmonary/Chest: She exhibits no tenderness.  Abdominal: There  is no tenderness.  Musculoskeletal: Normal range of motion.  Neurological: She is alert and oriented to person, place, and time.  Skin: Skin is warm. No rash noted.       Multiple lacerations to L arm:  3cm superficial vertical laceration to mid forearm posteriorly  3cm horizontal laceration to posterior aspect of L elbow  2cm superficial laceration proximal to L elbow  7cm deep laceration to posterior aspect of L upper arm  3cm superfical oblique laceration to posterior L upper arm  No evidence of fb, or glasses noted on exam.  Sensation intact throughout.  No tendon laceration noted.  No vascular compromise.       ED Course  Procedures (including critical care time)  Labs Reviewed - No data to display No results found.   No diagnosis found.  LACERATION REPAIR Performed by: Fayrene Helper Authorized byFayrene Helper Consent: Verbal consent obtained. Risks and benefits: risks, benefits and alternatives were discussed Consent given by: patient Patient identity confirmed: provided demographic data Prepped and Draped in normal sterile fashion Wound explored  Laceration Location: posterior L forearm   Laceration Length: 3cm  No Foreign Bodies seen or palpated  Anesthesia: local infiltration  Local anesthetic: lidocaine 2% w epinephrine  Anesthetic total: 2 ml  Irrigation method: syringe Amount of cleaning: standard  Skin closure: sterile strip  Patient tolerance: Patient tolerated the procedure well with no immediate complications.  LACERATION REPAIR Performed by: Fayrene Helper Authorized byFayrene Helper Consent: Verbal consent obtained. Risks and benefits: risks, benefits and alternatives were discussed Consent given by: patient Patient identity confirmed: provided demographic data Prepped and Draped in normal sterile fashion Wound explored  Laceration Location: L elbow  Laceration Length: 3cm  No Foreign Bodies seen or palpated  Anesthesia: local  infiltration  Local anesthetic: lidocaine 2% w epinephrine  Anesthetic total: 3 ml  Irrigation method: syringe Amount of cleaning: standard  Skin closure: 4.0 prolene  Number of sutures: 4  Technique: simple interrupted  Patient tolerance: Patient tolerated the procedure well with no immediate complications.  LACERATION REPAIR Performed by: Fayrene Helper Authorized byFayrene Helper Consent: Verbal consent obtained. Risks and benefits: risks, benefits and alternatives were discussed Consent given by: patient Patient identity confirmed: provided demographic data Prepped and Draped in normal sterile fashion Wound explored  Laceration Location: posterior L upper arm  Laceration Length: 7cm  No Foreign Bodies seen or palpated  Anesthesia: local infiltration  Local anesthetic: lidocaine 2% w epinephrine  Anesthetic total: 10 ml  Irrigation method: syringe Amount of cleaning: standard  Skin closure: prolene 4.0  Number of sutures: 9  Technique: simple interrupted  Patient tolerance: Patient tolerated the procedure well with no immediate complications.  LACERATION REPAIR Performed by: Fayrene Helper Authorized byFayrene Helper Consent: Verbal consent obtained. Risks and benefits: risks, benefits and alternatives were discussed Consent given by: patient Patient identity confirmed: provided demographic data Prepped and Draped in normal sterile fashion Wound explored  Laceration Location: posterior L upper arm  Laceration Length: 3cm  No Foreign Bodies seen or palpated  Anesthesia: local infiltration  Local anesthetic: lidocaine 2% w epinephrine  Anesthetic total: 3 ml  Irrigation method: syringe Amount of cleaning: standard  Skin closure: sterile strip  Patient tolerance: Patient tolerated the procedure well with no immediate complications.  MDM  Multiple laceration from superficial to deep to L arm.  Wound thoroughly irrigated.  No fb seen or palpated.   Sharp debridement performed to 2 lacerations.  Sutures and sterile strips were used.  Pt tolerates well.  tdap updated.    Plan to d/c with pain medication, abx, and return precaution.  F/u for suture removal.  Pt voice understanding and agrees with plan.          Fayrene Helper, PA-C 10/10/11 [phone]

## 2011-10-10 NOTE — ED Notes (Signed)
The pt has multiple lacerations to the lt arm  3" laceration to the posterior forearm and smaller scattered cuts also to the lt forearm

## 2011-10-10 NOTE — ED Notes (Signed)
PT given drink; PA at bedside suturing arm laceration.

## 2011-10-10 NOTE — ED Notes (Signed)
Pt stated that she was running to the car. She slipped on wet cement and fell on a window pane. She has 3 lacerations on her left arm. 2 small lacerations and 1 laceration about 3 inches. Bleeding is controlled. CNS intact. Arm warm to touch. Radial pulse palpable. Capillary refill WNL. Pt able to move arms and hands. Lacerations dressed. Suture cart at bedside. Will continue to monitor.

## 2011-10-10 NOTE — ED Notes (Signed)
The pt slipped on the wet porch and fell into the glass in her window.  Bleeding controlled

## 2011-10-10 NOTE — ED Provider Notes (Signed)
Medical screening examination/treatment/procedure(s) were performed by non-physician practitioner and as supervising physician I was immediately available for consultation/collaboration.  Doug Sou, MD 10/10/11 [phone]

## 2011-10-12 ENCOUNTER — Emergency Department (HOSPITAL_COMMUNITY)
Admission: EM | Admit: 2011-10-12 | Discharge: 2011-10-12 | Disposition: A | Discharge status: Home / Self Care | Payer: BC Managed Care – PPO | Attending: Emergency Medicine | Admitting: Emergency Medicine

## 2011-10-12 ENCOUNTER — Encounter (HOSPITAL_COMMUNITY): Payer: Self-pay | Admitting: *Deleted

## 2011-10-12 DIAGNOSIS — I10 Essential (primary) hypertension: Secondary | ICD-10-CM | POA: Insufficient documentation

## 2011-10-12 DIAGNOSIS — G4733 Obstructive sleep apnea (adult) (pediatric): Secondary | ICD-10-CM | POA: Insufficient documentation

## 2011-10-12 DIAGNOSIS — Z823 Family history of stroke: Secondary | ICD-10-CM | POA: Insufficient documentation

## 2011-10-12 DIAGNOSIS — Z825 Family history of asthma and other chronic lower respiratory diseases: Secondary | ICD-10-CM | POA: Insufficient documentation

## 2011-10-12 DIAGNOSIS — T148XXA Other injury of unspecified body region, initial encounter: Secondary | ICD-10-CM

## 2011-10-12 DIAGNOSIS — Z8249 Family history of ischemic heart disease and other diseases of the circulatory system: Secondary | ICD-10-CM | POA: Insufficient documentation

## 2011-10-12 DIAGNOSIS — E669 Obesity, unspecified: Secondary | ICD-10-CM | POA: Insufficient documentation

## 2011-10-12 DIAGNOSIS — Z09 Encounter for follow-up examination after completed treatment for conditions other than malignant neoplasm: Secondary | ICD-10-CM | POA: Insufficient documentation

## 2011-10-12 MED ORDER — OXYCODONE-ACETAMINOPHEN 5-325 MG PO TABS: 1.0000 | ORAL_TABLET | Freq: Four times a day (QID) | ORAL | Status: AC | PRN

## 2011-10-12 MED ORDER — CEPHALEXIN 250 MG PO CAPS
500.0000 mg | ORAL_CAPSULE | Freq: Once | ORAL | Status: AC
  Administered 2011-10-12: 500 mg via ORAL
  Filled 2011-10-12: qty 2

## 2011-10-12 NOTE — ED Provider Notes (Signed)
History   This chart was scribed for Charles B. Bernette Mayers, MD by Toya Smothers. The patient was seen in room TR07C/TR07C. Patient's care was started at 1123.  CSN: [id number]  Arrival date & time 10/12/11  1123   First MD Initiated Contact with Patient 10/12/11 1236      Chief Complaint  Patient presents with  . Wound Check   The history is provided by the patient. No language interpreter was used.    GRACIELLA ARMENT is a 46 y.o. female who presents to the Emergency Department complaining of gradual onset moderate swelling and yellowish drainage from a sutured laceration. Pt reports that she was seen 2 days ago for sutures. Has not filled prescriptions yet. Treated pain prior to arrival with mother's percocet, which provided moderate relief. Pt lists ah/o HTN, OSA, obesity, palpations, and lipoma.  Past Medical History  Diagnosis Date  . Palpitation     event monito 2/10  . Chest pain     negative Myoview march 2009  . HTN (hypertension)     controlled  . OSA (obstructive sleep apnea)     mild  . Obesity   . Lipoma     Hx-left leg. s/p excision  . Rectal bleeding 5/10    normal colonoscopy. Dr. Arlyce Dice    Past Surgical History  Procedure Date  . Hysterectomy unspecified area   . Abdominal hysterectomy     Family History  Problem Relation Age of Onset  . Coronary artery disease      female 1st degree relative <60/ female 1st degree <50  . Stroke      female 1st degree relatvie <50  . Allergies Sister     and daughter  . Allergy (severe) Sister   . Heart attack Sister   . Asthma Daughter   . Allergy (severe) Daughter   . Heart disease Mother   . Heart attack Mother   . Other Mother     rheumatism  . Heart disease Father   . Heart disease Paternal Grandfather   . Heart attack Paternal Grandfather   . Stroke Paternal Grandfather   . Cancer Paternal Grandfather     prostate  . Prostate cancer Maternal Grandfather   . Other Maternal Grandmother     rheumatism     History  Substance Use Topics  . Smoking status: Never Smoker   . Smokeless tobacco: Not on file  . Alcohol Use: Yes     occasional   Review of Systems  Constitutional: Negative for fever.  HENT: Negative for rhinorrhea.   Eyes: Negative for pain.  Respiratory: Negative for cough and shortness of breath.   Cardiovascular: Negative for chest pain.  Gastrointestinal: Negative for nausea, vomiting, abdominal pain and diarrhea.  Genitourinary: Negative for dysuria.  Musculoskeletal: Negative for back pain.  Skin: Positive for color change (redness) and wound. Negative for rash.  Neurological: Negative for weakness and headaches.  All other systems reviewed and are negative.    Allergies  Sulfonamide derivatives  Home Medications   Current Outpatient Rx  Name Route Sig Dispense Refill  . CEPHALEXIN 500 MG PO CAPS Oral Take 500 mg by mouth 4 (four) times daily. First dose 10/10/2011.  Take for 10 days.    . FUROSEMIDE 40 MG PO TABS Oral Take 40 mg by mouth daily.    Marland Kitchen HYDROCODONE-ACETAMINOPHEN 5-325 MG PO TABS Oral Take 2 tablets by mouth every 6 (six) hours as needed for pain. 30 tablet 0  . NEBIVOLOL  HCL 5 MG PO TABS Oral Take 1 tablet (5 mg total) by mouth daily. 30 tablet 2    BP 122/107  Pulse 80  Temp 98.4 F (36.9 C) (Oral)  Resp 20  Ht 5\' 5"  (1.651 m)  Wt 276 lb (125.193 kg)  BMI 45.93 kg/m2  SpO2 98%  Physical Exam  Nursing note and vitals reviewed. Constitutional: She is oriented to person, place, and time. She appears well-developed and well-nourished. No distress.  HENT:  Head: Normocephalic and atraumatic.  Eyes: EOM are normal. Pupils are equal, round, and reactive to light.  Neck: Neck supple. No tracheal deviation present.  Cardiovascular: Normal rate.   Pulmonary/Chest: Effort normal. No respiratory distress.  Abdominal: Soft. She exhibits no distension.  Musculoskeletal: Normal range of motion. She exhibits no edema.  Neurological: She is alert  and oriented to person, place, and time. No sensory deficit.  Skin: Skin is warm and dry.       Multiple laceration on L arm. Drainage from smaller lacerations. Erythematous area near larger laceration.  Psychiatric: She has a normal mood and affect. Her behavior is normal.    ED Course  Procedures (including critical care time) DIAGNOSTIC STUDIES: Oxygen Saturation is 98% on room air, normal by my interpretation.    COORDINATION OF CARE: 1248- Will give dose of keflex.   Labs Reviewed - No data to display No results found.   No diagnosis found.    MDM  Pt with multiple recent arm lacerations. Had been prescribed Keflex but did not get it filled. Advised to get meds filled. Given a dose in the ED.    I personally performed the services described in the documentation, which were scribed in my presence. The recorded information has been reviewed and considered.        Charles B. Bernette Mayers, MD 10/12/11 [phone]

## 2011-10-12 NOTE — ED Notes (Signed)
Patient to Ed for a wound recheck.  States that one of her wounds began oozing yellow drainage. No other wounds are noted to be draining.

## 2011-10-12 NOTE — ED Notes (Signed)
Patient was seen here on 07/03 for laceration to the left upper arm.  She had sutures placed.  She has noted more swelling and yellow drainage.  She has returned for follow up due to concerns for infection.  She has not started her abt at this time due to cost

## 2011-10-19 ENCOUNTER — Emergency Department (HOSPITAL_COMMUNITY)
Admission: EM | Admit: 2011-10-19 | Discharge: 2011-10-19 | Disposition: A | Discharge status: Home / Self Care | Payer: BC Managed Care – PPO | Attending: Emergency Medicine | Admitting: Emergency Medicine

## 2011-10-19 ENCOUNTER — Encounter (HOSPITAL_COMMUNITY): Payer: Self-pay | Admitting: Emergency Medicine

## 2011-10-19 DIAGNOSIS — G4733 Obstructive sleep apnea (adult) (pediatric): Secondary | ICD-10-CM | POA: Insufficient documentation

## 2011-10-19 DIAGNOSIS — Z4801 Encounter for change or removal of surgical wound dressing: Secondary | ICD-10-CM | POA: Insufficient documentation

## 2011-10-19 DIAGNOSIS — Z5189 Encounter for other specified aftercare: Secondary | ICD-10-CM

## 2011-10-19 DIAGNOSIS — I10 Essential (primary) hypertension: Secondary | ICD-10-CM | POA: Insufficient documentation

## 2011-10-19 DIAGNOSIS — E669 Obesity, unspecified: Secondary | ICD-10-CM | POA: Insufficient documentation

## 2011-10-19 DIAGNOSIS — B379 Candidiasis, unspecified: Secondary | ICD-10-CM | POA: Insufficient documentation

## 2011-10-19 MED ORDER — FLUCONAZOLE 150 MG PO TABS: 150.0000 mg | ORAL_TABLET | Freq: Once | ORAL | Status: AC

## 2011-10-19 NOTE — ED Notes (Signed)
Pt states she is here for suture removal to L arm.  Also request medication for yeast infection.  States she is still taking antibiotic.

## 2011-10-19 NOTE — ED Provider Notes (Signed)
History   This chart was scribed for Victoria Kras, MD by Shari Heritage. The patient was seen in room TR02C/TR02C. Patient's care was started at 1744.     CSN: [id number]  Arrival date & time 10/19/11  1744   First MD Initiated Contact with Patient 10/19/11 1804      Chief Complaint  Patient presents with  . Suture / Staple Removal    (Consider location/radiation/quality/duration/timing/severity/associated sxs/prior treatment) Patient is a 46 y.o. female presenting with wound check. The history is provided by the patient. No language interpreter was used.  Wound Check  She was treated in the ED 5 to 10 days ago. Previous treatment in the ED includes laceration repair. Treatments since wound repair include oral antibiotics and a wound recheck. There has been no drainage from the wound. The redness has not changed. The swelling has improved. The pain has no pain. She has no difficulty moving the affected extremity or digit.   JENEVIEVE KIRSCHBAUM is a 46 y.o. female who presents to the Emergency Department needing a wound check after having sutures placed 1 week ago on 10/10/11. Patient had sutures placed on the posterior left forearm for a 3cm laceration and on the elbow for another 3 cm laceration. Patient is still taking antibiotics. Patient is also complaining of a yeast infection. She requests treatment. Patient with h/o palpitation, chest pain, HTN, lipoma and abdominal hysterectomy. Patient has never smoked.  When the yeast infection started she stopped the abx.  Past Medical History  Diagnosis Date  . Palpitation     event monito 2/10  . Chest pain     negative Myoview march 2009  . HTN (hypertension)     controlled  . OSA (obstructive sleep apnea)     mild  . Obesity   . Lipoma     Hx-left leg. s/p excision  . Rectal bleeding 5/10    normal colonoscopy. Dr. Arlyce Dice    Past Surgical History  Procedure Date  . Hysterectomy unspecified area   . Abdominal hysterectomy     Family  History  Problem Relation Age of Onset  . Coronary artery disease      female 1st degree relative <60/ female 1st degree <50  . Stroke      female 1st degree relatvie <50  . Allergies Sister     and daughter  . Allergy (severe) Sister   . Heart attack Sister   . Asthma Daughter   . Allergy (severe) Daughter   . Heart disease Mother   . Heart attack Mother   . Other Mother     rheumatism  . Heart disease Father   . Heart disease Paternal Grandfather   . Heart attack Paternal Grandfather   . Stroke Paternal Grandfather   . Cancer Paternal Grandfather     prostate  . Prostate cancer Maternal Grandfather   . Other Maternal Grandmother     rheumatism    History  Substance Use Topics  . Smoking status: Never Smoker   . Smokeless tobacco: Not on file  . Alcohol Use: Yes     occasional    OB History    Grav Para Term Preterm Abortions TAB SAB Ect Mult Living                  Review of Systems  Constitutional: Negative for fever.  HENT: Negative for neck pain.   Eyes: Negative for visual disturbance.  Respiratory: Negative for cough.   Cardiovascular: Negative  for chest pain.  Gastrointestinal: Negative for nausea and abdominal pain.  Genitourinary: Negative for frequency.  Musculoskeletal: Negative for back pain.  Skin: Positive for wound (Sutures in left elbow and posterior left forearm.). Negative for rash.  Neurological: Negative for numbness.  Psychiatric/Behavioral: Negative for confusion.    Allergies  Sulfonamide derivatives  Home Medications   Current Outpatient Rx  Name Route Sig Dispense Refill  . CEPHALEXIN 500 MG PO CAPS Oral Take 500 mg by mouth 4 (four) times daily. First dose 10/10/2011.  Take for 10 days. Patient states she started medication week ago    . FUROSEMIDE 40 MG PO TABS Oral Take 40 mg by mouth daily.    . NEBIVOLOL HCL 5 MG PO TABS Oral Take 1 tablet (5 mg total) by mouth daily. 30 tablet 2  . OXYCODONE-ACETAMINOPHEN 5-325 MG PO TABS Oral  Take 1-2 tablets by mouth every 6 (six) hours as needed for pain. 20 tablet 0    BP 131/82  Pulse 70  Temp 98.5 F (36.9 C) (Oral)  Resp 16  SpO2 96%  Physical Exam  Nursing note and vitals reviewed. Constitutional: She appears well-developed and well-nourished. No distress.  HENT:  Head: Normocephalic and atraumatic.  Right Ear: External ear normal.  Left Ear: External ear normal.  Eyes: Conjunctivae are normal. Right eye exhibits no discharge. Left eye exhibits no discharge. No scleral icterus.  Neck: Neck supple. No tracheal deviation present.  Cardiovascular: Normal rate.   Pulmonary/Chest: Effort normal. No stridor. No respiratory distress.  Musculoskeletal: She exhibits no edema.  Neurological: She is alert. Cranial nerve deficit: no gross deficits.  Skin: Skin is warm and dry. No rash noted.       Sutured wounds of left forearm and left elbow, well approximated, mild erythema around forearm sutured wound. No purulence. No increased warmth or tenderness.   Psychiatric: She has a normal mood and affect.    ED Course  Procedures (including critical care time) DIAGNOSTIC STUDIES: Oxygen Saturation is 96% on room air, adequate by my interpretation.    COORDINATION OF CARE: 6:05pm- Patient informed of current plan for treatment and evaluation and agrees with plan at this time. Will prescribe Diflucan to treat yeast infection.    Labs Reviewed - No data to display No results found.   1. Visit for wound check   2. Yeast infection       MDM  Sutured wound over the joint is not completely healed. Currently it has been 7 days. Patient will come back within the next week to have wound rechecked, but at this time not ready to have sutures removed. Will prescribe Diflucan for yeast infection. Pelvic exam is deferred.   I personally performed the services described in this documentation, which was scribed in my presence.  The recorded information has been reviewed and  considered.    Victoria Kras, MD 10/19/11 [phone]

## 2011-10-30 ENCOUNTER — Emergency Department (HOSPITAL_COMMUNITY)
Admission: EM | Admit: 2011-10-30 | Discharge: 2011-10-30 | Disposition: A | Discharge status: Home / Self Care | Payer: BC Managed Care – PPO | Attending: Emergency Medicine | Admitting: Emergency Medicine

## 2011-10-30 ENCOUNTER — Encounter (HOSPITAL_COMMUNITY): Payer: Self-pay | Admitting: Emergency Medicine

## 2011-10-30 DIAGNOSIS — Z882 Allergy status to sulfonamides status: Secondary | ICD-10-CM | POA: Insufficient documentation

## 2011-10-30 DIAGNOSIS — B379 Candidiasis, unspecified: Secondary | ICD-10-CM

## 2011-10-30 DIAGNOSIS — IMO0002 Reserved for concepts with insufficient information to code with codable children: Secondary | ICD-10-CM

## 2011-10-30 DIAGNOSIS — I10 Essential (primary) hypertension: Secondary | ICD-10-CM | POA: Insufficient documentation

## 2011-10-30 DIAGNOSIS — Z4802 Encounter for removal of sutures: Secondary | ICD-10-CM | POA: Insufficient documentation

## 2011-10-30 HISTORY — DX: Candidiasis, unspecified: B37.9

## 2011-10-30 MED ORDER — MICONAZOLE NITRATE 1200 & 2 MG & % VA KIT: 1.0000 | PACK | Freq: Once | VAGINAL | Status: AC

## 2011-10-30 MED ORDER — MICONAZOLE NITRATE 2 % VA CREA: 1.0000 | TOPICAL_CREAM | Freq: Every day | VAGINAL | Status: AC

## 2011-10-30 NOTE — ED Notes (Signed)
Patient here for removal of stitches on her left forearm; patient reports itching.  Patient states that she has not been taking her antibiotics because it has caused a yeast infection.  Slight redness noted around suture site.

## 2011-10-30 NOTE — ED Provider Notes (Signed)
History     CSN: [id number]  Arrival date & time 10/30/11  2000   First MD Initiated Contact with Patient 10/30/11 2118      Chief Complaint  Patient presents with  . Suture / Staple Removal    (Consider location/radiation/quality/duration/timing/severity/associated sxs/prior treatment) HPI Comments: Patient to the emergency department for suture removal.  Sutures placed 7/3.  Patient came for wound check on 7/5 without full healing.  Patient presents today after taking prescribed antibiotics stating that she denies fevers, night sweats, chills, weeping wound, wound separation, arm streaking, large surrounding erythema or warmth skin.  Patient reports mild pruritus over scars  Patient is a 46 y.o. female presenting with suture removal. The history is provided by the patient.  Suture / Staple Removal     Past Medical History  Diagnosis Date  . Palpitation     event monito 2/10  . Chest pain     negative Myoview march 2009  . HTN (hypertension)     controlled  . OSA (obstructive sleep apnea)     mild  . Obesity   . Lipoma     Hx-left leg. s/p excision  . Rectal bleeding 5/10    normal colonoscopy. Dr. Arlyce Dice  . Yeast infection     Past Surgical History  Procedure Date  . Hysterectomy unspecified area   . Abdominal hysterectomy     Family History  Problem Relation Age of Onset  . Coronary artery disease      female 1st degree relative <60/ female 1st degree <50  . Stroke      female 1st degree relatvie <50  . Allergies Sister     and daughter  . Allergy (severe) Sister   . Heart attack Sister   . Asthma Daughter   . Allergy (severe) Daughter   . Heart disease Mother   . Heart attack Mother   . Other Mother     rheumatism  . Heart disease Father   . Heart disease Paternal Grandfather   . Heart attack Paternal Grandfather   . Stroke Paternal Grandfather   . Cancer Paternal Grandfather     prostate  . Prostate cancer Maternal Grandfather   . Other Maternal  Grandmother     rheumatism    History  Substance Use Topics  . Smoking status: Never Smoker   . Smokeless tobacco: Not on file  . Alcohol Use: Yes     occasional    OB History    Grav Para Term Preterm Abortions TAB SAB Ect Mult Living                  Review of Systems  All other systems reviewed and are negative.    Allergies  Sulfonamide derivatives  Home Medications   Current Outpatient Rx  Name Route Sig Dispense Refill  . FUROSEMIDE 40 MG PO TABS Oral Take 40 mg by mouth daily.    . NEBIVOLOL HCL 5 MG PO TABS Oral Take 1 tablet (5 mg total) by mouth daily. 30 tablet 2    BP 159/98  Pulse 73  Temp 98.6 F (37 C) (Oral)  Resp 16  SpO2 100%  Physical Exam  Nursing note and vitals reviewed. Constitutional: She is oriented to person, place, and time. She appears well-developed and well-nourished. No distress.  HENT:  Head: Normocephalic and atraumatic.  Eyes: Conjunctivae and EOM are normal.  Neck: Normal range of motion.  Pulmonary/Chest: Effort normal.  Musculoskeletal: Normal range of motion.  Neurological: She is alert and oriented to person, place, and time.  Skin: Skin is warm and dry. No rash noted. She is not diaphoretic.       Left posterior forearm and left elbow with suture scars.  Wounds appear healed and ready for suture removal.  No dehiscing or drainage.  No palpable warmth or erythema surrounding.  Psychiatric: She has a normal mood and affect. Her behavior is normal.    ED Course  SUTURE REMOVAL Date/Time: 10/30/2011 10:41 PM Performed by: Jaci Carrel Authorized by: Jaci Carrel Consent: Verbal consent obtained. Risks and benefits: risks, benefits and alternatives were discussed Consent given by: patient Patient understanding: patient states understanding of the procedure being performed Patient consent: the patient's understanding of the procedure matches consent given Patient identity confirmed: verbally with patient Time out:  Immediately prior to procedure a "time out" was called to verify the correct patient, procedure, equipment, support staff and site/side marked as required. Location: Left upper extremity, posterior forearm and elbow. Wound Appearance: clean (Wound nontender, erythematous, indurated, draining or purulent) Sutures Removed: 12 Facility: sutures placed in this facility Patient tolerance: Patient tolerated the procedure well with no immediate complications.   (including critical care time)  Labs Reviewed - No data to display No results found.   No diagnosis found.    MDM  Suture removal  Wound appears clean without any signs of infection.  Patient reports that she is taking an antibiotic prescribed by previous doctor and that she believes she has a yeast infection.  Discussed need for pelvic exam which was deferred.  Patient will be given Monistat.  Scar minimizing treatments discussed.  At this time there does not appear to be any evidence of an acute emergency medical condition and the patient appears stable for discharge with appropriate outpatient follow up.Diagnosis was discussed with patient who verbalizes understanding and is agreeable to discharge.        Jaci Carrel, [place] 10/30/11 2244

## 2011-10-30 NOTE — ED Notes (Signed)
Sutures intact to wound to left inner upper arm; wound edges partially approximated - slightly reddened with pale white colored flesh noted to isolated areas of wound; pt reports was prescribed unk abx; however, feels as if she needs a different abx as the one she is currently taking is giving her a yeast infection

## 2011-10-30 NOTE — ED Provider Notes (Signed)
Medical screening examination/treatment/procedure(s) were performed by non-physician practitioner and as supervising physician I was immediately available for consultation/collaboration.  Gerhard Munch, MD 10/30/11 2351

## 2012-05-13 ENCOUNTER — Telehealth: Payer: Self-pay | Admitting: *Deleted

## 2012-05-13 NOTE — Telephone Encounter (Signed)
Received fax from [facility] requesting signed order for pt's diabetic testing supplies. Pt has not been seen since 10/2010 and will need to arrange an appt if she would like [place] to complete this form. Please call pt and verify if she is seeing someone else for primary care. If not, please arrange appt.

## 2012-05-14 NOTE — Telephone Encounter (Signed)
Patient states she had been paying out of pocket for diabetic testing supplies. She scheduled an appointment for 05/27/12

## 2012-05-27 ENCOUNTER — Ambulatory Visit: Payer: BC Managed Care – PPO | Admitting: Family

## 2012-06-04 ENCOUNTER — Encounter: Payer: Self-pay | Admitting: Family

## 2012-06-04 ENCOUNTER — Ambulatory Visit (INDEPENDENT_AMBULATORY_CARE_PROVIDER_SITE_OTHER): Payer: BC Managed Care – PPO | Admitting: Family

## 2012-06-04 VITALS — BP 146/98 | HR 80 | Temp 98.0°F | Resp 16 | Ht 66.0 in | Wt 292.1 lb

## 2012-06-04 DIAGNOSIS — E785 Hyperlipidemia, unspecified: Secondary | ICD-10-CM

## 2012-06-04 DIAGNOSIS — E119 Type 2 diabetes mellitus without complications: Secondary | ICD-10-CM

## 2012-06-04 DIAGNOSIS — I10 Essential (primary) hypertension: Secondary | ICD-10-CM

## 2012-06-04 DIAGNOSIS — N760 Acute vaginitis: Secondary | ICD-10-CM

## 2012-06-04 LAB — HEPATIC FUNCTION PANEL
ALT: 16 U/L (ref 0–35)
AST: 18 U/L (ref 0–37)
Bilirubin, Direct: 0.1 mg/dL (ref 0.0–0.3)
Indirect Bilirubin: 0.2 mg/dL (ref 0.0–0.9)

## 2012-06-04 LAB — BASIC METABOLIC PANEL WITH GFR
BUN: 13 mg/dL (ref 6–23)
CO2: 27 mEq/L (ref 19–32)
Calcium: 9.5 mg/dL (ref 8.4–10.5)
Glucose, Bld: 89 mg/dL (ref 70–99)
Sodium: 135 mEq/L (ref 135–145)

## 2012-06-04 MED ORDER — FUROSEMIDE 40 MG PO TABS: 40.0000 mg | ORAL_TABLET | Freq: Every day | ORAL | Status: DC

## 2012-06-04 MED ORDER — METFORMIN HCL ER 500 MG PO TB24: ORAL_TABLET | ORAL | Status: DC

## 2012-06-04 MED ORDER — POTASSIUM CHLORIDE ER 10 MEQ PO CPCR: 10.0000 meq | ORAL_CAPSULE | Freq: Every day | ORAL | Status: DC

## 2012-06-04 MED ORDER — PRAVASTATIN SODIUM 40 MG PO TABS: 40.0000 mg | ORAL_TABLET | Freq: Every day | ORAL | Status: DC

## 2012-06-04 MED ORDER — OMEPRAZOLE 20 MG PO CPDR: 20.0000 mg | DELAYED_RELEASE_CAPSULE | Freq: Every day | ORAL | Status: DC

## 2012-06-04 MED ORDER — NEBIVOLOL HCL 5 MG PO TABS: 5.0000 mg | ORAL_TABLET | Freq: Every day | ORAL | Status: DC

## 2012-06-04 NOTE — Assessment & Plan Note (Signed)
GC/Chlamydia, wet prep performed today.  Plan to treat based on results.

## 2012-06-04 NOTE — Assessment & Plan Note (Signed)
Resume metformin, add aspirin for cardiac protection.  Check a1C, refer to opthalmology, check urine microalbumin. Pt requests that we place her on a once daily metfomin regimen.

## 2012-06-04 NOTE — Progress Notes (Signed)
Subjective:    Patient ID: [name], female    DOB: 1966/04/09, 47 y.o.   MRN: [id number]  HPI  [name] is a 47 yr old female who presents today with chief complaint of pelvic pain. She has not been seen in the office since 2012 for her multiple medical problems. She is requesting refills on her medications. She has been following at urgent care.    Reports that she has had several month hx of pelvic pain, discharge, odor.  She is not sexually active.    Review of Systems See HPI  Past Medical History  Diagnosis Date  . Palpitation     event monito 2/10  . Chest pain     negative Myoview march 2009  . HTN (hypertension)     controlled  . OSA (obstructive sleep apnea)     mild  . Obesity   . Lipoma     Hx-left leg. s/p excision  . Rectal bleeding 5/10    normal colonoscopy. Dr. Arlyce Dice  . Yeast infection     History   Social History  . Marital Status: Married    Spouse Name: Iantha Fallen    Number of Children: 3  . Years of Education: N/A   Occupational History  . ADMIN     [employer]   Social History Main Topics  . Smoking status: Never Smoker   . Smokeless tobacco: Not on file  . Alcohol Use: Yes     Comment: occasional  . Drug Use: No  . Sexually Active: Not on file   Other Topics Concern  . Not on file   Social History Narrative   Married   1 daughter went to college     Past Surgical History  Procedure Laterality Date  . Hysterectomy unspecified area    . Abdominal hysterectomy      Family History  Problem Relation Age of Onset  . Coronary artery disease      female 1st degree relative <60/ female 1st degree <50  . Stroke      female 1st degree relatvie <50  . Allergies Sister     and daughter  . Allergy (severe) Sister   . Heart attack Sister   . Asthma Daughter   . Allergy (severe) Daughter   . Heart disease Mother   . Heart attack Mother   . Other Mother     rheumatism  . Heart disease Father   . Heart disease  Paternal Grandfather   . Heart attack Paternal Grandfather   . Stroke Paternal Grandfather   . Cancer Paternal Grandfather     prostate  . Prostate cancer Maternal Grandfather   . Other Maternal Grandmother     rheumatism    Allergies  Allergen Reactions  . Sulfonamide Derivatives Swelling    No current outpatient prescriptions on file prior to visit.   No current facility-administered medications on file prior to visit.    BP 146/98  Pulse 80  Temp(Src) 98 F (36.7 C) (Oral)  Resp 16  Ht 5\' 6"  (1.676 m)  Wt 292 lb 1.9 oz (132.505 kg)  BMI 47.17 kg/m2  SpO2 99%       Objective:   Physical Exam  Constitutional: She is oriented to person, place, and time. She appears well-developed and well-nourished. No distress.  HENT:  Head: Normocephalic and atraumatic.  Cardiovascular: Normal rate and regular rhythm.   No murmur heard. Pulmonary/Chest: Effort normal and breath sounds normal. No  respiratory distress. She has no wheezes. She has no rales. She exhibits no tenderness.  Musculoskeletal: She exhibits no edema.  Neurological: She is alert and oriented to person, place, and time.  Skin: Skin is warm and dry.  Psychiatric: She has a normal mood and affect. Her behavior is normal. Judgment and thought content normal.  GYN: pelvic exam- surgically absent uterus.  Ovaries are not manually palpable.  No pelvic tenderness on exam.         Assessment & Plan:

## 2012-06-04 NOTE — Patient Instructions (Addendum)
Please complete your lab work prior to leaving. Follow up in 1 month for fasting physical.

## 2012-06-04 NOTE — Assessment & Plan Note (Signed)
BP Readings from Last 3 Encounters:  06/04/12 146/98  10/30/11 120/78  10/19/11 131/82   Please follow up in 1 month for bp recheck and fasting physical.

## 2012-06-04 NOTE — Assessment & Plan Note (Signed)
Resume statin, check lft.

## 2012-06-04 NOTE — Addendum Note (Signed)
Addended by: Mervin Kung A on: 06/04/2012 05:09 PM   Modules accepted: Orders

## 2012-06-05 ENCOUNTER — Telehealth: Payer: Self-pay | Admitting: Family

## 2012-06-05 LAB — MICROALBUMIN / CREATININE URINE RATIO: Microalb Creat Ratio: 4.5 mg/g (ref 0.0–30.0)

## 2012-06-05 LAB — GC/CHLAMYDIA PROBE AMP: CT Probe RNA: NEGATIVE

## 2012-06-05 LAB — WET PREP BY MOLECULAR PROBE
Candida species: NEGATIVE
Gardnerella vaginalis: POSITIVE — AB
Trichomonas vaginosis: NEGATIVE

## 2012-06-05 MED ORDER — METRONIDAZOLE 500 MG PO TABS: 500.0000 mg | ORAL_TABLET | Freq: Three times a day (TID) | ORAL | Status: DC

## 2012-06-05 NOTE — Telephone Encounter (Addendum)
Pls call pt and let her know that I reviewed swabs.  Gonorrhea/chlamydia negative.  + Bacterial vaginosis. Will rx with metronidazole, no ETOH while taking. Sugar appears controlled. Continue current dose of metformin.

## 2012-06-05 NOTE — Telephone Encounter (Signed)
Pt notified of results and advise to start meds for bacterial vaginosis. Pt wanted me to ask you when you said continue current dose of metformin do you mean you want her to still take 2 tabs daily because pt said at her last visit you discussed her decreasing dose to once a day if sugar was good, please clarify

## 2012-06-06 NOTE — Telephone Encounter (Signed)
Yes please continue 2 tabs metformin.

## 2012-06-06 NOTE — Telephone Encounter (Signed)
Notified pt and she voices understanding. 

## 2012-06-11 ENCOUNTER — Ambulatory Visit: Payer: BC Managed Care – PPO | Admitting: Family

## 2012-07-02 ENCOUNTER — Encounter: Payer: BC Managed Care – PPO | Admitting: Family

## 2012-07-11 ENCOUNTER — Encounter: Payer: BC Managed Care – PPO | Admitting: Family

## 2012-07-21 ENCOUNTER — Encounter: Payer: BC Managed Care – PPO | Admitting: Family

## 2012-08-08 ENCOUNTER — Encounter (HOSPITAL_BASED_OUTPATIENT_CLINIC_OR_DEPARTMENT_OTHER): Payer: Self-pay

## 2012-08-08 ENCOUNTER — Emergency Department (HOSPITAL_BASED_OUTPATIENT_CLINIC_OR_DEPARTMENT_OTHER)
Admission: EM | Admit: 2012-08-08 | Discharge: 2012-08-08 | Disposition: A | Discharge status: Home / Self Care | Payer: BC Managed Care – PPO | Attending: Emergency Medicine | Admitting: Emergency Medicine

## 2012-08-08 ENCOUNTER — Encounter: Payer: BC Managed Care – PPO | Admitting: Family

## 2012-08-08 ENCOUNTER — Telehealth: Payer: Self-pay | Admitting: Family

## 2012-08-08 DIAGNOSIS — N898 Other specified noninflammatory disorders of vagina: Secondary | ICD-10-CM | POA: Insufficient documentation

## 2012-08-08 DIAGNOSIS — Z8719 Personal history of other diseases of the digestive system: Secondary | ICD-10-CM | POA: Insufficient documentation

## 2012-08-08 DIAGNOSIS — Z79899 Other long term (current) drug therapy: Secondary | ICD-10-CM | POA: Insufficient documentation

## 2012-08-08 DIAGNOSIS — Z85828 Personal history of other malignant neoplasm of skin: Secondary | ICD-10-CM | POA: Insufficient documentation

## 2012-08-08 DIAGNOSIS — I10 Essential (primary) hypertension: Secondary | ICD-10-CM | POA: Insufficient documentation

## 2012-08-08 DIAGNOSIS — E669 Obesity, unspecified: Secondary | ICD-10-CM | POA: Insufficient documentation

## 2012-08-08 DIAGNOSIS — Z8669 Personal history of other diseases of the nervous system and sense organs: Secondary | ICD-10-CM | POA: Insufficient documentation

## 2012-08-08 DIAGNOSIS — Z8619 Personal history of other infectious and parasitic diseases: Secondary | ICD-10-CM | POA: Insufficient documentation

## 2012-08-08 DIAGNOSIS — Z7982 Long term (current) use of aspirin: Secondary | ICD-10-CM | POA: Insufficient documentation

## 2012-08-08 LAB — WET PREP, GENITAL: Clue Cells Wet Prep HPF POC: NONE SEEN

## 2012-08-08 LAB — URINALYSIS, ROUTINE W REFLEX MICROSCOPIC
Bilirubin Urine: NEGATIVE
Hgb urine dipstick: NEGATIVE
Protein, ur: NEGATIVE mg/dL
Urobilinogen, UA: 0.2 mg/dL (ref 0.0–1.0)

## 2012-08-08 LAB — GLUCOSE, CAPILLARY: Glucose-Capillary: 89 mg/dL (ref 70–99)

## 2012-08-08 MED ORDER — CEFTRIAXONE SODIUM 250 MG IJ SOLR
250.0000 mg | Freq: Once | INTRAMUSCULAR | Status: AC
  Administered 2012-08-08: 250 mg via INTRAMUSCULAR
  Filled 2012-08-08: qty 250

## 2012-08-08 MED ORDER — NEBIVOLOL HCL 5 MG PO TABS: 5.0000 mg | ORAL_TABLET | Freq: Every day | ORAL | Status: DC

## 2012-08-08 MED ORDER — FUROSEMIDE 40 MG PO TABS: 40.0000 mg | ORAL_TABLET | Freq: Every day | ORAL | Status: DC

## 2012-08-08 MED ORDER — AZITHROMYCIN 250 MG PO TABS
1000.0000 mg | ORAL_TABLET | Freq: Once | ORAL | Status: AC
  Administered 2012-08-08: 1000 mg via ORAL
  Filled 2012-08-08: qty 4

## 2012-08-08 NOTE — Telephone Encounter (Signed)
Note that pt was seen in ED for vaginal D/C- will defer to ED physician.  Notified Heather RN in ED that I will defer further refills to the ED physician who is addressing vaginitis in visit. Pt is still being evaluated in ED.

## 2012-08-08 NOTE — ED Notes (Addendum)
Pt reports she was diagnosed with a vaginal infection for 4 weeks ago, took some of medication, lost the rest and continues to have vaginal odor and d/c.  States she went to PMD this am, was late and would not be seen.  Pt is asking for RX for Lasix and Bystolic.

## 2012-08-08 NOTE — ED Provider Notes (Signed)
History     CSN: [id number]  Arrival date & time 08/08/12  1044   First MD Initiated Contact with Patient 08/08/12 1125      Chief Complaint  Patient presents with  . Vaginal Discharge    (Consider location/radiation/quality/duration/timing/severity/associated sxs/prior treatment) Patient is a 47 y.o. female presenting with vaginal discharge.  Vaginal Discharge   Pt reports she was diagnosed with BV about 3 months ago, she lost her Rx while moving and never finished the course of Flagyl. She states she has continued to have vaginal discharge since that time but was unable to get back to PCP until today but she was late for her appointment and they would not see her. She denies any bleeding or fever. No pelvic pain. Glucose has been controlled at home. She also requests refill for Lasix and Bystolic Rx.   Past Medical History  Diagnosis Date  . Palpitation     event monito 2/10  . Chest pain     negative Myoview march 2009  . HTN (hypertension)     controlled  . OSA (obstructive sleep apnea)     mild  . Obesity   . Lipoma     Hx-left leg. s/p excision  . Rectal bleeding 5/10    normal colonoscopy. Dr. Arlyce Dice  . Yeast infection     Past Surgical History  Procedure Laterality Date  . Hysterectomy unspecified area    . Abdominal hysterectomy      Family History  Problem Relation Age of Onset  . Coronary artery disease      female 1st degree relative <60/ female 1st degree <50  . Stroke      female 1st degree relatvie <50  . Allergies Sister     and daughter  . Allergy (severe) Sister   . Heart attack Sister   . Asthma Daughter   . Allergy (severe) Daughter   . Heart disease Mother   . Heart attack Mother   . Other Mother     rheumatism  . Heart disease Father   . Heart disease Paternal Grandfather   . Heart attack Paternal Grandfather   . Stroke Paternal Grandfather   . Cancer Paternal Grandfather     prostate  . Prostate cancer Maternal Grandfather   .  Other Maternal Grandmother     rheumatism    History  Substance Use Topics  . Smoking status: Never Smoker   . Smokeless tobacco: Not on file  . Alcohol Use: Yes     Comment: occasional    OB History   Grav Para Term Preterm Abortions TAB SAB Ect Mult Living                  Review of Systems  Genitourinary: Positive for vaginal discharge.   All other systems reviewed and are negative except as noted in HPI.   Allergies  Sulfonamide derivatives  Home Medications   Current Outpatient Rx  Name  Route  Sig  Dispense  Refill  . aspirin EC 81 MG tablet   Oral   Take 81 mg by mouth daily.         . furosemide (LASIX) 40 MG tablet   Oral   Take 1 tablet (40 mg total) by mouth daily.   30 tablet   2   . metFORMIN (GLUCOPHAGE-XR) 500 MG 24 hr tablet      2 tabs by mouth once daily.   60 tablet   2   . nebivolol (  BYSTOLIC) 5 MG tablet   Oral   Take 1 tablet (5 mg total) by mouth daily.   30 tablet   2   . omeprazole (PRILOSEC) 20 MG capsule   Oral   Take 1 capsule (20 mg total) by mouth daily.   30 capsule   2   . potassium chloride (MICRO-K) 10 MEQ CR capsule   Oral   Take 1 capsule (10 mEq total) by mouth daily.   30 capsule   2   . pravastatin (PRAVACHOL) 40 MG tablet   Oral   Take 1 tablet (40 mg total) by mouth daily.   30 tablet   2     BP 155/92  Pulse 68  Temp(Src) 98.4 F (36.9 C) (Oral)  Resp 18  SpO2 98%  Physical Exam  Nursing note and vitals reviewed. Constitutional: She is oriented to person, place, and time. She appears well-developed and well-nourished.  HENT:  Head: Normocephalic and atraumatic.  Eyes: EOM are normal. Pupils are equal, round, and reactive to light.  Neck: Normal range of motion. Neck supple.  Cardiovascular: Normal rate, normal heart sounds and intact distal pulses.   Pulmonary/Chest: Effort normal and breath sounds normal.  Abdominal: Bowel sounds are normal. She exhibits no distension. There is no  tenderness.  Genitourinary: No bleeding around the vagina. No foreign body around the vagina. Vaginal discharge found.  Cervix is surgically absent  Musculoskeletal: Normal range of motion. She exhibits no edema and no tenderness.  Neurological: She is alert and oriented to person, place, and time. She has normal strength. No cranial nerve deficit or sensory deficit.  Skin: Skin is warm and dry. No rash noted.  Psychiatric: She has a normal mood and affect.    ED Course  Procedures (including critical care time)  Labs Reviewed  WET PREP, GENITAL - Abnormal; Notable for the following:    WBC, Wet Prep HPF POC FEW (*)    All other components within normal limits  GC/CHLAMYDIA PROBE AMP  URINALYSIS, ROUTINE W REFLEX MICROSCOPIC   No results found.   1. Vaginal discharge       MDM  UA and wet prep negative. Pt states she is also concerned about GC/C and asked to be treated empirically. She also requests that we check her CBG before she goes. Advised close follow up at PCP office for health maintenance visits and refills.         Horrace Hanak B. Bernette Mayers, MD 08/08/12 1241

## 2012-08-08 NOTE — Telephone Encounter (Signed)
Patient had rx called in for metronidazole.  Sig read to take 3 per day for 5 days.  The pharmacy said the quantity called in was 14 rather than 15.  When she moved the medication was lost.  She only got to take it for 2 days.  Could you please call this in again for a quantity of 15

## 2012-08-08 NOTE — Telephone Encounter (Signed)
Medication px 02.27.14/SLS Please advise.

## 2012-08-20 ENCOUNTER — Telehealth: Payer: Self-pay | Admitting: *Deleted

## 2012-08-20 NOTE — Telephone Encounter (Signed)
Received fax from [employer], ph) [phone] fax) [phone] requesting order for a compounded cream for pain. Spoke with pt, she states she did not request medication, was just looking at medications on line. Advised pt request was being discarded. Denial faxed back to above #.

## 2012-08-25 ENCOUNTER — Other Ambulatory Visit: Payer: Self-pay | Admitting: Family

## 2012-08-25 ENCOUNTER — Encounter: Payer: Self-pay | Admitting: Family

## 2012-08-25 ENCOUNTER — Other Ambulatory Visit (HOSPITAL_COMMUNITY)
Admission: RE | Admit: 2012-08-25 | Discharge: 2012-08-25 | Disposition: A | Discharge status: Home / Self Care | Payer: BC Managed Care – PPO | Source: Ambulatory Visit | Attending: Family | Admitting: Family

## 2012-08-25 ENCOUNTER — Ambulatory Visit (INDEPENDENT_AMBULATORY_CARE_PROVIDER_SITE_OTHER): Payer: BC Managed Care – PPO | Admitting: Family

## 2012-08-25 VITALS — BP 140/88 | HR 67 | Temp 97.7°F | Resp 16 | Ht 66.0 in | Wt 295.1 lb

## 2012-08-25 DIAGNOSIS — E119 Type 2 diabetes mellitus without complications: Secondary | ICD-10-CM

## 2012-08-25 DIAGNOSIS — Z1231 Encounter for screening mammogram for malignant neoplasm of breast: Secondary | ICD-10-CM

## 2012-08-25 DIAGNOSIS — Z01419 Encounter for gynecological examination (general) (routine) without abnormal findings: Secondary | ICD-10-CM

## 2012-08-25 DIAGNOSIS — Z0000 Encounter for general adult medical examination without abnormal findings: Secondary | ICD-10-CM

## 2012-08-25 LAB — CBC WITH DIFFERENTIAL/PLATELET
Eosinophils Absolute: 0.1 10*3/uL (ref 0.0–0.7)
Hemoglobin: 13.6 g/dL (ref 12.0–15.0)
Lymphs Abs: 2.5 10*3/uL (ref 0.7–4.0)
MCH: 27.9 pg (ref 26.0–34.0)
Monocytes Relative: 8 % (ref 3–12)
Neutro Abs: 5.7 10*3/uL (ref 1.7–7.7)
Neutrophils Relative %: 64 % (ref 43–77)
RBC: 4.88 MIL/uL (ref 3.87–5.11)

## 2012-08-25 LAB — HEPATIC FUNCTION PANEL
Bilirubin, Direct: 0.1 mg/dL (ref 0.0–0.3)
Indirect Bilirubin: 0.3 mg/dL (ref 0.0–0.9)
Total Protein: 7.1 g/dL (ref 6.0–8.3)

## 2012-08-25 LAB — LIPID PANEL
Cholesterol: 212 mg/dL — ABNORMAL HIGH (ref 0–200)
HDL: 34 mg/dL — ABNORMAL LOW (ref >39)
Total CHOL/HDL Ratio: 6.2 Ratio
Triglycerides: 121 mg/dL (ref <150)
VLDL: 24 mg/dL (ref 0–40)

## 2012-08-25 LAB — BASIC METABOLIC PANEL WITH GFR
BUN: 11 mg/dL (ref 6–23)
GFR, Est African American: >89 mL/min
GFR, Est Non African American: 84 mL/min
Potassium: 4.4 mEq/L (ref 3.5–5.3)
Sodium: 137 mEq/L (ref 135–145)

## 2012-08-25 MED ORDER — FUROSEMIDE 40 MG PO TABS: 40.0000 mg | ORAL_TABLET | Freq: Every day | ORAL | Status: DC

## 2012-08-25 MED ORDER — NEBIVOLOL HCL 5 MG PO TABS: 5.0000 mg | ORAL_TABLET | Freq: Every day | ORAL | Status: DC

## 2012-08-25 NOTE — Assessment & Plan Note (Signed)
Will obtain fasting labs.  Due to hx of early menopause, will also obtain Dexa.  Pap performed today (vaginal wall) at pt request.  Obtain mammogram and fasting lab work.  She requests referral for eye exam. We discussed importance of healthy diet, exercise, weight loss. Declines pneumovax.

## 2012-08-25 NOTE — Addendum Note (Signed)
Addended by: Mervin Kung A on: 08/25/2012 11:29 AM   Modules accepted: Orders

## 2012-08-25 NOTE — Patient Instructions (Addendum)
Please schedule mammogram on the first floor and bone density at the front desk. Complete lab work prior to leaving. Work hard on diet/exercise/weight loss. You can keep track of your calories on myfitnesspal. Follow up in 3 months.

## 2012-08-25 NOTE — Progress Notes (Signed)
Subjective:    Patient ID: [name], female    DOB: [birth year], 47 y.o.   MRN: [id number]  HPI  Patient presents today for complete physical.  Immunizations: Tdap up to date, due for pneumovax- declines pneumovax Diet: Reports that she is walking and riding her bike Exercise:  Pap Smear: s/p  Mammogram: reports that she is due Reports menopause mid/late 30's     Review of Systems  Constitutional:       Weight gain due to less exercise.   HENT: Negative for congestion.   Respiratory: Negative for cough and shortness of breath.   Cardiovascular: Positive for leg swelling. Negative for chest pain.       Reports some palpitations last week.  Gastrointestinal: Negative for vomiting and diarrhea.  Genitourinary: Negative for dysuria, frequency and vaginal discharge.  Neurological: Negative for headaches.  Hematological: Negative for adenopathy.  Psychiatric/Behavioral:       Denies depression/anxiety   Past Medical History  Diagnosis Date  . Palpitation     event monito 2/10  . Chest pain     negative Myoview march 2009  . HTN (hypertension)     controlled  . OSA (obstructive sleep apnea)     mild  . Obesity   . Lipoma     Hx-left leg. s/p excision  . Rectal bleeding 5/10    normal colonoscopy. Dr. Arlyce Dice  . Yeast infection     History   Social History  . Marital Status: Married    Spouse Name: Iantha Fallen    Number of Children: 3  . Years of Education: N/A   Occupational History  . ADMIN     [employer]   Social History Main Topics  . Smoking status: Never Smoker   . Smokeless tobacco: Not on file  . Alcohol Use: Yes     Comment: occasional  . Drug Use: No  . Sexually Active: Not on file   Other Topics Concern  . Not on file   Social History Narrative   Married   1 daughter went to college     Past Surgical History  Procedure Laterality Date  . Hysterectomy unspecified area    . Abdominal hysterectomy      Family History   Problem Relation Age of Onset  . Coronary artery disease      female 1st degree relative <60/ female 1st degree <50  . Stroke      female 1st degree relatvie <50  . Allergies Sister     and daughter  . Allergy (severe) Sister   . Heart attack Sister   . Asthma Daughter   . Allergy (severe) Daughter   . Heart disease Mother   . Heart attack Mother   . Other Mother     rheumatism  . Heart disease Father   . Heart disease Paternal Grandfather   . Heart attack Paternal Grandfather   . Stroke Paternal Grandfather   . Cancer Paternal Grandfather     prostate  . Prostate cancer Maternal Grandfather   . Other Maternal Grandmother     rheumatism    Allergies  Allergen Reactions  . Sulfonamide Derivatives Swelling    Current Outpatient Prescriptions on File Prior to Visit  Medication Sig Dispense Refill  . aspirin EC 81 MG tablet Take 81 mg by mouth daily.      . furosemide (LASIX) 40 MG tablet Take 1 tablet (40 mg total) by mouth daily.  30 tablet  0  .  metFORMIN (GLUCOPHAGE-XR) 500 MG 24 hr tablet 2 tabs by mouth once daily.  60 tablet  2  . nebivolol (BYSTOLIC) 5 MG tablet Take 1 tablet (5 mg total) by mouth daily.  30 tablet  0  . omeprazole (PRILOSEC) 20 MG capsule Take 1 capsule (20 mg total) by mouth daily.  30 capsule  2  . potassium chloride (MICRO-K) 10 MEQ CR capsule Take 1 capsule (10 mEq total) by mouth daily.  30 capsule  2  . pravastatin (PRAVACHOL) 40 MG tablet Take 1 tablet (40 mg total) by mouth daily.  30 tablet  2   No current facility-administered medications on file prior to visit.    BP 140/88  Pulse 67  Temp(Src) 97.7 F (36.5 C) (Oral)  Resp 16  Ht 5\' 6"  (1.676 m)  Wt 295 lb 1.9 oz (133.866 kg)  BMI 47.66 kg/m2  SpO2 96%       Objective:   Physical Exam  Physical Exam  Constitutional: She is oriented to person, place, and time. Morbidly obese AA female in  No distress.  HENT:  Head: Normocephalic and atraumatic.  Right Ear: Tympanic  membrane and ear canal normal.  Left Ear: Tympanic membrane and ear canal normal.  Mouth/Throat: Oropharynx is clear and moist.  Eyes: Pupils are equal, round, and reactive to light. No scleral icterus.  Neck: Normal range of motion. No thyromegaly present.  Cardiovascular: Normal rate and regular rhythm.   No murmur heard. Pulmonary/Chest: Effort normal and breath sounds normal. No respiratory distress. He has no wheezes. She has no rales. She exhibits no tenderness.  Abdominal: Soft. Bowel sounds are normal. He exhibits no distension and no mass. There is no tenderness. There is no rebound and no guarding.  Musculoskeletal: She exhibits no edema.  Lymphadenopathy:    She has no cervical adenopathy.  Neurological: She is alert and oriented to person, place, and time.  She exhibits normal muscle tone. Coordination normal.  Skin: Skin is warm and dry.  Psychiatric: She has a normal mood and affect. Her behavior is normal. Judgment and thought content normal.  Breasts: Examined lying Right: Without masses, retractions, discharge or axillary adenopathy.  Left: Without masses, retractions, discharge or axillary adenopathy.  Inguinal/mons: Normal without inguinal adenopathy  External genitalia: Normal  BUS/Urethra/Skene's glands: Normal  Bladder: Normal  Vagina: Normal  Cervix: surgically absent Uterus: surgically absent Adnexa/parametria:  Rt: Without masses or tenderness.  Lt: Without masses or tenderness.  Anus and perineum: Normal           Assessment & Plan:          Assessment & Plan:

## 2012-08-26 ENCOUNTER — Encounter: Payer: Self-pay | Admitting: Family

## 2012-08-26 LAB — URINALYSIS, ROUTINE W REFLEX MICROSCOPIC
Bilirubin Urine: NEGATIVE
Glucose, UA: NEGATIVE mg/dL
Hgb urine dipstick: NEGATIVE
Ketones, ur: NEGATIVE mg/dL
Protein, ur: NEGATIVE mg/dL

## 2012-10-02 ENCOUNTER — Other Ambulatory Visit: Payer: BC Managed Care – PPO

## 2012-10-02 ENCOUNTER — Ambulatory Visit (HOSPITAL_BASED_OUTPATIENT_CLINIC_OR_DEPARTMENT_OTHER): Payer: BC Managed Care – PPO

## 2012-10-02 ENCOUNTER — Inpatient Hospital Stay (HOSPITAL_BASED_OUTPATIENT_CLINIC_OR_DEPARTMENT_OTHER): Admission: RE | Admit: 2012-10-02 | Payer: BC Managed Care – PPO | Source: Ambulatory Visit

## 2012-10-06 ENCOUNTER — Other Ambulatory Visit: Payer: BC Managed Care – PPO

## 2012-10-06 ENCOUNTER — Ambulatory Visit (HOSPITAL_BASED_OUTPATIENT_CLINIC_OR_DEPARTMENT_OTHER): Payer: BC Managed Care – PPO

## 2012-10-08 ENCOUNTER — Ambulatory Visit (HOSPITAL_BASED_OUTPATIENT_CLINIC_OR_DEPARTMENT_OTHER): Payer: BC Managed Care – PPO

## 2012-10-09 ENCOUNTER — Other Ambulatory Visit: Payer: BC Managed Care – PPO

## 2012-10-09 ENCOUNTER — Ambulatory Visit (HOSPITAL_BASED_OUTPATIENT_CLINIC_OR_DEPARTMENT_OTHER): Payer: BC Managed Care – PPO

## 2012-10-15 ENCOUNTER — Other Ambulatory Visit: Payer: BC Managed Care – PPO

## 2012-10-15 ENCOUNTER — Ambulatory Visit (HOSPITAL_BASED_OUTPATIENT_CLINIC_OR_DEPARTMENT_OTHER): Payer: BC Managed Care – PPO

## 2012-10-17 ENCOUNTER — Other Ambulatory Visit: Payer: BC Managed Care – PPO

## 2012-10-17 ENCOUNTER — Ambulatory Visit (HOSPITAL_BASED_OUTPATIENT_CLINIC_OR_DEPARTMENT_OTHER): Payer: BC Managed Care – PPO

## 2012-10-21 ENCOUNTER — Inpatient Hospital Stay: Admission: RE | Admit: 2012-10-21 | Payer: BC Managed Care – PPO | Source: Ambulatory Visit

## 2012-10-22 ENCOUNTER — Ambulatory Visit (HOSPITAL_BASED_OUTPATIENT_CLINIC_OR_DEPARTMENT_OTHER): Payer: BC Managed Care – PPO

## 2012-11-25 ENCOUNTER — Ambulatory Visit (INDEPENDENT_AMBULATORY_CARE_PROVIDER_SITE_OTHER): Payer: BC Managed Care – PPO | Admitting: Family

## 2012-11-25 ENCOUNTER — Encounter: Payer: Self-pay | Admitting: Family

## 2012-11-25 VITALS — BP 140/100 | HR 80 | Temp 98.3°F | Ht 66.0 in | Wt 308.1 lb

## 2012-11-25 DIAGNOSIS — N898 Other specified noninflammatory disorders of vagina: Secondary | ICD-10-CM

## 2012-11-25 DIAGNOSIS — I10 Essential (primary) hypertension: Secondary | ICD-10-CM

## 2012-11-25 DIAGNOSIS — G4733 Obstructive sleep apnea (adult) (pediatric): Secondary | ICD-10-CM

## 2012-11-25 DIAGNOSIS — E119 Type 2 diabetes mellitus without complications: Secondary | ICD-10-CM

## 2012-11-25 DIAGNOSIS — R0789 Other chest pain: Secondary | ICD-10-CM

## 2012-11-25 DIAGNOSIS — R079 Chest pain, unspecified: Secondary | ICD-10-CM

## 2012-11-25 DIAGNOSIS — N760 Acute vaginitis: Secondary | ICD-10-CM | POA: Insufficient documentation

## 2012-11-25 LAB — BASIC METABOLIC PANEL
CO2: 30 mEq/L (ref 19–32)
Chloride: 102 mEq/L (ref 96–112)
Sodium: 137 mEq/L (ref 135–145)

## 2012-11-25 LAB — HEMOGLOBIN A1C: Mean Plasma Glucose: 117 mg/dL — ABNORMAL HIGH (ref <117)

## 2012-11-25 MED ORDER — FUROSEMIDE 40 MG PO TABS: 40.0000 mg | ORAL_TABLET | Freq: Every day | ORAL | Status: DC

## 2012-11-25 MED ORDER — POTASSIUM CHLORIDE ER 10 MEQ PO CPCR: 10.0000 meq | ORAL_CAPSULE | Freq: Every day | ORAL | Status: DC

## 2012-11-25 MED ORDER — NEBIVOLOL HCL 5 MG PO TABS: 5.0000 mg | ORAL_TABLET | Freq: Every day | ORAL | Status: DC

## 2012-11-25 MED ORDER — OMEPRAZOLE 20 MG PO CPDR: 20.0000 mg | DELAYED_RELEASE_CAPSULE | Freq: Every day | ORAL | Status: DC

## 2012-11-25 MED ORDER — ALBUTEROL SULFATE HFA 108 (90 BASE) MCG/ACT IN AERS: 2.0000 | INHALATION_SPRAY | Freq: Four times a day (QID) | RESPIRATORY_TRACT | Status: DC | PRN

## 2012-11-25 MED ORDER — METFORMIN HCL ER 500 MG PO TB24: ORAL_TABLET | ORAL | Status: AC

## 2012-11-25 MED ORDER — PRAVASTATIN SODIUM 40 MG PO TABS: 40.0000 mg | ORAL_TABLET | Freq: Every day | ORAL | Status: DC

## 2012-11-25 NOTE — Assessment & Plan Note (Signed)
Associated with wheezing, chest congestion. EKG performed today in the office reveals normal sinus rhythm without acute ischemic changes.  Will give trial of prn albuterol.  Also, given her multiple risk factors, will refer for stress test.  She is instructed to go to the ED if she develops worsening chest pain.

## 2012-11-25 NOTE — Assessment & Plan Note (Signed)
Wet prep and gc/Chlamydia probe obtained today. Further rx recommendations pending these results.

## 2012-11-25 NOTE — Assessment & Plan Note (Signed)
Non-compliant with metformin, diet or exercise. She has gained 13 pounds since her last visit.  I have urged her to work on diabetic diet, weight loss med compliance.

## 2012-11-25 NOTE — Assessment & Plan Note (Signed)
Deteriorated reinforced importance of med compliance.  Obtain bmet.

## 2012-11-25 NOTE — Patient Instructions (Addendum)
Schedule bone density at the front desk and mammogram on the first floor. Please complete your lab work prior to leaving.  Restart all your medications and CPAP. You will be contacted about your stress test. Go to ER if you develop worsening chest pain. Follow up with [place] in 1 month.

## 2012-11-25 NOTE — Assessment & Plan Note (Signed)
Recommended CPAP compliance.

## 2012-11-25 NOTE — Progress Notes (Signed)
Subjective:    Patient ID: [name], female    DOB: 12/17/1965, 47 y.o.   MRN: [id number]  HPI  [name] is a 47 yr old female who presents today for follow up of multiple medical problems.    HTN- reports that she hasn't taken her medication today.    DM2- Reports that she has not been taking her metformin periodically due to it "making my legs hurt." gained weight due to poor eating habits and lack of exercise.   OSA- She has a CPAP machine- has not been using. Reports that she has not been using regularly.    Reports + substernal chest pain for several days.  She reports pain radiates down the right arm.  Reports associated chest congestion and intermittent wheezing which is worse at night. Pain is intermittent and not worsened by activity.  Bacterial vaginosis-  Notes some discharge and odor.  She denies fever. Didn't complete the rx we gave her back in February.    Review of Systems See HPI  Past Medical History  Diagnosis Date  . Palpitation     event monito 2/10  . Chest pain     negative Myoview march 2009  . HTN (hypertension)     controlled  . OSA (obstructive sleep apnea)     mild  . Obesity   . Lipoma     Hx-left leg. s/p excision  . Rectal bleeding 5/10    normal colonoscopy. Dr. Arlyce Dice  . Yeast infection     History   Social History  . Marital Status: Married    Spouse Name: Iantha Fallen    Number of Children: 3  . Years of Education: N/A   Occupational History  . ADMIN     [employer]   Social History Main Topics  . Smoking status: Never Smoker   . Smokeless tobacco: Not on file  . Alcohol Use: Yes     Comment: occasional  . Drug Use: No  . Sexual Activity: Not on file   Other Topics Concern  . Not on file   Social History Narrative   Married   1 daughter went to college     Past Surgical History  Procedure Laterality Date  . Hysterectomy unspecified area    . Abdominal hysterectomy      Family History  Problem  Relation Age of Onset  . Coronary artery disease      female 1st degree relative <60/ female 1st degree <50  . Stroke      female 1st degree relatvie <50  . Allergies Sister     and daughter  . Allergy (severe) Sister   . Heart attack Sister   . Asthma Daughter   . Allergy (severe) Daughter   . Heart disease Mother   . Heart attack Mother   . Other Mother     rheumatism  . Heart disease Father   . Heart disease Paternal Grandfather   . Heart attack Paternal Grandfather   . Stroke Paternal Grandfather   . Cancer Paternal Grandfather     prostate  . Prostate cancer Maternal Grandfather   . Other Maternal Grandmother     rheumatism    Allergies  Allergen Reactions  . Sulfonamide Derivatives Swelling    Current Outpatient Prescriptions on File Prior to Visit  Medication Sig Dispense Refill  . aspirin EC 81 MG tablet Take 81 mg by mouth daily.      . furosemide (LASIX) 40 MG tablet  Take 1 tablet (40 mg total) by mouth daily.  30 tablet  3  . metFORMIN (GLUCOPHAGE-XR) 500 MG 24 hr tablet 2 tabs by mouth once daily.  60 tablet  2  . nebivolol (BYSTOLIC) 5 MG tablet Take 1 tablet (5 mg total) by mouth daily.  30 tablet  3  . omeprazole (PRILOSEC) 20 MG capsule Take 1 capsule (20 mg total) by mouth daily.  30 capsule  2  . potassium chloride (MICRO-K) 10 MEQ CR capsule Take 1 capsule (10 mEq total) by mouth daily.  30 capsule  2  . pravastatin (PRAVACHOL) 40 MG tablet Take 1 tablet (40 mg total) by mouth daily.  30 tablet  2   No current facility-administered medications on file prior to visit.    BP 140/100  Pulse 80  Temp(Src) 98.3 F (36.8 C) (Oral)  Ht 5\' 6"  (1.676 m)  Wt 308 lb 1.3 oz (139.744 kg)  BMI 49.75 kg/m2  SpO2 97%       Objective:   Physical Exam  Constitutional:  Morbidly obese AA female in NAD  HENT:  Head: Normocephalic and atraumatic.  Cardiovascular: Normal rate and regular rhythm.   No murmur heard. Pulmonary/Chest: Effort normal and breath  sounds normal. No respiratory distress. She has no wheezes. She has no rales. She exhibits no tenderness.  Abdominal: Soft. Bowel sounds are normal.  Genitourinary:  Foul smelling vaginal discharge is noted.   Musculoskeletal: She exhibits no edema.  Neurological: She is alert.  Skin: Skin is warm and dry.  Psychiatric: She has a normal mood and affect. Her behavior is normal. Judgment and thought content normal.          Assessment & Plan:

## 2012-11-26 ENCOUNTER — Telehealth: Payer: Self-pay | Admitting: *Deleted

## 2012-11-26 NOTE — Telephone Encounter (Signed)
Message copied by Kathi Simpers on Wed Nov 26, 2012 10:21 AM ------      Message from: O'SULLIVAN, MELISSA      Created: Wed Nov 26, 2012  9:57 AM       Could you please check with the lab and see if they received a wet prep specimen on her?  This was to be sent along with the GC/Chlamydia. ------

## 2012-11-26 NOTE — Telephone Encounter (Signed)
Spoke with [name] at [facility]. She states they received order for GC/chlamydia and additional specimen in the bag with no name and no order. She states the specimens go to different departments for testing and they will be unable to do any testing on unlabeled specimen. Spoke with pt, she will return to the office on Friday at 3:15pm for recollection of wet prep.   Marj, please do not take co-pay at upcoming visit on Friday. Per Provider, visit will be no charge.

## 2012-11-27 LAB — GC/CHLAMYDIA PROBE AMP: GC Probe RNA: NEGATIVE

## 2012-11-28 ENCOUNTER — Ambulatory Visit (INDEPENDENT_AMBULATORY_CARE_PROVIDER_SITE_OTHER): Payer: BC Managed Care – PPO | Admitting: Family

## 2012-11-28 DIAGNOSIS — N760 Acute vaginitis: Secondary | ICD-10-CM

## 2012-11-28 NOTE — Progress Notes (Signed)
  Subjective:    Patient ID: [name], female    DOB: 02/09/1966, 47 y.o.   MRN: [id number]  HPI    Review of Systems     Objective:   Physical Exam        Assessment & Plan:  Pt presents today for repeat wet prep.  Initial sample was not able to be processed by lab.  She requested info on healthy diet.  I gave pt info on a 1200 calorie diet.  Also gave pt info on her upcoming stress test apt.

## 2012-11-28 NOTE — Addendum Note (Signed)
Addended by: Mervin Kung A on: 11/28/2012 04:14 PM   Modules accepted: Orders

## 2012-11-29 ENCOUNTER — Telehealth: Payer: Self-pay | Admitting: Family

## 2012-11-29 LAB — WET PREP BY MOLECULAR PROBE
Candida species: NEGATIVE
Trichomonas vaginosis: POSITIVE — AB

## 2012-11-29 MED ORDER — METRONIDAZOLE 500 MG PO TABS: 500.0000 mg | ORAL_TABLET | Freq: Two times a day (BID) | ORAL | Status: DC

## 2012-11-29 NOTE — Telephone Encounter (Signed)
Please call pt and let he know that her wet prep showed trichomonas.  She and her partner should be treated to prevent re-infection.  Rx sent for metronidazole.

## 2012-12-01 NOTE — Telephone Encounter (Signed)
Left message on voicemail to return my call.  

## 2012-12-01 NOTE — Telephone Encounter (Signed)
Patient returned phone call. Best # [phone]

## 2012-12-02 NOTE — Telephone Encounter (Signed)
Notified pt and she voices understanding. 

## 2012-12-10 ENCOUNTER — Encounter: Payer: Self-pay | Admitting: Internal Medicine

## 2012-12-10 ENCOUNTER — Ambulatory Visit (HOSPITAL_COMMUNITY): Payer: BC Managed Care – PPO | Attending: Family | Admitting: Radiology

## 2012-12-10 VITALS — BP 126/81 | HR 68 | Ht 66.0 in | Wt 303.0 lb

## 2012-12-10 DIAGNOSIS — R0602 Shortness of breath: Secondary | ICD-10-CM

## 2012-12-10 DIAGNOSIS — I10 Essential (primary) hypertension: Secondary | ICD-10-CM | POA: Insufficient documentation

## 2012-12-10 DIAGNOSIS — E119 Type 2 diabetes mellitus without complications: Secondary | ICD-10-CM | POA: Insufficient documentation

## 2012-12-10 DIAGNOSIS — R5381 Other malaise: Secondary | ICD-10-CM | POA: Insufficient documentation

## 2012-12-10 DIAGNOSIS — R0609 Other forms of dyspnea: Secondary | ICD-10-CM | POA: Insufficient documentation

## 2012-12-10 DIAGNOSIS — E669 Obesity, unspecified: Secondary | ICD-10-CM | POA: Insufficient documentation

## 2012-12-10 DIAGNOSIS — Z8249 Family history of ischemic heart disease and other diseases of the circulatory system: Secondary | ICD-10-CM | POA: Insufficient documentation

## 2012-12-10 DIAGNOSIS — R002 Palpitations: Secondary | ICD-10-CM | POA: Insufficient documentation

## 2012-12-10 DIAGNOSIS — R0989 Other specified symptoms and signs involving the circulatory and respiratory systems: Secondary | ICD-10-CM | POA: Insufficient documentation

## 2012-12-10 DIAGNOSIS — R079 Chest pain, unspecified: Secondary | ICD-10-CM

## 2012-12-10 MED ORDER — TECHNETIUM TC 99M SESTAMIBI GENERIC - CARDIOLITE
30.0000 | Freq: Once | INTRAVENOUS | Status: AC | PRN
  Administered 2012-12-10: 30 via INTRAVENOUS

## 2012-12-10 NOTE — Progress Notes (Signed)
[facility] SITE 3 NUCLEAR MED [address] [place], [place] [address] [phone]    Cardiology Nuclear Med Study  [name] is a 47 y.o. female     MRN : [id number]     DOB: 1965/06/10  Procedure Date: 12/10/2012  Nuclear Med Background Indication for Stress Test:  Evaluation for Ischemia History: No prior known history of CAD, 06-2007 Myocardial Perfusion Study-No ischemia, EF=75%, 07-2008 Echo: EF=60% Cardiac Risk Factors: Family History - CAD, Hypertension, NIDDM and Obesity  Symptoms:Chest Pain/Chest Tightness/Chest Pressure with/without exertion (last occurrence this am),   DOE, Fatigue, Fatigue with Exertion and Palpitations   Nuclear Pre-Procedure Caffeine/Decaff Intake:  None > 12 hrs NPO After: 6:00am   Lungs:  clear O2 Sat: 98% on room air. IV 0.9% NS with Angio Cath:  22g  IV Site: R Forearm x 1, tolerated well IV Started by:  Irean Hong, RN  Chest Size (in):  46 Cup Size: DDD  Height: 5\' 6"  (1.676 m)  Weight:  303 lb (137.44 kg)  BMI:  Body mass index is 48.93 kg/(m^2). Tech Comments:  Held Bystolic x 24 hrs    Nuclear Med Study 1 or 2 day study: 2 day  Stress Test Type:  Stress  Reading MD: Kristeen Miss, MD  Order Authorizing Provider:  Danise Edge, MD, and Sandford Craze, NP  Resting Radionuclide: Technetium 75m Sestamibi  Resting Radionuclide Dose: 33.0 mCi  12/18/12  Stress Radionuclide:  Technetium 12m Sestamibi  Stress Radionuclide Dose: 33.0 mCi   12/10/12          Stress Protocol Rest HR: 68 Stress HR: 157  Rest BP: 126/81 Stress BP: 218/100  Exercise Time (min): 5:00 METS: 6.5   Predicted Max HR: 173 bpm % Max HR: 90.75 bpm Rate Pressure Product: 21308   Dose of Adenosine (mg):  n/a Dose of Lexiscan: n/a mg  Dose of Atropine (mg): n/a Dose of Dobutamine: n/a mcg/kg/min (at max HR)  Stress Test Technologist: Irean Hong, RN  Nuclear Technologist:  Doyne Keel, CNMT     Rest Procedure:  Myocardial perfusion imaging  was performed at rest 45 minutes following the intravenous administration of Technetium 12m Sestamibi. Rest ECG: NSR - Normal EKG  Stress Procedure:  The patient exercised on the treadmill utilizing the Bruce Protocol for 5:00 minutes, RPE=15. The patient stopped due to DOE and denied any chest pain.  There was a marked hypertensive response to exercise, off bystolic x 24 hrs. Technetium 8m Sestamibi was injected at peak exercise and myocardial perfusion imaging was performed after a brief delay. Stress ECG: No significant change from baseline ECG  QPS Raw Data Images:  Normal; no motion artifact; normal heart/lung ratio. Stress Images:  Normal homogeneous uptake in all areas of the myocardium. Rest Images:  Normal homogeneous uptake in all areas of the myocardium. Subtraction (SDS):  No evidence of ischemia. Transient Ischemic Dilatation (Normal <1.22):  n/a Lung/Heart Ratio (Normal <0.45):  0.27  Quantitative Gated Spect Images QGS EDV:  95 ml QGS ESV:  29 ml  Impression Exercise Capacity:  Fair exercise capacity. BP Response:  Hypertensive blood pressure response. Clinical Symptoms:  No chest pain. ECG Impression:  No significant ST segment change suggestive of ischemia. Comparison with Prior Nuclear Study: No images to compare  Overall Impression:  Normal stress nuclear study.  LV Ejection Fraction: 70%.  LV Wall Motion:  NL LV Function; NL Wall Motion   [employer]

## 2012-12-18 ENCOUNTER — Ambulatory Visit (HOSPITAL_COMMUNITY): Payer: BC Managed Care – PPO | Attending: Cardiology

## 2012-12-18 DIAGNOSIS — R0989 Other specified symptoms and signs involving the circulatory and respiratory systems: Secondary | ICD-10-CM

## 2012-12-18 MED ORDER — TECHNETIUM TC 99M SESTAMIBI GENERIC - CARDIOLITE
33.0000 | Freq: Once | INTRAVENOUS | Status: AC | PRN
  Administered 2012-12-18: 33 via INTRAVENOUS

## 2012-12-26 ENCOUNTER — Ambulatory Visit: Payer: BC Managed Care – PPO | Admitting: Family

## 2012-12-29 ENCOUNTER — Other Ambulatory Visit: Payer: Self-pay | Admitting: Family

## 2012-12-29 ENCOUNTER — Ambulatory Visit (INDEPENDENT_AMBULATORY_CARE_PROVIDER_SITE_OTHER): Payer: BC Managed Care – PPO | Admitting: Family

## 2012-12-29 ENCOUNTER — Ambulatory Visit (HOSPITAL_BASED_OUTPATIENT_CLINIC_OR_DEPARTMENT_OTHER)
Admission: RE | Admit: 2012-12-29 | Discharge: 2012-12-29 | Disposition: A | Discharge status: Home / Self Care | Payer: BC Managed Care – PPO | Source: Ambulatory Visit | Attending: Family | Admitting: Family

## 2012-12-29 VITALS — BP 136/82 | Temp 98.3°F | Wt 303.8 lb

## 2012-12-29 DIAGNOSIS — Z0000 Encounter for general adult medical examination without abnormal findings: Secondary | ICD-10-CM

## 2012-12-29 DIAGNOSIS — Z1231 Encounter for screening mammogram for malignant neoplasm of breast: Secondary | ICD-10-CM | POA: Insufficient documentation

## 2012-12-29 DIAGNOSIS — N760 Acute vaginitis: Secondary | ICD-10-CM

## 2012-12-29 DIAGNOSIS — R300 Dysuria: Secondary | ICD-10-CM

## 2012-12-29 DIAGNOSIS — I10 Essential (primary) hypertension: Secondary | ICD-10-CM

## 2012-12-29 DIAGNOSIS — E785 Hyperlipidemia, unspecified: Secondary | ICD-10-CM

## 2012-12-29 DIAGNOSIS — E119 Type 2 diabetes mellitus without complications: Secondary | ICD-10-CM

## 2012-12-29 LAB — BASIC METABOLIC PANEL
BUN: 17 mg/dL (ref 6–23)
CO2: 30 mEq/L (ref 19–32)
Calcium: 9.4 mg/dL (ref 8.4–10.5)
Creat: 0.98 mg/dL (ref 0.50–1.10)

## 2012-12-29 LAB — LIPID PANEL
HDL: 32 mg/dL — ABNORMAL LOW (ref >39)
Triglycerides: 182 mg/dL — ABNORMAL HIGH (ref <150)

## 2012-12-29 MED ORDER — METRONIDAZOLE 500 MG PO TABS: 500.0000 mg | ORAL_TABLET | Freq: Two times a day (BID) | ORAL | Status: DC

## 2012-12-29 NOTE — Patient Instructions (Addendum)
Please complete your lab work prior to leaving. Follow up in 3 months, sooner if problems/concerns.  You will be contacted about your referral to the eye doctor.  Please let us know if you have not heard back within 1 week about your referral.

## 2012-12-29 NOTE — Progress Notes (Signed)
Subjective:    Patient ID: [name], female    DOB: 12/20/1965, 47 y.o.   MRN: [id number]  HPI  Ms. Socarras is a 47 yr old female who presents today for follow up of multiple medical problems:  DM2-  Last A1C 5.7.  HTN-  Currently maintained on bystolic, lasix  Hyperlipidemia-  Currently maintained on pravastatin- last ldl was above goal at 154.   She had her mammogram. She has started walking more.   Trichomonas Vaginitis- notes that she did not finish the metronidazole last month. Continues to have some vaginal discharge and irritation.  Has not had further sexual contact with her previous partner.   Review of Systems    see HPI  Past Medical History  Diagnosis Date  . Palpitation     event monito 2/10  . Chest pain     negative Myoview march 2009  . HTN (hypertension)     controlled  . OSA (obstructive sleep apnea)     mild  . Obesity   . Lipoma     Hx-left leg. s/p excision  . Rectal bleeding 5/10    normal colonoscopy. Dr. Arlyce Dice  . Yeast infection     History   Social History  . Marital Status: Married    Spouse Name: Iantha Fallen    Number of Children: 3  . Years of Education: N/A   Occupational History  . ADMIN     [employer]   Social History Main Topics  . Smoking status: Never Smoker   . Smokeless tobacco: Not on file  . Alcohol Use: Yes     Comment: occasional  . Drug Use: No  . Sexual Activity: Not on file   Other Topics Concern  . Not on file   Social History Narrative   Married   1 daughter went to college     Past Surgical History  Procedure Laterality Date  . Hysterectomy unspecified area    . Abdominal hysterectomy      Family History  Problem Relation Age of Onset  . Coronary artery disease      female 1st degree relative <60/ female 1st degree <50  . Stroke      female 1st degree relatvie <50  . Allergies Sister     and daughter  . Allergy (severe) Sister   . Heart attack Sister   . Asthma Daughter   .  Allergy (severe) Daughter   . Heart disease Mother   . Heart attack Mother   . Other Mother     rheumatism  . Heart disease Father   . Heart disease Paternal Grandfather   . Heart attack Paternal Grandfather   . Stroke Paternal Grandfather   . Cancer Paternal Grandfather     prostate  . Prostate cancer Maternal Grandfather   . Other Maternal Grandmother     rheumatism    Allergies  Allergen Reactions  . Sulfonamide Derivatives Swelling    Current Outpatient Prescriptions on File Prior to Visit  Medication Sig Dispense Refill  . albuterol (PROVENTIL HFA;VENTOLIN HFA) 108 (90 BASE) MCG/ACT inhaler Inhale 2 puffs into the lungs every 6 (six) hours as needed for wheezing.  1 Inhaler  0  . aspirin EC 81 MG tablet Take 81 mg by mouth daily.      . furosemide (LASIX) 40 MG tablet Take 1 tablet (40 mg total) by mouth daily.  30 tablet  3  . metFORMIN (GLUCOPHAGE-XR) 500 MG 24 hr tablet 2  tabs by mouth once daily.  60 tablet  2  . nebivolol (BYSTOLIC) 5 MG tablet Take 1 tablet (5 mg total) by mouth daily.  30 tablet  3  . omeprazole (PRILOSEC) 20 MG capsule Take 1 capsule (20 mg total) by mouth daily.  30 capsule  2  . potassium chloride (MICRO-K) 10 MEQ CR capsule Take 1 capsule (10 mEq total) by mouth daily.  30 capsule  2   No current facility-administered medications on file prior to visit.    BP 136/82  Temp(Src) 98.3 F (36.8 C) (Oral)  Wt 303 lb 12 oz (137.78 kg)  BMI 49.05 kg/m2    Objective:   Physical Exam  Constitutional: She is oriented to person, place, and time. She appears well-developed and well-nourished.  HENT:  Head: Normocephalic and atraumatic.  Cardiovascular: Normal rate and regular rhythm.   No murmur heard. Pulmonary/Chest: Effort normal and breath sounds normal. No respiratory distress. She has no wheezes. She has no rales. She exhibits no tenderness.  Musculoskeletal: She exhibits no edema.  Neurological: She is alert and oriented to person, place,  and time.  Psychiatric: She has a normal mood and affect. Her behavior is normal. Judgment and thought content normal.          Assessment & Plan:

## 2012-12-30 ENCOUNTER — Other Ambulatory Visit: Payer: Self-pay | Admitting: Family

## 2012-12-30 DIAGNOSIS — E785 Hyperlipidemia, unspecified: Secondary | ICD-10-CM

## 2012-12-30 LAB — URINALYSIS, ROUTINE W REFLEX MICROSCOPIC
Bilirubin Urine: NEGATIVE
Hgb urine dipstick: NEGATIVE
Leukocytes, UA: NEGATIVE
Nitrite: NEGATIVE
Protein, ur: NEGATIVE mg/dL
Specific Gravity, Urine: 1.02 (ref 1.005–1.030)
Urobilinogen, UA: 1 mg/dL (ref 0.0–1.0)

## 2012-12-30 LAB — URINALYSIS, MICROSCOPIC ONLY: Squamous Epithelial / LPF: NONE SEEN

## 2012-12-30 LAB — URINE CULTURE
Colony Count: NO GROWTH
Organism ID, Bacteria: NO GROWTH

## 2012-12-30 LAB — HEMOGLOBIN A1C
Hgb A1c MFr Bld: 6.4 % — ABNORMAL HIGH (ref <5.7)
Mean Plasma Glucose: 137 mg/dL — ABNORMAL HIGH (ref <117)

## 2012-12-30 NOTE — Assessment & Plan Note (Signed)
BP Readings from Last 3 Encounters:  12/29/12 136/82  12/10/12 126/81  11/25/12 140/100   BP stable, continue current meds.

## 2012-12-30 NOTE — Assessment & Plan Note (Signed)
Follow up A1C is 6.4.  Continue metformin.  Monitor.

## 2012-12-30 NOTE — Assessment & Plan Note (Signed)
Retreat for trichomonas given pt did not complete therapy and has persistent symptoms. Advised pt to follow up if symptoms do not resolve after completion of metronidazole.

## 2012-12-30 NOTE — Telephone Encounter (Signed)
Please let pt know that sugar control has worsened slightly but still within goal range.  Cholesterol remains elevated. I would recommend that she stop pravastatin and start atorvastatin daily. Pended below. Repeat FLP/LFT in 6 weeks (dx hyperlipidemia) call if unusual muscle pain develops while on this medications.

## 2012-12-30 NOTE — Assessment & Plan Note (Signed)
LDL above goal at 126. Will change pravastatin 40 to atorvastatin 40mg .

## 2012-12-31 NOTE — Telephone Encounter (Signed)
Left message on home # to return my call. 

## 2013-01-01 NOTE — Telephone Encounter (Signed)
Notified pt. She voices understanding and states she has only been taking Pravastatin 1-2 x weekly.  Advised pt she should take it once a day every day. Pt voices understanding. Please advise if pt should continue Pravastatin and return for repeat labs in 6 weeks?

## 2013-01-01 NOTE — Telephone Encounter (Signed)
Continue pravastatin- take every evening.  Repeat labs in 6 weeks.

## 2013-01-02 NOTE — Telephone Encounter (Signed)
Left detailed message on home# and to call if any questions; cancelled previous rx for atorvastatin. Lab order entered.

## 2013-03-24 ENCOUNTER — Ambulatory Visit: Payer: BC Managed Care – PPO | Admitting: Family

## 2013-03-30 ENCOUNTER — Telehealth: Payer: Self-pay | Admitting: Family

## 2013-03-30 NOTE — Telephone Encounter (Signed)
Patient states that she could not get out of work tomorrow for her appointment. She scheduled appointment for 04/13/13. She would like to know if Melissa would call her in enough lasix and bystolic to last her until that appointment? Patient is requesting a callback on this [phone]. [facility]

## 2013-03-30 NOTE — Telephone Encounter (Signed)
Patient called back stating that she had enough medication to last her until this appointment.

## 2013-03-31 ENCOUNTER — Ambulatory Visit: Payer: BC Managed Care – PPO | Admitting: Family

## 2013-04-13 ENCOUNTER — Ambulatory Visit (INDEPENDENT_AMBULATORY_CARE_PROVIDER_SITE_OTHER): Payer: BC Managed Care – PPO | Admitting: Family

## 2013-04-13 ENCOUNTER — Encounter: Payer: Self-pay | Admitting: Family

## 2013-04-13 VITALS — BP 146/98 | HR 71 | Temp 98.1°F | Resp 18 | Ht 66.0 in | Wt 310.0 lb

## 2013-04-13 DIAGNOSIS — G4733 Obstructive sleep apnea (adult) (pediatric): Secondary | ICD-10-CM

## 2013-04-13 DIAGNOSIS — E119 Type 2 diabetes mellitus without complications: Secondary | ICD-10-CM

## 2013-04-13 DIAGNOSIS — J3489 Other specified disorders of nose and nasal sinuses: Secondary | ICD-10-CM

## 2013-04-13 DIAGNOSIS — I10 Essential (primary) hypertension: Secondary | ICD-10-CM

## 2013-04-13 NOTE — Assessment & Plan Note (Signed)
Would like referral for optifast.   Wt Readings from Last 3 Encounters:  04/13/13 310 lb (140.615 kg)  12/29/12 303 lb 12 oz (137.78 kg)  12/10/12 303 lb (137.44 kg)

## 2013-04-13 NOTE — Progress Notes (Signed)
Pre visit review using our clinic review tool, if applicable. No additional management support is needed unless otherwise documented below in the visit note. 

## 2013-04-13 NOTE — Progress Notes (Addendum)
Subjective:    Patient ID: [name], female    DOB: 1965-07-28, 48 y.o.   MRN: [id number]  HPI  [name] is a 48 yr old female who presents today for follow up.  1) HTN- did not take her meds tonight.  Has not been regular. Request refill for potassium.   2) DM2-reports taking metformin.    3) OSA- reports that she has not been using regularly but plans to restart.  4) reports + sore in her right nare (scab).   5) Insect bite- reports that this occurred 2 weeks ago.  Located on the posterior right thigh.   Pt is interested in referral to optifast program. For weight loss. Review of Systems See HPI  Past Medical History  Diagnosis Date  . Palpitation     event monito 2/10  . Chest pain     negative Myoview march 2009  . HTN (hypertension)     controlled  . OSA (obstructive sleep apnea)     mild  . Obesity   . Lipoma     Hx-left leg. s/p excision  . Rectal bleeding 5/10    normal colonoscopy. Dr. Arlyce DiceKaplan  . Yeast infection     History   Social History  . Marital Status: Married    Spouse Name: Iantha FallenKenneth    Number of Children: 3  . Years of Education: N/A   Occupational History  . ADMIN     [employer]   Social History Main Topics  . Smoking status: Never Smoker   . Smokeless tobacco: Not on file  . Alcohol Use: Yes     Comment: occasional  . Drug Use: No  . Sexual Activity: Not on file   Other Topics Concern  . Not on file   Social History Narrative   Married   1 daughter went to college     Past Surgical History  Procedure Laterality Date  . Hysterectomy unspecified area    . Abdominal hysterectomy      Family History  Problem Relation Age of Onset  . Coronary artery disease      female 1st degree relative <60/ female 1st degree <50  . Stroke      female 1st degree relatvie <50  . Allergies Sister     and daughter  . Allergy (severe) Sister   . Heart attack Sister   . Asthma Daughter   . Allergy (severe) Daughter   .  Heart disease Mother   . Heart attack Mother   . Other Mother     rheumatism  . Heart disease Father   . Heart disease Paternal Grandfather   . Heart attack Paternal Grandfather   . Stroke Paternal Grandfather   . Cancer Paternal Grandfather     prostate  . Prostate cancer Maternal Grandfather   . Other Maternal Grandmother     rheumatism    Allergies  Allergen Reactions  . Sulfonamide Derivatives Swelling    Current Outpatient Prescriptions on File Prior to Visit  Medication Sig Dispense Refill  . albuterol (PROVENTIL HFA;VENTOLIN HFA) 108 (90 BASE) MCG/ACT inhaler Inhale 2 puffs into the lungs every 6 (six) hours as needed for wheezing.  1 Inhaler  0  . aspirin EC 81 MG tablet Take 81 mg by mouth daily.      . furosemide (LASIX) 40 MG tablet Take 1 tablet (40 mg total) by mouth daily.  30 tablet  3  . metFORMIN (GLUCOPHAGE-XR) 500 MG 24  hr tablet 2 tabs by mouth once daily.  60 tablet  2  . nebivolol (BYSTOLIC) 5 MG tablet Take 1 tablet (5 mg total) by mouth daily.  30 tablet  3  . potassium chloride (MICRO-K) 10 MEQ CR capsule Take 1 capsule (10 mEq total) by mouth daily.  30 capsule  2   No current facility-administered medications on file prior to visit.    BP 146/98  Pulse 71  Temp(Src) 98.1 F (36.7 C) (Oral)  Resp 18  Ht 5\' 6"  (1.676 m)  Wt 310 lb (140.615 kg)  BMI 50.06 kg/m2  SpO2 99%       Objective:   Physical Exam  Constitutional: She is oriented to person, place, and time. She appears well-developed and well-nourished. No distress.  HENT:  Head: Normocephalic and atraumatic.  Small shallow lesion noted in right nare  Cardiovascular: Normal rate and regular rhythm.   No murmur heard. Pulmonary/Chest: Effort normal and breath sounds normal. No respiratory distress. She has no wheezes. She has no rales. She exhibits no tenderness.  Musculoskeletal: She exhibits no edema.  Lymphadenopathy:    She has no cervical adenopathy.  Neurological: She is  alert and oriented to person, place, and time.  Skin:  Right posterior thigh, small lesion: dry indurated scarring at site of insect bite- no inflamation  Psychiatric: She has a normal mood and affect. Her behavior is normal. Judgment and thought content normal.          Assessment & Plan:

## 2013-04-13 NOTE — Patient Instructions (Addendum)
Call [facility] to schedule an appointment with their Optifast clinic: [facility] [phone] [address] [place] [address]  Please return for lab work at your earliest convenience. Follow up in 3 months.

## 2013-04-16 ENCOUNTER — Telehealth: Payer: Self-pay | Admitting: Family

## 2013-04-16 DIAGNOSIS — J3489 Other specified disorders of nose and nasal sinuses: Secondary | ICD-10-CM | POA: Insufficient documentation

## 2013-04-16 NOTE — Assessment & Plan Note (Signed)
Lab Results  Component Value Date   HGBA1C 6.4* 12/29/2012   A1C and BMET ordered, pt did not complete. On metformin. Will ask pt to return to lab.

## 2013-04-16 NOTE — Telephone Encounter (Signed)
Pls call pt and remind her to return for lab work ordered during her visit.

## 2013-04-16 NOTE — Assessment & Plan Note (Signed)
BP Readings from Last 3 Encounters:  04/13/13 146/98  12/29/12 136/82  12/10/12 126/81   BP up, did not take meds- reinforced compliance with meds.

## 2013-04-16 NOTE — Assessment & Plan Note (Signed)
Advised bacitracin.

## 2013-04-16 NOTE — Assessment & Plan Note (Signed)
Resume CPAP 

## 2013-04-17 MED ORDER — NEBIVOLOL HCL 5 MG PO TABS: 5.0000 mg | ORAL_TABLET | Freq: Every day | ORAL | Status: DC

## 2013-04-17 MED ORDER — ALBUTEROL SULFATE HFA 108 (90 BASE) MCG/ACT IN AERS: 2.0000 | INHALATION_SPRAY | Freq: Four times a day (QID) | RESPIRATORY_TRACT | Status: DC | PRN

## 2013-04-17 MED ORDER — FUROSEMIDE 40 MG PO TABS: 40.0000 mg | ORAL_TABLET | Freq: Every day | ORAL | Status: DC

## 2013-04-17 NOTE — Telephone Encounter (Signed)
Notified pt. She states she will return today or Monday to complete labs. States she needs refills on bystolic, furosemide and albuterol. Advised pt refills have been sent.

## 2013-10-19 ENCOUNTER — Telehealth: Payer: Self-pay

## 2013-10-19 DIAGNOSIS — E785 Hyperlipidemia, unspecified: Secondary | ICD-10-CM

## 2013-10-19 DIAGNOSIS — E119 Type 2 diabetes mellitus without complications: Secondary | ICD-10-CM

## 2013-10-19 NOTE — Telephone Encounter (Signed)
Appointment scheduled for 10/23/13

## 2013-10-19 NOTE — Telephone Encounter (Signed)
Diabetic Bundle- left a detailed message on vm and ordered labs  Also message stated that pt would need to call and schedule a BP check

## 2013-10-23 ENCOUNTER — Telehealth: Payer: Self-pay | Admitting: *Deleted

## 2013-10-23 ENCOUNTER — Ambulatory Visit: Payer: BC Managed Care – PPO | Admitting: Family

## 2013-10-23 ENCOUNTER — Telehealth: Payer: Self-pay | Admitting: Family

## 2013-10-23 DIAGNOSIS — Z0289 Encounter for other administrative examinations: Secondary | ICD-10-CM

## 2013-10-23 MED ORDER — FUROSEMIDE 40 MG PO TABS: 40.0000 mg | ORAL_TABLET | Freq: Every day | ORAL | Status: DC

## 2013-10-23 MED ORDER — NEBIVOLOL HCL 5 MG PO TABS: 5.0000 mg | ORAL_TABLET | Freq: Every day | ORAL | Status: DC

## 2013-10-23 NOTE — Telephone Encounter (Signed)
Pt NOS Appt, sister had emergency surgery. She is rescheduled to Monday

## 2013-10-23 NOTE — Telephone Encounter (Signed)
[facility]- [facility]stolic  [facility] [phone] [facility] [place]

## 2013-10-23 NOTE — Telephone Encounter (Signed)
Received call from pt requesting enough lasix to get her through to her appt on Monday. States she is unable to make appt today due to family emergency with her sister. Refill sent.

## 2013-10-23 NOTE — Telephone Encounter (Signed)
Spoke with pt this morning. She r/s to Monday 10/26/13 and is aware that only enough tabs for the weekend will be dispensed. # 4 tablets sent.

## 2013-10-26 ENCOUNTER — Telehealth: Payer: Self-pay | Admitting: Family

## 2013-10-26 ENCOUNTER — Ambulatory Visit (INDEPENDENT_AMBULATORY_CARE_PROVIDER_SITE_OTHER): Payer: BC Managed Care – PPO | Admitting: Family

## 2013-10-26 ENCOUNTER — Encounter: Payer: Self-pay | Admitting: Family

## 2013-10-26 VITALS — BP 138/100 | HR 74 | Temp 98.4°F | Resp 16 | Ht 66.0 in | Wt 312.1 lb

## 2013-10-26 DIAGNOSIS — R0609 Other forms of dyspnea: Secondary | ICD-10-CM

## 2013-10-26 DIAGNOSIS — R0989 Other specified symptoms and signs involving the circulatory and respiratory systems: Secondary | ICD-10-CM

## 2013-10-26 DIAGNOSIS — I10 Essential (primary) hypertension: Secondary | ICD-10-CM

## 2013-10-26 DIAGNOSIS — G4733 Obstructive sleep apnea (adult) (pediatric): Secondary | ICD-10-CM

## 2013-10-26 DIAGNOSIS — M25559 Pain in unspecified hip: Secondary | ICD-10-CM

## 2013-10-26 DIAGNOSIS — R102 Pelvic and perineal pain unspecified side: Secondary | ICD-10-CM

## 2013-10-26 DIAGNOSIS — E119 Type 2 diabetes mellitus without complications: Secondary | ICD-10-CM

## 2013-10-26 DIAGNOSIS — N760 Acute vaginitis: Secondary | ICD-10-CM

## 2013-10-26 DIAGNOSIS — J310 Chronic rhinitis: Secondary | ICD-10-CM | POA: Insufficient documentation

## 2013-10-26 DIAGNOSIS — J3089 Other allergic rhinitis: Secondary | ICD-10-CM

## 2013-10-26 DIAGNOSIS — N949 Unspecified condition associated with female genital organs and menstrual cycle: Secondary | ICD-10-CM

## 2013-10-26 DIAGNOSIS — E785 Hyperlipidemia, unspecified: Secondary | ICD-10-CM

## 2013-10-26 LAB — LIPID PANEL
CHOL/HDL RATIO: 5.3 ratio
CHOLESTEROL: 202 mg/dL — AB (ref 0–200)
HDL: 38 mg/dL — ABNORMAL LOW (ref >39)
LDL CALC: 141 mg/dL — AB (ref 0–99)
Triglycerides: 116 mg/dL (ref <150)
VLDL: 23 mg/dL (ref 0–40)

## 2013-10-26 LAB — HEPATIC FUNCTION PANEL
ALK PHOS: 74 U/L (ref 39–117)
ALT: 22 U/L (ref 0–35)
AST: 19 U/L (ref 0–37)
Albumin: 4.1 g/dL (ref 3.5–5.2)
BILIRUBIN DIRECT: 0.1 mg/dL (ref 0.0–0.3)
BILIRUBIN INDIRECT: 0.3 mg/dL (ref 0.2–1.2)
BILIRUBIN TOTAL: 0.4 mg/dL (ref 0.2–1.2)
Total Protein: 7.1 g/dL (ref 6.0–8.3)

## 2013-10-26 LAB — BASIC METABOLIC PANEL WITH GFR
BUN: 17 mg/dL (ref 6–23)
CALCIUM: 9.4 mg/dL (ref 8.4–10.5)
CHLORIDE: 102 meq/L (ref 96–112)
CO2: 21 mEq/L (ref 19–32)
Creat: 0.88 mg/dL (ref 0.50–1.10)
GFR, Est African American: >89 mL/min
GFR, Est Non African American: 78 mL/min
GLUCOSE: 101 mg/dL — AB (ref 70–99)
POTASSIUM: 4.3 meq/L (ref 3.5–5.3)
Sodium: 137 mEq/L (ref 135–145)

## 2013-10-26 LAB — HEMOGLOBIN A1C
Hgb A1c MFr Bld: 6.4 % — ABNORMAL HIGH (ref <5.7)
MEAN PLASMA GLUCOSE: 137 mg/dL — AB (ref <117)

## 2013-10-26 MED ORDER — POTASSIUM CHLORIDE ER 10 MEQ PO CPCR: 10.0000 meq | ORAL_CAPSULE | Freq: Every day | ORAL | Status: DC

## 2013-10-26 MED ORDER — FUROSEMIDE 40 MG PO TABS: 40.0000 mg | ORAL_TABLET | Freq: Every day | ORAL | Status: DC

## 2013-10-26 MED ORDER — NEBIVOLOL HCL 5 MG PO TABS: 5.0000 mg | ORAL_TABLET | Freq: Every day | ORAL | Status: DC

## 2013-10-26 NOTE — Progress Notes (Signed)
   Subjective:    Patient ID: [name], female    DOB: 03/19/1966, 48 y.o.   MRN: [id number]  HPI   Review of Systems     Objective:   Physical Exam        Assessment & Plan:

## 2013-10-26 NOTE — Progress Notes (Signed)
Pre visit review using our clinic review tool, if applicable. No additional management support is needed unless otherwise documented below in the visit note. 

## 2013-10-26 NOTE — Telephone Encounter (Signed)
Relevant patient education mailed to patient.  

## 2013-10-26 NOTE — Patient Instructions (Signed)
Increase lasix to twice daily for 3 days then return to one tablet once daily. Add claritin once daily for congestion. You will be contacted about your echocardiogram to check your heart. Restart blood pressure meds. Follow up in 2 weeks.

## 2013-10-26 NOTE — Assessment & Plan Note (Signed)
Will add antihistamine to see if this helps with her symptoms.

## 2013-10-26 NOTE — Assessment & Plan Note (Signed)
Uncontrolled. Urged med compliance. Resume meds follow up in 2 weeks for nurse visit bp recheck.

## 2013-10-26 NOTE — Progress Notes (Signed)
Subjective:    Patient ID: [name], female    DOB: Jan 19, 1966, 48 y.o.   MRN: [id number]  HPI  [name] is a 48 yr old female who presents today for follow up.  1) DM2-  Current meds include metformin.  Lab Results  Component Value Date   HGBA1C 6.4* 12/29/2012   HGBA1C 5.7* 11/25/2012   HGBA1C 6.1* 08/25/2012   Lab Results  Component Value Date   MICROALBUR 0.50 06/04/2012   LDLCALC 126* 12/29/2012   CREATININE 0.98 12/29/2012     2) HTN- maintained on furosemide, bystolic and kdur. Reports that she "may not have taken her meds yesterday."   BP Readings from Last 3 Encounters:  10/26/13 138/100  04/13/13 146/98  12/29/12 136/82   3) OSA-  She reports non-compliance with her CPAP.  Reports that she has sob with ambulation and with laying flat.  Report some URI symptoms.  Does have some "phlegm in my throat."  + drainage down the back of her throat.  Denies fever.   Wt Readings from Last 3 Encounters:  10/26/13 312 lb 1.3 oz (141.559 kg)  04/13/13 310 lb (140.615 kg)  12/29/12 303 lb 12 oz (137.78 kg)    4) Vaginal irritation- thinks she may have recurrent bacterial vaginosis    Review of Systems See HPI  Past Medical History  Diagnosis Date  . Palpitation     event monito 2/10  . Chest pain     negative Myoview march 2009  . HTN (hypertension)     controlled  . OSA (obstructive sleep apnea)     mild  . Obesity   . Lipoma     Hx-left leg. s/p excision  . Rectal bleeding 5/10    normal colonoscopy. Dr. Arlyce DiceKaplan  . Yeast infection     History   Social History  . Marital Status: Married    Spouse Name: Iantha FallenKenneth    Number of Children: 3  . Years of Education: N/A   Occupational History  . ADMIN     [employer]   Social History Main Topics  . Smoking status: Never Smoker   . Smokeless tobacco: Not on file  . Alcohol Use: Yes     Comment: occasional  . Drug Use: No  . Sexual Activity: Not on file   Other Topics Concern  . Not  on file   Social History Narrative   Married   1 daughter went to college     Past Surgical History  Procedure Laterality Date  . Hysterectomy unspecified area    . Abdominal hysterectomy      Family History  Problem Relation Age of Onset  . Coronary artery disease      female 1st degree relative <60/ female 1st degree <50  . Stroke      female 1st degree relatvie <50  . Allergies Sister     and daughter  . Allergy (severe) Sister   . Heart attack Sister   . Asthma Daughter   . Allergy (severe) Daughter   . Heart disease Mother   . Heart attack Mother   . Other Mother     rheumatism  . Heart disease Father   . Heart disease Paternal Grandfather   . Heart attack Paternal Grandfather   . Stroke Paternal Grandfather   . Cancer Paternal Grandfather     prostate  . Prostate cancer Maternal Grandfather   . Other Maternal Grandmother     rheumatism  Allergies  Allergen Reactions  . Sulfonamide Derivatives Swelling    Current Outpatient Prescriptions on File Prior to Visit  Medication Sig Dispense Refill  . albuterol (PROVENTIL HFA;VENTOLIN HFA) 108 (90 BASE) MCG/ACT inhaler Inhale 2 puffs into the lungs every 6 (six) hours as needed for wheezing.  1 Inhaler  3  . aspirin EC 81 MG tablet Take 81 mg by mouth daily.      . metFORMIN (GLUCOPHAGE-XR) 500 MG 24 hr tablet 2 tabs by mouth once daily.  60 tablet  2  . omeprazole (PRILOSEC) 20 MG capsule Take 20 mg by mouth daily as needed.       No current facility-administered medications on file prior to visit.    BP 138/100  Pulse 74  Temp(Src) 98.4 F (36.9 C) (Oral)  Resp 16  Ht 5\' 6"  (1.676 m)  Wt 312 lb 1.3 oz (141.559 kg)  BMI 50.40 kg/m2  SpO2 99%       Objective:   Physical Exam  Constitutional: She is oriented to person, place, and time. She appears well-developed and well-nourished. No distress.  HENT:  Head: Normocephalic and atraumatic.  Cardiovascular: Normal rate and regular rhythm.   No murmur  heard. Pulmonary/Chest: Effort normal and breath sounds normal. No respiratory distress. She has no wheezes. She has no rales. She exhibits no tenderness.  Genitourinary: Vagina normal. No erythema around the vagina. No vaginal discharge found.  Musculoskeletal:  1+ bilateral LE edema  Neurological: She is alert and oriented to person, place, and time.  Psychiatric: She has a normal mood and affect. Her behavior is normal. Judgment and thought content normal.          Assessment & Plan:

## 2013-10-26 NOTE — Assessment & Plan Note (Addendum)
Non-compliant with CPAP and Likely cause for LE edema, worsening DOE. Reinforced importance of CPAP compliance.  Will also obtain 2D echo to evaluate cardiac function. Increase lasix to bid for 3 days to see if this helps symptoms.

## 2013-10-26 NOTE — Assessment & Plan Note (Signed)
Stable on metformin. Obtain bmet/a1c

## 2013-10-26 NOTE — Assessment & Plan Note (Signed)
Wet prep obtained. Will plan to treat based on results.

## 2013-10-28 ENCOUNTER — Telehealth: Payer: Self-pay | Admitting: Family

## 2013-10-28 LAB — WET PREP BY MOLECULAR PROBE
Candida species: POSITIVE — AB
GARDNERELLA VAGINALIS: NEGATIVE
Trichomonas vaginosis: NEGATIVE

## 2013-10-28 MED ORDER — FLUCONAZOLE 150 MG PO TABS: ORAL_TABLET | ORAL | Status: DC

## 2013-10-28 NOTE — Telephone Encounter (Signed)
Also, wet prep shows yeast. Start diflucan.

## 2013-10-28 NOTE — Telephone Encounter (Signed)
Please let pt know that sugar is at goal.  LDL is above goal.  I would like to add a statin, but need to know if she is using regular birth control.  She should not become pregnant while using statin.  If so, start atorvastatin 20mg  once daily, repeat flp/lft in 6 weeks.

## 2013-10-28 NOTE — Telephone Encounter (Signed)
Pt was contacted Msg was given.Pt voiced understanding of this. Pt was also advised of the ABX sent over to her local Pharm.Pt states she would like to have NP go into more detail about Lipitor befor being placed on it.

## 2013-11-04 ENCOUNTER — Ambulatory Visit (HOSPITAL_BASED_OUTPATIENT_CLINIC_OR_DEPARTMENT_OTHER)
Admission: RE | Admit: 2013-11-04 | Discharge: 2013-11-04 | Disposition: A | Discharge status: Home / Self Care | Payer: BC Managed Care – PPO | Source: Ambulatory Visit | Attending: Family | Admitting: Family

## 2013-11-04 DIAGNOSIS — G4733 Obstructive sleep apnea (adult) (pediatric): Secondary | ICD-10-CM | POA: Insufficient documentation

## 2013-11-04 DIAGNOSIS — R0789 Other chest pain: Secondary | ICD-10-CM

## 2013-11-04 DIAGNOSIS — I059 Rheumatic mitral valve disease, unspecified: Secondary | ICD-10-CM

## 2013-11-04 DIAGNOSIS — R0609 Other forms of dyspnea: Secondary | ICD-10-CM

## 2013-11-04 DIAGNOSIS — R0989 Other specified symptoms and signs involving the circulatory and respiratory systems: Principal | ICD-10-CM | POA: Insufficient documentation

## 2013-11-04 DIAGNOSIS — E785 Hyperlipidemia, unspecified: Secondary | ICD-10-CM

## 2013-11-04 NOTE — Progress Notes (Signed)
Echocardiogram 2D Echocardiogram has been performed.  Dorothey BasemanReel, Shakeela Rabadan M 11/04/2013, 11:15 AM

## 2013-11-09 ENCOUNTER — Ambulatory Visit (HOSPITAL_BASED_OUTPATIENT_CLINIC_OR_DEPARTMENT_OTHER): Payer: BC Managed Care – PPO

## 2013-11-09 ENCOUNTER — Encounter: Payer: Self-pay | Admitting: Family

## 2013-11-09 ENCOUNTER — Other Ambulatory Visit (HOSPITAL_BASED_OUTPATIENT_CLINIC_OR_DEPARTMENT_OTHER): Payer: BC Managed Care – PPO

## 2013-11-09 ENCOUNTER — Ambulatory Visit (HOSPITAL_BASED_OUTPATIENT_CLINIC_OR_DEPARTMENT_OTHER)
Admission: RE | Admit: 2013-11-09 | Discharge: 2013-11-09 | Disposition: A | Discharge status: Home / Self Care | Payer: BC Managed Care – PPO | Source: Ambulatory Visit | Attending: Family | Admitting: Family

## 2013-11-09 ENCOUNTER — Telehealth: Payer: Self-pay | Admitting: Family

## 2013-11-09 ENCOUNTER — Ambulatory Visit (INDEPENDENT_AMBULATORY_CARE_PROVIDER_SITE_OTHER): Payer: BC Managed Care – PPO | Admitting: Family

## 2013-11-09 VITALS — BP 134/80 | HR 76 | Temp 98.0°F | Resp 15 | Ht 66.0 in | Wt 312.1 lb

## 2013-11-09 DIAGNOSIS — R1319 Other dysphagia: Secondary | ICD-10-CM

## 2013-11-09 DIAGNOSIS — R102 Pelvic and perineal pain: Secondary | ICD-10-CM

## 2013-11-09 DIAGNOSIS — I10 Essential (primary) hypertension: Secondary | ICD-10-CM

## 2013-11-09 DIAGNOSIS — E041 Nontoxic single thyroid nodule: Secondary | ICD-10-CM

## 2013-11-09 DIAGNOSIS — N949 Unspecified condition associated with female genital organs and menstrual cycle: Secondary | ICD-10-CM | POA: Insufficient documentation

## 2013-11-09 MED ORDER — FUROSEMIDE 40 MG PO TABS: ORAL_TABLET | ORAL | Status: DC

## 2013-11-09 MED ORDER — NEBIVOLOL HCL 5 MG PO TABS: 5.0000 mg | ORAL_TABLET | Freq: Every day | ORAL | Status: DC

## 2013-11-09 MED ORDER — POTASSIUM CHLORIDE ER 10 MEQ PO CPCR: 10.0000 meq | ORAL_CAPSULE | Freq: Every day | ORAL | Status: DC

## 2013-11-09 NOTE — Patient Instructions (Addendum)
Work on [employer]healthy diet, exercise. Schedule neck ultrasound on the first floor. Follow up in 3 months.

## 2013-11-09 NOTE — Assessment & Plan Note (Signed)
Will start with thyroid ultrasound to evaluate for nodule/thyromegally. Consider GI work up if normal US and symptoms persist.

## 2013-11-09 NOTE — Assessment & Plan Note (Signed)
Improved now that she is back on her meds. Reinforced importance of continued compliance.

## 2013-11-09 NOTE — Assessment & Plan Note (Signed)
Pt was given handout on 03-1499 kcal ADA diet and we discussed potion measurement/monitoring.  Advised pt on regular exercise as well.

## 2013-11-09 NOTE — Telephone Encounter (Signed)
No ovarian abnormalities noted on pelvic ultrasound.  She does not have fibroids because she does not have a uterus.

## 2013-11-09 NOTE — Progress Notes (Signed)
Subjective:    Patient ID: [name], female    DOB: April 28, 1965, 48 y.o.   MRN: [id number]  HPI  [name] is a 48 yr old female who presents today for follow up of her blood pressure. Last visit BP was noted to be elevated in the setting of med non-compliance. She was urged to resume meds and follow up in 2 weeks for BP recheck.    BP Readings from Last 3 Encounters:  11/09/13 134/80  10/26/13 138/100  04/13/13 146/98   Reports some dysphagia," feel like my thyroid is swollen."   Review of Systems    see HPI  Past Medical History  Diagnosis Date  . Palpitation     event monito 2/10  . Chest pain     negative Myoview march 2009  . HTN (hypertension)     controlled  . OSA (obstructive sleep apnea)     mild  . Obesity   . Lipoma     Hx-left leg. s/p excision  . Rectal bleeding 5/10    normal colonoscopy. Dr. Arlyce Dice  . Yeast infection     History   Social History  . Marital Status: Married    Spouse Name: Iantha Fallen    Number of Children: 3  . Years of Education: N/A   Occupational History  . ADMIN     [employer]   Social History Main Topics  . Smoking status: Never Smoker   . Smokeless tobacco: Not on file  . Alcohol Use: Yes     Comment: occasional  . Drug Use: No  . Sexual Activity: Not on file   Other Topics Concern  . Not on file   Social History Narrative   Married   1 daughter went to college     Past Surgical History  Procedure Laterality Date  . Hysterectomy unspecified area    . Abdominal hysterectomy      Family History  Problem Relation Age of Onset  . Coronary artery disease      female 1st degree relative <60/ female 1st degree <50  . Stroke      female 1st degree relatvie <50  . Allergies Sister     and daughter  . Allergy (severe) Sister   . Heart attack Sister   . Asthma Daughter   . Allergy (severe) Daughter   . Heart disease Mother   . Heart attack Mother   . Other Mother     rheumatism  . Heart disease  Father   . Heart disease Paternal Grandfather   . Heart attack Paternal Grandfather   . Stroke Paternal Grandfather   . Cancer Paternal Grandfather     prostate  . Prostate cancer Maternal Grandfather   . Other Maternal Grandmother     rheumatism    Allergies  Allergen Reactions  . Sulfonamide Derivatives Swelling    Current Outpatient Prescriptions on File Prior to Visit  Medication Sig Dispense Refill  . albuterol (PROVENTIL HFA;VENTOLIN HFA) 108 (90 BASE) MCG/ACT inhaler Inhale 2 puffs into the lungs every 6 (six) hours as needed for wheezing.  1 Inhaler  3  . aspirin EC 81 MG tablet Take 81 mg by mouth daily.      . fluconazole (DIFLUCAN) 150 MG tablet One tab by mouth today for yeast infection, may repeat in 3 days if symptoms not resolved.  2 tablet  0  . metFORMIN (GLUCOPHAGE-XR) 500 MG 24 hr tablet 2 tabs by mouth once daily.  60 tablet  2  . omeprazole (PRILOSEC) 20 MG capsule Take 20 mg by mouth daily as needed.       No current facility-administered medications on file prior to visit.    BP 134/80  Pulse 76  Temp(Src) 98 F (36.7 C) (Oral)  Resp 15  Ht 5\' 6"  (1.676 m)  Wt 312 lb 1.9 oz (141.577 kg)  BMI 50.40 kg/m2  SpO2 98%    Objective:   Physical Exam  Constitutional: She is oriented to person, place, and time. She appears well-developed and well-nourished. No distress.  HENT:  Head: Normocephalic and atraumatic.  Neck:  No obvious thyroid enlargement but pt has thick layer of adipose on neck.    Cardiovascular: Normal rate and regular rhythm.   No murmur heard. Pulmonary/Chest: Effort normal and breath sounds normal. No respiratory distress. She has no wheezes. She has no rales. She exhibits no tenderness.  Neurological: She is alert and oriented to person, place, and time.  Psychiatric: She has a normal mood and affect. Her behavior is normal. Judgment and thought content normal.          Assessment & Plan:

## 2013-11-09 NOTE — Progress Notes (Signed)
Pre visit review using our clinic review tool, if applicable. No additional management support is needed unless otherwise documented below in the visit note. 

## 2013-11-09 NOTE — Telephone Encounter (Addendum)
Please advise her that her thyroid US shows small nodule on thyroid.  I don't think this is large enough to cause her symptoms, but I would like her to repeat her thyroid US in 6 mos.  Also please ask her to return for Thyroid function blood tests.

## 2013-11-10 NOTE — Telephone Encounter (Signed)
Left detailed message on voicemail and to call if any questions. Lab orders signed.

## 2013-11-13 ENCOUNTER — Telehealth: Payer: Self-pay | Admitting: *Deleted

## 2013-11-13 MED ORDER — FUROSEMIDE 40 MG PO TABS: ORAL_TABLET | ORAL | Status: DC

## 2013-11-13 NOTE — Telephone Encounter (Signed)
Received call from pt stating she was advised to increase Lasix to twice a day and she is requesting 90 day supply. Per verbal from Provider, pt was told to continue once a day dosing and only take additional dose if needed. Will send 45/month or 135 for 90 days.  Pt requests brand name only as she doesn't feel generic works as well. Rx faxed and pt notified.

## 2013-11-15 ENCOUNTER — Telehealth: Payer: Self-pay | Admitting: Family

## 2013-11-15 NOTE — Telephone Encounter (Signed)
Please remind pt to complete pending labs.

## 2013-11-16 NOTE — Telephone Encounter (Signed)
I spoke with pt on Friday and she will return to the lab this week.

## 2013-11-17 ENCOUNTER — Encounter: Payer: Self-pay | Admitting: Family

## 2013-11-17 LAB — T4, FREE: FREE T4: 1.27 ng/dL (ref 0.80–1.80)

## 2013-11-17 LAB — TSH: TSH: 1.927 u[IU]/mL (ref 0.350–4.500)

## 2013-11-17 LAB — T3, FREE: T3, Free: 3.3 pg/mL (ref 2.3–4.2)

## 2014-01-27 ENCOUNTER — Ambulatory Visit: Payer: BC Managed Care – PPO | Admitting: Family

## 2014-01-27 ENCOUNTER — Ambulatory Visit (INDEPENDENT_AMBULATORY_CARE_PROVIDER_SITE_OTHER): Payer: BC Managed Care – PPO | Admitting: Family

## 2014-01-27 ENCOUNTER — Encounter: Payer: Self-pay | Admitting: Family

## 2014-01-27 VITALS — BP 156/85 | HR 75 | Temp 98.0°F | Resp 18 | Ht 64.0 in | Wt 301.0 lb

## 2014-01-27 DIAGNOSIS — M25552 Pain in left hip: Secondary | ICD-10-CM

## 2014-01-27 DIAGNOSIS — H269 Unspecified cataract: Secondary | ICD-10-CM

## 2014-01-27 DIAGNOSIS — I10 Essential (primary) hypertension: Secondary | ICD-10-CM

## 2014-01-27 DIAGNOSIS — J069 Acute upper respiratory infection, unspecified: Secondary | ICD-10-CM

## 2014-01-27 DIAGNOSIS — N898 Other specified noninflammatory disorders of vagina: Secondary | ICD-10-CM

## 2014-01-27 DIAGNOSIS — N760 Acute vaginitis: Secondary | ICD-10-CM | POA: Insufficient documentation

## 2014-01-27 DIAGNOSIS — J029 Acute pharyngitis, unspecified: Secondary | ICD-10-CM

## 2014-01-27 DIAGNOSIS — M25559 Pain in unspecified hip: Secondary | ICD-10-CM | POA: Insufficient documentation

## 2014-01-27 DIAGNOSIS — Z0289 Encounter for other administrative examinations: Secondary | ICD-10-CM

## 2014-01-27 LAB — POCT RAPID STREP A (OFFICE): Rapid Strep A Screen: NEGATIVE

## 2014-01-27 MED ORDER — MELOXICAM 7.5 MG PO TABS: 7.5000 mg | ORAL_TABLET | Freq: Every day | ORAL | Status: DC

## 2014-01-27 NOTE — Assessment & Plan Note (Signed)
Trial of meloxicam, sports med referral if no improvement.

## 2014-01-27 NOTE — Progress Notes (Signed)
Subjective:    Patient ID: [name], female    DOB: 07/06/1965, 48 y.o.   MRN: [id number]  HPI  [name] is a 48 year old female who presents today for follow up.  1. Fever/headache - Fever started Sunday and was greater than 100.0, she has had a fever every day since Sunday.  Endorses cough since last Friday, runny nose, headache and throat swelling and throat pain.  States that symptoms have gotten worse since Sunday.  Taken advil and a decongestant, with minimal relief.  She feels achy in her joints and has not been able to get out of bed until today.  She reports mild chest pain most recently this morning that does not worsen when she coughs.   She has noticed her voice becoming hoarse.  She also reports neck and shoulder stiffness.  Her hands have felt cold and numb.    2. Vaginal discharge - Reports that this started one month ago and has persisted.  She was recently treated for a yeast infection with diflucan and symptoms were relieved, however symptoms have now returned.  She has discharge with  a foul odor and is clear to yellow color.  Has not taken anything over the counter for it.  She reports dark urine for one month despite drinking water throughout the day.  3. Hypertension - Takes bystolic currently with high compliance.  She has not taken her medicine today.  She takes her blood pressure at home and systolic readings have been in the 130-140 range.  She reports that she is short of breath often.  This started about one month ago. She notices this especially when she climbs stairs, and it is relieved with inactivity.            Review of Systems  Constitutional: Positive for fever, chills, activity change and fatigue.  HENT: Positive for congestion, postnasal drip, rhinorrhea and sore throat. Negative for ear discharge and ear pain.   Eyes: Negative.   Respiratory: Positive for cough and shortness of breath. Negative for wheezing.   Cardiovascular: Positive for  chest pain and leg swelling. Negative for palpitations.       Pt. Had episode of chest wall pain and breast tenderness this am that resolved spontaneously.  Gastrointestinal: Negative.   Endocrine: Negative.   Genitourinary: Positive for vaginal discharge. Negative for urgency, frequency and hematuria.  Musculoskeletal: Negative.        Reports aching pain in left hip  Skin: Negative for pallor and rash.  Neurological: Positive for headaches. Negative for syncope, speech difficulty and light-headedness.   Past Medical History  Diagnosis Date  . Palpitation     event monito 2/10  . Chest pain     negative Myoview march 2009  . HTN (hypertension)     controlled  . OSA (obstructive sleep apnea)     mild  . Obesity   . Lipoma     Hx-left leg. s/p excision  . Rectal bleeding 5/10    normal colonoscopy. Dr. Arlyce Dice  . Yeast infection     History   Social History  . Marital Status: Married    Spouse Name: Iantha Fallen    Number of Children: 3  . Years of Education: N/A   Occupational History  . ADMIN     [employer]   Social History Main Topics  . Smoking status: Never Smoker   . Smokeless tobacco: Not on file  . Alcohol Use: Yes  Comment: occasional  . Drug Use: No  . Sexual Activity: Not on file   Other Topics Concern  . Not on file   Social History Narrative   Married   1 daughter went to college     Past Surgical History  Procedure Laterality Date  . Hysterectomy unspecified area    . Abdominal hysterectomy      Family History  Problem Relation Age of Onset  . Coronary artery disease      female 1st degree relative <60/ female 1st degree <50  . Stroke      female 1st degree relatvie <50  . Allergies Sister     and daughter  . Allergy (severe) Sister   . Heart attack Sister   . Asthma Daughter   . Allergy (severe) Daughter   . Heart disease Mother   . Heart attack Mother   . Other Mother     rheumatism  . Heart disease Father   . Heart  disease Paternal Grandfather   . Heart attack Paternal Grandfather   . Stroke Paternal Grandfather   . Cancer Paternal Grandfather     prostate  . Prostate cancer Maternal Grandfather   . Other Maternal Grandmother     rheumatism    Allergies  Allergen Reactions  . Sulfonamide Derivatives Swelling    Current Outpatient Prescriptions on File Prior to Visit  Medication Sig Dispense Refill  . albuterol (PROVENTIL HFA;VENTOLIN HFA) 108 (90 BASE) MCG/ACT inhaler Inhale 2 puffs into the lungs every 6 (six) hours as needed for wheezing.  1 Inhaler  3  . aspirin EC 81 MG tablet Take 81 mg by mouth daily.      . fluconazole (DIFLUCAN) 150 MG tablet One tab by mouth today for yeast infection, may repeat in 3 days if symptoms not resolved.  2 tablet  0  . furosemide (LASIX) 40 MG tablet Take one tablet once daily every day.  OK to take afternoon dose if increased swelling  135 tablet  0  . metFORMIN (GLUCOPHAGE-XR) 500 MG 24 hr tablet 2 tabs by mouth once daily.  60 tablet  2  . nebivolol (BYSTOLIC) 5 MG tablet Take 1 tablet (5 mg total) by mouth daily.  90 tablet  0  . omeprazole (PRILOSEC) 20 MG capsule Take 20 mg by mouth daily as needed.      . potassium chloride (MICRO-K) 10 MEQ CR capsule Take 1 capsule (10 mEq total) by mouth daily.  90 capsule  0   No current facility-administered medications on file prior to visit.    BP 156/85  Pulse 75  Temp(Src) 98 F (36.7 C) (Oral)  Resp 18  Ht 5\' 4"  (1.626 m)  Wt 301 lb (136.533 kg)  BMI 51.64 kg/m2  SpO2 99%       Objective:   Physical Exam  Constitutional: She is oriented to person, place, and time. She appears well-developed and well-nourished.  HENT:  Head: Normocephalic and atraumatic.  Eyes: Pupils are equal, round, and reactive to light.  Neck: Normal range of motion. Neck supple. No JVD present.  Cardiovascular: Normal rate, regular rhythm, normal heart sounds and intact distal pulses.   No murmur heard. Pulmonary/Chest:  Effort normal and breath sounds normal. No respiratory distress. She has no wheezes. She has no rales.  Abdominal: Soft.  Musculoskeletal: Normal range of motion.  No nuchal rigidity.   L hip pain with adduction  Neurological: She is alert and oriented to person, place, and time.  Skin: Skin is warm and dry.  Psychiatric: She has a normal mood and affect.          Assessment & Plan:  Will do rapid strep, wet prep, GCS/chlaymdia. Will Rx for meloxicam for hip pain.    I have personally seen and examined patient and agree with Suzzette RighterErin Smith NP student's assessment and plan. Sandford CrazeMelissa O'Sullivan NP

## 2014-01-27 NOTE — Assessment & Plan Note (Signed)
Stable on meds.  Continue same.

## 2014-01-27 NOTE — Assessment & Plan Note (Addendum)
Rapid strep is negative. Symptoms most consistent with viral URI.  Advised pt to call if symptoms worsen or if symptoms do not improve in next few days.

## 2014-01-27 NOTE — Patient Instructions (Signed)
Call if you develop fever >101, or if not feeling better in 3 days. Start meloxicam once daily for hip pain. We will contact you with the results of your lab tests.

## 2014-01-27 NOTE — Assessment & Plan Note (Signed)
Wet prep and GC/Chlamydia swabs sent, await results prior to treatment.

## 2014-01-28 ENCOUNTER — Telehealth: Payer: Self-pay | Admitting: Family

## 2014-01-28 LAB — GC/CHLAMYDIA PROBE AMP
CT Probe RNA: NEGATIVE
GC Probe RNA: NEGATIVE

## 2014-01-28 NOTE — Telephone Encounter (Signed)
Please advise 

## 2014-01-28 NOTE — Telephone Encounter (Signed)
Order should be Wet prep not KOH please.

## 2014-01-28 NOTE — Telephone Encounter (Signed)
Caller name: Vikki PortsValerie  Relation to pt: [facility]  Call back number: [phone] Pharmacy:  Reason for call: Pt has an order for KOH and  [facility] Port[facility]Valerie [facility] needs verification for test order of that specimen. Please advice.

## 2014-01-29 ENCOUNTER — Telehealth: Payer: Self-pay | Admitting: Family

## 2014-01-29 MED ORDER — POTASSIUM CHLORIDE ER 10 MEQ PO CPCR: 10.0000 meq | ORAL_CAPSULE | Freq: Every day | ORAL | Status: DC

## 2014-01-29 MED ORDER — NEBIVOLOL HCL 5 MG PO TABS: 5.0000 mg | ORAL_TABLET | Freq: Every day | ORAL | Status: DC

## 2014-01-29 MED ORDER — FUROSEMIDE 40 MG PO TABS: ORAL_TABLET | ORAL | Status: DC

## 2014-01-29 NOTE — Telephone Encounter (Signed)
Rx printed and was faxed to pharmacy. 

## 2014-01-29 NOTE — Telephone Encounter (Signed)
Caller name: Olegario MessierKathy Relation to pt: self Call back number: [phone] Pharmacy: [facility] on [address]  Reason for call:   Patient wants bystolic(name brand) 90 day supply and (lasix)name brand 90 day supply. Patient states that potassium needs to be 90 days

## 2014-01-29 NOTE — Telephone Encounter (Signed)
Notified Presenter, broadcastingValerie at [facility]

## 2014-01-29 NOTE — Telephone Encounter (Signed)
Rxs sent per request below. Notified pt and she voices understanding.

## 2014-01-30 ENCOUNTER — Telehealth: Payer: Self-pay | Admitting: Family

## 2014-01-30 LAB — WET PREP BY MOLECULAR PROBE
Candida species: NEGATIVE
GARDNERELLA VAGINALIS: POSITIVE — AB
Trichomonas vaginosis: NEGATIVE

## 2014-01-30 MED ORDER — METRONIDAZOLE 500 MG PO TABS: 500.0000 mg | ORAL_TABLET | Freq: Two times a day (BID) | ORAL | Status: DC

## 2014-01-30 NOTE — Telephone Encounter (Signed)
Wet prep shows bv, start metronidazole, avoid etoh while taking.gc/chlamydia neg.

## 2014-02-01 NOTE — Telephone Encounter (Signed)
Attempted to reach pt and received message that # is incorrect. Will try again later.

## 2014-02-01 NOTE — Telephone Encounter (Signed)
Unable to reach pt and message states that # is incorrect. Mailed letter to pt.

## 2014-02-15 ENCOUNTER — Ambulatory Visit: Payer: BC Managed Care – PPO | Admitting: Family

## 2014-02-16 ENCOUNTER — Ambulatory Visit: Payer: BC Managed Care – PPO | Admitting: Family

## 2014-02-18 ENCOUNTER — Encounter: Payer: Self-pay | Admitting: Family

## 2014-02-26 ENCOUNTER — Telehealth: Payer: Self-pay

## 2014-02-26 NOTE — Telephone Encounter (Signed)
Dismissal Letter sent by Certified Mail 02/26/2014  Received the Return Receipt showing someone picked up Dismissal Letter 03/03/2014

## 2014-04-13 ENCOUNTER — Telehealth: Payer: Self-pay | Admitting: Family

## 2014-04-13 NOTE — Telephone Encounter (Signed)
Error/gd °

## 2014-05-10 ENCOUNTER — Emergency Department (INDEPENDENT_AMBULATORY_CARE_PROVIDER_SITE_OTHER)
Admission: EM | Admit: 2014-05-10 | Discharge: 2014-05-10 | Disposition: A | Discharge status: Other Acute Inpatient Hospital | Payer: BLUE CROSS/BLUE SHIELD | Source: Home / Self Care | Attending: Family Medicine | Admitting: Family Medicine

## 2014-05-10 ENCOUNTER — Emergency Department (HOSPITAL_COMMUNITY): Payer: BLUE CROSS/BLUE SHIELD

## 2014-05-10 ENCOUNTER — Other Ambulatory Visit: Payer: Self-pay

## 2014-05-10 ENCOUNTER — Emergency Department (HOSPITAL_COMMUNITY)
Admission: EM | Admit: 2014-05-10 | Discharge: 2014-05-11 | Disposition: A | Discharge status: Home / Self Care | Payer: BLUE CROSS/BLUE SHIELD | Attending: Emergency Medicine | Admitting: Emergency Medicine

## 2014-05-10 ENCOUNTER — Encounter (HOSPITAL_COMMUNITY): Payer: Self-pay

## 2014-05-10 ENCOUNTER — Encounter (HOSPITAL_COMMUNITY): Payer: Self-pay | Admitting: Adult Health

## 2014-05-10 DIAGNOSIS — R0789 Other chest pain: Secondary | ICD-10-CM

## 2014-05-10 DIAGNOSIS — I10 Essential (primary) hypertension: Secondary | ICD-10-CM | POA: Insufficient documentation

## 2014-05-10 DIAGNOSIS — J209 Acute bronchitis, unspecified: Secondary | ICD-10-CM | POA: Diagnosis not present

## 2014-05-10 DIAGNOSIS — Z8619 Personal history of other infectious and parasitic diseases: Secondary | ICD-10-CM | POA: Insufficient documentation

## 2014-05-10 DIAGNOSIS — N760 Acute vaginitis: Secondary | ICD-10-CM | POA: Insufficient documentation

## 2014-05-10 DIAGNOSIS — Z8719 Personal history of other diseases of the digestive system: Secondary | ICD-10-CM | POA: Diagnosis not present

## 2014-05-10 DIAGNOSIS — Z7982 Long term (current) use of aspirin: Secondary | ICD-10-CM | POA: Insufficient documentation

## 2014-05-10 DIAGNOSIS — Z8669 Personal history of other diseases of the nervous system and sense organs: Secondary | ICD-10-CM | POA: Diagnosis not present

## 2014-05-10 DIAGNOSIS — Z791 Long term (current) use of non-steroidal anti-inflammatories (NSAID): Secondary | ICD-10-CM | POA: Insufficient documentation

## 2014-05-10 DIAGNOSIS — B9689 Other specified bacterial agents as the cause of diseases classified elsewhere: Secondary | ICD-10-CM

## 2014-05-10 DIAGNOSIS — Z86018 Personal history of other benign neoplasm: Secondary | ICD-10-CM | POA: Diagnosis not present

## 2014-05-10 DIAGNOSIS — Z3202 Encounter for pregnancy test, result negative: Secondary | ICD-10-CM | POA: Diagnosis not present

## 2014-05-10 DIAGNOSIS — Z7952 Long term (current) use of systemic steroids: Secondary | ICD-10-CM | POA: Insufficient documentation

## 2014-05-10 DIAGNOSIS — E669 Obesity, unspecified: Secondary | ICD-10-CM | POA: Insufficient documentation

## 2014-05-10 DIAGNOSIS — J40 Bronchitis, not specified as acute or chronic: Secondary | ICD-10-CM

## 2014-05-10 DIAGNOSIS — N898 Other specified noninflammatory disorders of vagina: Secondary | ICD-10-CM | POA: Diagnosis present

## 2014-05-10 LAB — CBC
HCT: 40.9 % (ref 36.0–46.0)
Hemoglobin: 13.4 g/dL (ref 12.0–15.0)
MCH: 27.4 pg (ref 26.0–34.0)
MCHC: 32.8 g/dL (ref 30.0–36.0)
MCV: 83.6 fL (ref 78.0–100.0)
Platelets: 314 10*3/uL (ref 150–400)
RBC: 4.89 MIL/uL (ref 3.87–5.11)
RDW: 13.4 % (ref 11.5–15.5)
WBC: 10.6 10*3/uL — ABNORMAL HIGH (ref 4.0–10.5)

## 2014-05-10 LAB — BRAIN NATRIURETIC PEPTIDE: B Natriuretic Peptide: 29.1 pg/mL (ref 0.0–100.0)

## 2014-05-10 LAB — BASIC METABOLIC PANEL
Anion gap: 7 (ref 5–15)
BUN: 9 mg/dL (ref 6–23)
CHLORIDE: 100 mmol/L (ref 96–112)
CO2: 28 mmol/L (ref 19–32)
Calcium: 9.2 mg/dL (ref 8.4–10.5)
Creatinine, Ser: 0.87 mg/dL (ref 0.50–1.10)
GFR calc Af Amer: 89 mL/min — ABNORMAL LOW (ref >90)
GFR, EST NON AFRICAN AMERICAN: 77 mL/min — AB (ref >90)
Glucose, Bld: 102 mg/dL — ABNORMAL HIGH (ref 70–99)
POTASSIUM: 3.7 mmol/L (ref 3.5–5.1)
Sodium: 135 mmol/L (ref 135–145)

## 2014-05-10 LAB — I-STAT TROPONIN, ED: Troponin i, poc: 0 ng/mL (ref 0.00–0.08)

## 2014-05-10 LAB — WET PREP, GENITAL
Trich, Wet Prep: NONE SEEN
YEAST WET PREP: NONE SEEN

## 2014-05-10 LAB — POC URINE PREG, ED: Preg Test, Ur: NEGATIVE

## 2014-05-10 LAB — D-DIMER, QUANTITATIVE: D-Dimer, Quant: 0.33 ug/mL-FEU (ref 0.00–0.48)

## 2014-05-10 MED ORDER — PREDNISONE 20 MG PO TABS
40.0000 mg | ORAL_TABLET | Freq: Once | ORAL | Status: AC
  Administered 2014-05-10: 40 mg via ORAL
  Filled 2014-05-10: qty 2

## 2014-05-10 MED ORDER — PREDNISONE 20 MG PO TABS: 40.0000 mg | ORAL_TABLET | Freq: Every day | ORAL | Status: DC

## 2014-05-10 MED ORDER — METRONIDAZOLE 500 MG PO TABS
500.0000 mg | ORAL_TABLET | Freq: Once | ORAL | Status: AC
  Administered 2014-05-10: 500 mg via ORAL
  Filled 2014-05-10: qty 1

## 2014-05-10 MED ORDER — BENZONATATE 100 MG PO CAPS: 100.0000 mg | ORAL_CAPSULE | Freq: Three times a day (TID) | ORAL | Status: DC

## 2014-05-10 MED ORDER — ALBUTEROL SULFATE (2.5 MG/3ML) 0.083% IN NEBU
5.0000 mg | INHALATION_SOLUTION | Freq: Once | RESPIRATORY_TRACT | Status: AC
  Administered 2014-05-10: 5 mg via RESPIRATORY_TRACT
  Filled 2014-05-10: qty 6

## 2014-05-10 MED ORDER — ALBUTEROL SULFATE HFA 108 (90 BASE) MCG/ACT IN AERS
2.0000 | INHALATION_SPRAY | Freq: Once | RESPIRATORY_TRACT | Status: AC
  Administered 2014-05-10: 2 via RESPIRATORY_TRACT
  Filled 2014-05-10: qty 6.7

## 2014-05-10 MED ORDER — METRONIDAZOLE 500 MG PO TABS: 500.0000 mg | ORAL_TABLET | Freq: Two times a day (BID) | ORAL | Status: DC

## 2014-05-10 NOTE — ED Notes (Signed)
Pt. Refused wheelchair and left with all belongings 

## 2014-05-10 NOTE — ED Provider Notes (Addendum)
CSN: [id number]     Arrival date & time 05/10/14  1646 History   First MD Initiated Contact with Patient 05/10/14 1708     Chief Complaint  Patient presents with  . Chest Pain   (Consider location/radiation/quality/duration/timing/severity/associated sxs/prior Treatment) HPI      49 year old female presents complaining of chest pain for a week. The pain is intermittent, worse with any exertion. The pain radiates to her back. It is associated with severe shortness of breath and nausea. She has never had pain like this before. She also has some nasal congestion. Also she has vaginal discharge for 6 months. This is associated with lower abdominal pain. Also she complains of redness in her stomach and hands, she says her body's overtaken with infection. No fever  Past Medical History  Diagnosis Date  . Palpitation     event monito 2/10  . Chest pain     negative Myoview march 2009  . HTN (hypertension)     controlled  . OSA (obstructive sleep apnea)     mild  . Obesity   . Lipoma     Hx-left leg. s/p excision  . Rectal bleeding 5/10    normal colonoscopy. Dr. Arlyce DiceKaplan  . Yeast infection    Past Surgical History  Procedure Laterality Date  . Hysterectomy unspecified area    . Abdominal hysterectomy     Family History  Problem Relation Age of Onset  . Coronary artery disease      female 1st degree relative <60/ female 1st degree <50  . Stroke      female 1st degree relatvie <50  . Allergies Sister     and daughter  . Allergy (severe) Sister   . Heart attack Sister   . Asthma Daughter   . Allergy (severe) Daughter   . Heart disease Mother   . Heart attack Mother   . Other Mother     rheumatism  . Heart disease Father   . Heart disease Paternal Grandfather   . Heart attack Paternal Grandfather   . Stroke Paternal Grandfather   . Cancer Paternal Grandfather     prostate  . Prostate cancer Maternal Grandfather   . Other Maternal Grandmother     rheumatism   History   Substance Use Topics  . Smoking status: Never Smoker   . Smokeless tobacco: Not on file  . Alcohol Use: Yes     Comment: occasional   OB History    No data available     Review of Systems  Constitutional: Negative for fever and chills.  Cardiovascular: Positive for chest pain.  Gastrointestinal: Positive for nausea and abdominal pain. Negative for vomiting and diarrhea.  Genitourinary: Positive for vaginal discharge.  Skin: Positive for color change.  All other systems reviewed and are negative.   Allergies  Sulfonamide derivatives  Home Medications   Prior to Admission medications   Medication Sig Start Date End Date Taking? Authorizing Provider  aspirin EC 81 MG tablet Take 81 mg by mouth daily.   Yes Historical Provider, MD  furosemide (LASIX) 40 MG tablet Take one tablet once daily every day.  OK to take afternoon dose if increased swelling 01/29/14  Yes Sandford CrazeMelissa O'Sullivan, NP  nebivolol (BYSTOLIC) 5 MG tablet Take 1 tablet (5 mg total) by mouth daily. 01/29/14  Yes Sandford CrazeMelissa O'Sullivan, NP  albuterol (PROVENTIL HFA;VENTOLIN HFA) 108 (90 BASE) MCG/ACT inhaler Inhale 2 puffs into the lungs every 6 (six) hours as needed for wheezing. 04/17/13  Sandford Craze, NP  fluconazole (DIFLUCAN) 150 MG tablet One tab by mouth today for yeast infection, may repeat in 3 days if symptoms not resolved. 10/28/13   Sandford Craze, NP  meloxicam (MOBIC) 7.5 MG tablet Take 1 tablet (7.5 mg total) by mouth daily. 01/27/14   Sandford Craze, NP  metFORMIN (GLUCOPHAGE-XR) 500 MG 24 hr tablet 2 tabs by mouth once daily. 11/25/12   Sandford Craze, NP  metroNIDAZOLE (FLAGYL) 500 MG tablet Take 1 tablet (500 mg total) by mouth 2 (two) times daily. 01/30/14   Sandford Craze, NP  omeprazole (PRILOSEC) 20 MG capsule Take 20 mg by mouth daily as needed. 11/25/12   Sandford Craze, NP  potassium chloride (MICRO-K) 10 MEQ CR capsule Take 1 capsule (10 mEq total) by mouth daily. 01/29/14    Sandford Craze, NP   BP 137/83 mmHg  Pulse 84  Temp(Src) 98.4 F (36.9 C) (Oral)  Resp 20  SpO2 96% Physical Exam  Constitutional: She is oriented to person, place, and time. Vital signs are normal. She appears well-developed and well-nourished. No distress.  Morbidly obese habitus  HENT:  Head: Normocephalic and atraumatic.  Cardiovascular: Normal rate, regular rhythm and normal heart sounds.   Pulmonary/Chest: Effort normal and breath sounds normal. No respiratory distress.  Neurological: She is alert and oriented to person, place, and time. She has normal strength. Coordination normal.  Skin: Skin is warm and dry. No rash noted. She is not diaphoretic.  Psychiatric: She has a normal mood and affect. Judgment normal.  Nursing note and vitals reviewed.   ED Course  ED EKG  Date/Time: 05/14/2014 8:20 AM Performed by: Autumn Messing, H Authorized by: Bradd Canary D Comparison: compared with previous ECG  Comparison to previous ECG: No significant change Rhythm: sinus rhythm Ectopy: atrial premature contractions Rate: normal QRS axis: normal Conduction: conduction normal ST Segments: ST segments normal T Waves: T waves normal Other: no other findings Clinical impression: normal ECG   (including critical care time) Labs Review Labs Reviewed - No data to display  Imaging Review No results found.     MDM   1. Other chest pain    Sent ED for evaluation of chest pain       Graylon Good, PA-C 05/10/14 1741  Graylon Good, PA-C 05/14/14 [phone]

## 2014-05-10 NOTE — ED Notes (Signed)
Patient transported to X-ray 

## 2014-05-10 NOTE — ED Notes (Signed)
Pt returned from scans. Monitored by pulse ox, bp cuff, and 5-lead. 

## 2014-05-10 NOTE — ED Provider Notes (Signed)
CSN: [id number]     Arrival date & time 05/10/14  1748 History   First MD Initiated Contact with Patient 05/10/14 1849     Chief Complaint  Patient presents with  . Chest Pain  . Vaginal Discharge     (Consider location/radiation/quality/duration/timing/severity/associated sxs/prior Treatment) HPI [name] is a 49 year old female with past medical history of hypertension, obesity, obstructive sleep apnea, who presents the ER complaining of chest pain, shortness of breath, cough, nasal congestion, vaginal discharge. Patient states her symptoms of a cough and chest pain began approximately 2 weeks ago, and have persisted. Patient states her chest pain has become worse over the past 2-3 days, she has noticed mild shortness of breath with it. Patient states walking, standing makes her chest pain worse. She describes a tightness which radiates to her back. Patient also states that coughing and deep inspiration makes her pain worse. Patient reports her vaginal discharge has been ongoing, and off-and-on for the past year. Patient states she was treated for bacterial vaginosis last year, and states she is recently been experiencing some the same symptoms. Patient states a foul odor, white colored discharge, and generalized irritation of her vagina. Patient states she is not currently sexually active. Patient denies nausea, vomiting, palpitations, dizziness, weakness, headache.  Past Medical History  Diagnosis Date  . Palpitation     event monito 2/10  . Chest pain     negative Myoview march 2009  . HTN (hypertension)     controlled  . OSA (obstructive sleep apnea)     mild  . Obesity   . Lipoma     Hx-left leg. s/p excision  . Rectal bleeding 5/10    normal colonoscopy. Dr. Arlyce DiceKaplan  . Yeast infection    Past Surgical History  Procedure Laterality Date  . Hysterectomy unspecified area    . Abdominal hysterectomy     Family History  Problem Relation Age of Onset  . Coronary artery  disease      female 1st degree relative <60/ female 1st degree <50  . Stroke      female 1st degree relatvie <50  . Allergies Sister     and daughter  . Allergy (severe) Sister   . Heart attack Sister   . Asthma Daughter   . Allergy (severe) Daughter   . Heart disease Mother   . Heart attack Mother   . Other Mother     rheumatism  . Heart disease Father   . Heart disease Paternal Grandfather   . Heart attack Paternal Grandfather   . Stroke Paternal Grandfather   . Cancer Paternal Grandfather     prostate  . Prostate cancer Maternal Grandfather   . Other Maternal Grandmother     rheumatism   History  Substance Use Topics  . Smoking status: Never Smoker   . Smokeless tobacco: Not on file  . Alcohol Use: Yes     Comment: occasional   OB History    No data available     Review of Systems  Constitutional: Negative for fever.  HENT: Negative for trouble swallowing.   Eyes: Negative for visual disturbance.  Respiratory: Positive for cough, chest tightness and shortness of breath.   Cardiovascular: Negative for chest pain.  Gastrointestinal: Negative for nausea, vomiting and abdominal pain.  Genitourinary: Positive for vaginal discharge and vaginal pain. Negative for dysuria.  Musculoskeletal: Negative for neck pain.  Skin: Negative for rash.  Neurological: Negative for dizziness, weakness and numbness.  Psychiatric/Behavioral: Negative.  Allergies  Sulfonamide derivatives  Home Medications   Prior to Admission medications   Medication Sig Start Date End Date Taking? Authorizing Provider  albuterol (PROVENTIL HFA;VENTOLIN HFA) 108 (90 BASE) MCG/ACT inhaler Inhale 2 puffs into the lungs every 6 (six) hours as needed for wheezing. 04/17/13  Yes Sandford Craze, NP  aspirin EC 81 MG tablet Take 81 mg by mouth daily.   Yes Historical Provider, MD  furosemide (LASIX) 40 MG tablet Take one tablet once daily every day.  OK to take afternoon dose if increased swelling  01/29/14  Yes Sandford Craze, NP  meloxicam (MOBIC) 7.5 MG tablet Take 1 tablet (7.5 mg total) by mouth daily. 01/27/14  Yes Sandford Craze, NP  metFORMIN (GLUCOPHAGE-XR) 500 MG 24 hr tablet 2 tabs by mouth once daily. 11/25/12  Yes Sandford Craze, NP  nebivolol (BYSTOLIC) 5 MG tablet Take 1 tablet (5 mg total) by mouth daily. 01/29/14  Yes Sandford Craze, NP  omeprazole (PRILOSEC) 20 MG capsule Take 20 mg by mouth daily as needed. 11/25/12  Yes Sandford Craze, NP  potassium chloride (MICRO-K) 10 MEQ CR capsule Take 1 capsule (10 mEq total) by mouth daily. 01/29/14  Yes Sandford Craze, NP  benzonatate (TESSALON) 100 MG capsule Take 1 capsule (100 mg total) by mouth every 8 (eight) hours. 05/10/14   Monte Fantasia, PA-C  metroNIDAZOLE (FLAGYL) 500 MG tablet Take 1 tablet (500 mg total) by mouth 2 (two) times daily. One po bid x 7 days 05/10/14   Monte Fantasia, PA-C  predniSONE (DELTASONE) 20 MG tablet Take 2 tablets (40 mg total) by mouth daily. 05/10/14   Monte Fantasia, PA-C   BP 112/65 mmHg  Pulse 98  Resp 18  SpO2 97% Physical Exam  Constitutional: She is oriented to person, place, and time. She appears well-developed and well-nourished. No distress.  HENT:  Head: Normocephalic and atraumatic.  Mouth/Throat: Oropharynx is clear and moist. No oropharyngeal exudate.  Eyes: Right eye exhibits no discharge. Left eye exhibits no discharge. No scleral icterus.  Neck: Normal range of motion.  Cardiovascular: Normal rate, regular rhythm, S1 normal, S2 normal and normal heart sounds.   No murmur heard. Pulses:      Radial pulses are 2+ on the right side, and 2+ on the left side.       Dorsalis pedis pulses are 2+ on the right side, and 2+ on the left side.  Pulmonary/Chest: Effort normal and breath sounds normal. No respiratory distress.    Abdominal: Soft. There is no tenderness.  Genitourinary: There is no rash, tenderness, lesion or injury on the right labia. There is no  rash, tenderness, lesion or injury on the left labia. Right adnexum displays no mass, no tenderness and no fullness. Left adnexum displays no mass, no tenderness and no fullness. No erythema, tenderness or bleeding in the vagina. No foreign body around the vagina. No signs of injury around the vagina. Vaginal discharge found.  Moderate amount of white colored, thick discharge noted in vaginal vault. Cervix surgically absent. No tenderness on exam. Chaperone present during entire pelvic exam.  Musculoskeletal: Normal range of motion. She exhibits no edema or tenderness.  Neurological: She is alert and oriented to person, place, and time. She has normal strength. No cranial nerve deficit or sensory deficit. Coordination normal.  Patient fully alert, answering questions appropriately in full, clear sentences. Cranial nerves II through XII grossly intact. Motor strength 5 out of 5 in all major muscle groups of upper and  lower extremities. Distal sensation intact.  Skin: Skin is warm and dry. No rash noted. She is not diaphoretic.  Psychiatric: She has a normal mood and affect.  Nursing note and vitals reviewed.   ED Course  Procedures (including critical care time) Labs Review Labs Reviewed  WET PREP, GENITAL - Abnormal; Notable for the following:    Clue Cells Wet Prep HPF POC FEW (*)    WBC, Wet Prep HPF POC FEW (*)    All other components within normal limits  CBC - Abnormal; Notable for the following:    WBC 10.6 (*)    All other components within normal limits  BASIC METABOLIC PANEL - Abnormal; Notable for the following:    Glucose, Bld 102 (*)    GFR calc non Af Amer 77 (*)    GFR calc Af Amer 89 (*)    All other components within normal limits  BRAIN NATRIURETIC PEPTIDE  D-DIMER, QUANTITATIVE  I-STAT TROPOININ, ED  POC URINE PREG, ED  GC/CHLAMYDIA PROBE AMP ([place])    Imaging Review Dg Chest 2 View  05/10/2014   CLINICAL DATA:  Initial encounter for chest pain associated  with shortness of Breath and indigestion.  EXAM: CHEST  2 VIEW  COMPARISON:  06/17/2008  FINDINGS: Midline trachea. Normal heart size and mediastinal contours. No pleural effusion or pneumothorax. Clear lungs.  IMPRESSION: Normal chest.   Electronically Signed   By: Jeronimo Greaves M.D.   On: 05/10/2014 19:40     EKG Interpretation None      MDM   Final diagnoses:  Bronchitis  BV (bacterial vaginosis)    Patient here with multiple complaints. Patient reporting a cough and chest discomfort which is pleuritic in nature and been ongoing for 2 weeks. Troponin tonight is negative. Patient's EKG does not show evidence of acute injury or ectopy. Troponin negative, with symptoms ongoing for greater than 6 hours and negative troponin once is sensitive for rule out of ACS along with signs and symptoms of atypical chest pain for anginal equivalent. Patient initially PERC negative, however when patient is ambulated with checking of her pulse oximetry, her pulse ox drops to 90% and heart rate increases between 101 130. Patient's heart rate returns immediately back in the 80s to 90s when at rest. Throughout patient's stay she has been ambulating multiple times to the bathroom without any distress or difficulty. Patient denying any chest pain with his episode, only shortness of breath. Likely patient may be experiencing a bronchitis despite clear lung sounds we will administer albuterol and see if this helps patient's symptoms. D-dimer also sent.  Pelvic exam remarkable for some mild white colored discharge. Few clue cells noted on wet prep. With patient describing vaginal irritation and discharge, we will treat for bacterial vaginosis.  D-dimer negative, no concern for PE. Chest x-ray negative, no concern for pneumonia or pneumothorax. Patient reporting improvement of symptoms after albuterol nebulizer. With pleuritic chest pain and improvement after albuterol, and the fact the patient has been well appearing,  non-hypoxic, non-tachycardic, nontachypneic, well-appearing and in no acute distress other than the episode while ambulating, I believe patient's symptoms are largely due to an acute bronchitis. Patient treated with prednisone short course, cough medicine and given albuterol inhaler. Patient remaining hemodynamically stable, and discharged. I discussed return precautions with patient, and strongly encouraged patient to follow up with a primary care physician. Patient verbalizes understanding and agreement of this plan. I encouraged patient to call or return to the ER with  any worsening of symptoms or should she have any questions or concerns.  BP 112/65 mmHg  Pulse 98  Resp 18  SpO2 97%  Signed,  Ladona Mow, PA-C 1:18 AM  Patient discussed with Dr. Rolan Bucco, MD  Monte Fantasia, PA-C 05/11/14 1610  Rolan Bucco, MD 05/14/14 1515

## 2014-05-10 NOTE — ED Notes (Signed)
Pt ambulated in hallway. Pt denies dizziness or lightheadedness. Pt has steady gait. PT admits to being out of breath while ambulating. PT's o2 saturation stayed between 90-92% while ambulating. Pt's o2 saturation when back to 100% ten seconds after sitting back down. Pt's hr stayed between 100-130bpm while ambulating. Pt's hr returned to the 80-90bpm after returning to bed.

## 2014-05-10 NOTE — ED Notes (Signed)
Presents with chest pain began one week ago associated with SOB and indigestion, radiates into back with standing. Walking makes SOB and pain worse. Endorses vaginal discharge and foul smell, recently treated for bacterial infection and did not finish medication. She is concerned "I have the inffection in my whole body and in my blood. My hands are splotchy and my face is red and I am worried I have an infection in my blood and body., My family member recently died from a blood infection" left leg swelling that she reports happens at times.

## 2014-05-10 NOTE — ED Notes (Signed)
PT returned from bathroom. Monitored by pulse ox, bp cuff, and 5-lead. 

## 2014-05-10 NOTE — ED Notes (Signed)
C/o chest pain  & SOB x 1 week, abdominal bloating and swelling, vaginal odor and dc x 6 months. Reportedly was given a Rx 6 months ago , but during the course of care for her mother, her RX was misplaced. Found it again and started taking it and this helped her symptoms some. Pain in chest reproducable w direct pressure . NAD, w/d/color good

## 2014-05-10 NOTE — Discharge Instructions (Signed)
Acute Bronchitis Bronchitis is inflammation of the airways that extend from the windpipe into the lungs (bronchi). The inflammation often causes mucus to develop. This leads to a cough, which is the most common symptom of bronchitis.  In acute bronchitis, the condition usually develops suddenly and goes away over time, usually in a couple weeks. Smoking, allergies, and asthma can make bronchitis worse. Repeated episodes of bronchitis may cause further lung problems.  CAUSES Acute bronchitis is most often caused by the same virus that causes a cold. The virus can spread from person to person (contagious) through coughing, sneezing, and touching contaminated objects. SIGNS AND SYMPTOMS   Cough.   Fever.   Coughing up mucus.   Body aches.   Chest congestion.   Chills.   Shortness of breath.   Sore throat.  DIAGNOSIS  Acute bronchitis is usually diagnosed through a physical exam. Your health care provider will also ask you questions about your medical history. Tests, such as chest X-rays, are sometimes done to rule out other conditions.  TREATMENT  Acute bronchitis usually goes away in a couple weeks. Oftentimes, no medical treatment is necessary. Medicines are sometimes given for relief of fever or cough. Antibiotic medicines are usually not needed but may be prescribed in certain situations. In some cases, an inhaler may be recommended to help reduce shortness of breath and control the cough. A cool mist vaporizer may also be used to help thin bronchial secretions and make it easier to clear the chest.  HOME CARE INSTRUCTIONS  Get plenty of rest.   Drink enough fluids to keep your urine clear or pale yellow (unless you have a medical condition that requires fluid restriction). Increasing fluids may help thin your respiratory secretions (sputum) and reduce chest congestion, and it will prevent dehydration.   Take medicines only as directed by your health care provider.  If  you were prescribed an antibiotic medicine, finish it all even if you start to feel better.  Avoid smoking and secondhand smoke. Exposure to cigarette smoke or irritating chemicals will make bronchitis worse. If you are a smoker, consider using nicotine gum or skin patches to help control withdrawal symptoms. Quitting smoking will help your lungs heal faster.   Reduce the chances of another bout of acute bronchitis by washing your hands frequently, avoiding people with cold symptoms, and trying not to touch your hands to your mouth, nose, or eyes.   Keep all follow-up visits as directed by your health care provider.  SEEK MEDICAL CARE IF: Your symptoms do not improve after 1 week of treatment.  SEEK IMMEDIATE MEDICAL CARE IF:  You develop an increased fever or chills.   You have chest pain.   You have severe shortness of breath.  You have bloody sputum.   You develop dehydration.  You faint or repeatedly feel like you are going to pass out.  You develop repeated vomiting.  You develop a severe headache. MAKE SURE YOU:   Understand these instructions.  Will watch your condition.  Will get help right away if you are not doing well or get worse. Document Released: 05/03/2004 Document Revised: 08/10/2013 Document Reviewed: 09/16/2012 [facility] Patient Information 2015 [place], [place]. This information is not intended to replace advice given to you by your health care provider. Make sure you discuss any questions you have with your health care provider.  Bacterial Vaginosis Bacterial vaginosis is a vaginal infection that occurs when the normal balance of bacteria in the vagina is disrupted. It results  from an overgrowth of certain bacteria. This is the most common vaginal infection in women of childbearing age. Treatment is important to prevent complications, especially in pregnant women, as it can cause a premature delivery. CAUSES  Bacterial vaginosis is caused by an increase  in harmful bacteria that are normally present in smaller amounts in the vagina. Several different kinds of bacteria can cause bacterial vaginosis. However, the reason that the condition develops is not fully understood. RISK FACTORS Certain activities or behaviors can put you at an increased risk of developing bacterial vaginosis, including:  Having a new sex partner or multiple sex partners.  Douching.  Using an intrauterine device (IUD) for contraception. Women do not get bacterial vaginosis from toilet seats, bedding, swimming pools, or contact with objects around them. SIGNS AND SYMPTOMS  Some women with bacterial vaginosis have no signs or symptoms. Common symptoms include:  Grey vaginal discharge.  A fishlike odor with discharge, especially after sexual intercourse.  Itching or burning of the vagina and vulva.  Burning or pain with urination. DIAGNOSIS  Your health care provider will take a medical history and examine the vagina for signs of bacterial vaginosis. A sample of vaginal fluid may be taken. Your health care provider will look at this sample under a microscope to check for bacteria and abnormal cells. A vaginal pH test may also be done.  TREATMENT  Bacterial vaginosis may be treated with antibiotic medicines. These may be given in the form of a pill or a vaginal cream. A second round of antibiotics may be prescribed if the condition comes back after treatment.  HOME CARE INSTRUCTIONS   Only take over-the-counter or prescription medicines as directed by your health care provider.  If antibiotic medicine was prescribed, take it as directed. Make sure you finish it even if you start to feel better.  Do not have sex until treatment is completed.  Tell all sexual partners that you have a vaginal infection. They should see their health care provider and be treated if they have problems, such as a mild rash or itching.  Practice safe sex by using condoms and only having one  sex partner. SEEK MEDICAL CARE IF:   Your symptoms are not improving after 3 days of treatment.  You have increased discharge or pain.  You have a fever. MAKE SURE YOU:   Understand these instructions.  Will watch your condition.  Will get help right away if you are not doing well or get worse. FOR MORE INFORMATION  Centers for Disease Control and Prevention, Division of STD Prevention: SolutionApps.co.za American Sexual Health Association (ASHA): www.ashastd.org  Document Released: 03/26/2005 Document Revised: 01/14/2013 Document Reviewed: 11/05/2012 [facility] Patient Information 2015 [place], [place]. This information is not intended to replace advice given to you by your health care provider. Make sure you discuss any questions you have with your health care provider.   Emergency Department Resource Guide 1) Find a Doctor and Pay Out of Pocket Although you won't have to find out who is covered by your insurance plan, it is a good idea to ask around and get recommendations. You will then need to call the office and see if the doctor you have chosen will accept you as a new patient and what types of options they offer for patients who are self-pay. Some doctors offer discounts or will set up payment plans for their patients who do not have insurance, but you will need to ask so you aren't surprised when you get to  your appointment.  2) Contact Your Local Health Department Not all health departments have doctors that can see patients for sick visits, but many do, so it is worth a call to see if yours does. If you don't know where your local health department is, you can check in your phone book. The CDC also has a tool to help you locate your state's health department, and many state websites also have listings of all of their local health departments.  3) Find a Walk-in Clinic If your illness is not likely to be very severe or complicated, you may want to try a walk in clinic. These are  popping up all over the country in pharmacies, drugstores, and shopping centers. They're usually staffed by nurse practitioners or physician assistants that have been trained to treat common illnesses and complaints. They're usually fairly quick and inexpensive. However, if you have serious medical issues or chronic medical problems, these are probably not your best option.  No Primary Care Doctor: - Call Health Connect at  [phone] - they can help you locate a primary care doctor that  accepts your insurance, provides certain services, etc. - Physician Referral Service- [phone]  Chronic Pain Problems: Organization         Address  Phone   Notes  [facility]  [phone] Patients need to be referred by their primary care doctor.   Medication Assistance: Organization         Address  Phone   Notes  [facility] Medication [facility] [address] [place] [place], [place] [address] [phone] --Must be a resident of [facility] -- Must have NO insurance coverage whatsoever (no Medicaid/ Medicare, etc.) -- The pt. MUST have a primary care doctor that directs their care regularly and follows them in the community   [facility]ist  [phone]   [employer]  [phone]    Agencies that provide inexpensive medical care: Organization         Address  Phone   Notes  [facility]  [phone]   [facility]    [phone]   [facility] [address] [place], [place] [address] [phone]   [facility] [address] [place] [place] [phone]   Planned Parenthood    [phone]   [facility]    [phone]   [facility] and [facility]  [address], [place] Phone:  [phone], Fax:  [phone] Hours of Operation:  9 am - 6 pm, M-F.  Also accepts Medicaid/Medicare and self-pay.  [facility]  [address], [place] Phone: [phone], Fax: [phone]. Hours of Operation:  8:30 am - 5:30 pm, M-F.  Also accepts Medicaid and self-pay.  [facility] [place] [address] [place] Phone: [phone]   [facility] [address] Natasha Bence [place], [place] [phone], Ext. [phone] Mondays & Thursdays: 7-9 AM.  First 15 patients are seen on a first come, first serve basis.    Medicaid-accepting [facility] Providers:  Organization         Address  Phone   Notes  [facility] [address], [place] [phone] Also accepts self-pay patients.  [facility] [address] Laurell Josephs [place], [place]  [phone]   [facility] [phone], [place] [phone]   [facility] [address], [place] [phone]   Renaye Rakers [address], [place]   [phone] Only accepts [place] patients after they have their name applied to their card.   Self-Pay (no insurance) in [facility]:  Organization         Address  Phone   Notes  Sickle Cell Patients, [facility] [address] [place], [place] [phone]   [facility] [address] [place], [place] [phone]   [facility] [place]  [address], [place] [phone]   [facility]/Dr. Osei-Bonsu  [address], [place] or [address], [place] [phone] Phone number for both [place] and [place] locations is the same.  [facility]gent Medical and [facility] [address] [place] [phone]   [facility] [address], [place] or [address] [phone]   [facility] [address] [place] [phone], phone; [phone], fax Sees patients 1st and 3rd Saturday  of every month.  Must not qualify for public or private insurance (i.e. Medicaid, Medicare, [place] Health Choice, Veterans' Benefits)  Household income should be no more than 200% of the poverty level The clinic cannot treat you if you are pregnant or think you are pregnant  Sexually transmitted diseases are not treated at the clinic.    Dental Care: Organization         Address  Phone  Notes  [facility] [address] [place], [place] [phone] Accepts children up to age 40 who are enrolled in [place] or [place] Health Choice; pregnant women with a Medicaid card; and children who have applied for Medicaid or [place] Health Choice, but were declined, whose parents can pay a reduced fee at time of service.  [facility]  [address], [place] [phone] Accepts children up to age 43 who are enrolled in [place] or [place] Health Choice; pregnant women with a Medicaid card; and children who have applied for Medicaid or  Health Choice, but were declined, whose parents can pay a reduced fee at time of service.  [facility]  [address] [place], [place] [phone] Patients are seen by appointment only. Walk-ins are not accepted. [facility] will see patients 26 years of age and older. Monday - Tuesday (8am-5pm) Most Wednesdays (8:30-5pm) $30 per visit, cash only  [facility]  [address], [facility] [phone] Patients are seen by appointment only. Walk-ins are not accepted. [facility] will see patients 27 years of age and older. One Wednesday Evening (Monthly: Volunteer Based).  $30 per visit, cash only  [employer]  [phone] for adults; Children under age 73, call Graduate Pediatric Dentistry at [phone]. Children aged 10-14, please call [phone] to request a pediatric application.   Dental services are provided in all areas of dental care including fillings, crowns and bridges, complete and partial dentures, implants, gum treatment, root canals, and extractions. Preventive care is also provided. Treatment is provided to both adults and children. Patients are selected via a lottery and there is often a waiting list.   [facility] [phone], [place]  [phone] [email or web address]   [facility] [address], [place], [place] [phone], Ext. [phone] Second and Fourth Thursday of each month, opens at 6:30 AM; Clinic ends at 9 AM.  Patients are seen on a first-come first-served basis, and a limited number are seen during each clinic.   [facility]  [address], [place], [place] [phone]   Eligibility Requirements You must have lived in [place], [place], or [place] counties for at least the last three months.   You cannot be eligible for state or federal sponsored [place] Cityhealthcare insurance, including CIG[employer]Veterans Administration, [place]icaid, or [employer]icare.   You generally cannot be eligible for healthcare insurance through your employer.    How to apply: Eligibility screenings are held every Tuesday and Wednesday afternoon from 1:00 pm until 4:00 pm. You do not need an appointment for the interview!  [facility] [address], [place], [place] [phone]   [facility]  [phone]   [facility]  [phone]   [facility]  [phone]    Behavioral Health Resources in the Community: Intensive Outpatient Programs Organization         Address  Phone  Notes  [facility] [address], [place], [place] [phone]   [facility] [address], [place], [place] [phone]   [facility] [address], [place], [place]  [phone]   [facility] [address],  [place], [place] [phone] or [phone]   Substance Abuse Resources Organization         Address  Phone  Notes  [facility]  [phone]   [facility]  [phone]   [facility]  [phone]   [facility]  [phone]   Residential & Outpatient Substance Abuse Program  [phone]   Psychological Services Organization         Address  Phone  Notes  [facility]  [phone]- [phone]   [facility]  [phone]- [phone]   [facility] [address], [place] [phone] or [phone]    Mobile Crisis Teams Organization         Address  Phone  Notes  [facility], Mobile Crisis Care Unit  [phone]   [facility]  [address] [place], [place] [phone]   Doristine LocksSharon DeEsch [address] [place] [phone]    Self-Help/Support Groups Organization         Address  Phone             Notes  [facility] [place] - variety of support groups  336- [phone] Call for more information  Narcotics Anonymous (NA), [facility] [address], [place]  2 meetings at this location   Statisticianesidential Treatment Programs Organization         Address  Phone  Notes  [facility] [address],    [place]  [phone]   [facility]  [address], [place] [phone], [place], [place] [phone]   [facility] [address], [place] [phone] Admissions: 8am-3pm M-F  [facility] [address]    [place], [place] [phone]   [facility] [address], [place], [place] [phone]   [facility] [address]  [place], [place] [phone]  [facility] - Intensive Outpatient [address] Laurell [address], [place], [place] [phone]   [facility] ([facility] [address] [place].,  [place], [place]  [phone] or [phone]   [facility] ([facility]) [address], [place], [place] [phone] Accepts Medicaid  [facility] [place] [address],  [place] [phone] Substance Abuse/Addiction Treatment   [facility] Organization         Address  Phone  Notes  [facility]  [phone]   Angie Fava, PhD [address] Ervin Knack [place], [place]   [phone] or [phone]   [facility]   [address] [place] [address] [place], [place] [phone]   [facility] [address], [place], [place] [phone] Insurance/Medicaid/sponsorship through [facility] and Families [address]                                    [place], [place] [phone] Therapy/tele-psych/case  [facility] [address] [place] [address], [place] [phone]    Dr. Lolly Mustache  [phone]   [facility] [place]  [employer] [facility] Dept. 1) [address], [place] 2) [address], [place] 3)  [address], [place] [phone]  [phone]   [facility] Child Abuse Hotline [phone] or [phone] (After Hours)

## 2014-05-11 LAB — GC/CHLAMYDIA PROBE AMP (~~LOC~~) NOT AT ARMC
CHLAMYDIA, DNA PROBE: NEGATIVE
Neisseria Gonorrhea: NEGATIVE

## 2014-08-30 ENCOUNTER — Ambulatory Visit (HOSPITAL_BASED_OUTPATIENT_CLINIC_OR_DEPARTMENT_OTHER): Payer: BLUE CROSS/BLUE SHIELD

## 2014-08-30 ENCOUNTER — Other Ambulatory Visit (HOSPITAL_BASED_OUTPATIENT_CLINIC_OR_DEPARTMENT_OTHER): Payer: Self-pay | Admitting: Family Medicine

## 2014-08-30 DIAGNOSIS — Z1231 Encounter for screening mammogram for malignant neoplasm of breast: Secondary | ICD-10-CM

## 2014-09-15 ENCOUNTER — Emergency Department (HOSPITAL_COMMUNITY): Payer: BLUE CROSS/BLUE SHIELD

## 2014-09-15 ENCOUNTER — Emergency Department (HOSPITAL_COMMUNITY)
Admission: EM | Admit: 2014-09-15 | Discharge: 2014-09-15 | Disposition: A | Discharge status: Home / Self Care | Payer: BLUE CROSS/BLUE SHIELD | Attending: Emergency Medicine | Admitting: Emergency Medicine

## 2014-09-15 ENCOUNTER — Encounter (HOSPITAL_COMMUNITY): Payer: Self-pay | Admitting: Emergency Medicine

## 2014-09-15 DIAGNOSIS — I10 Essential (primary) hypertension: Secondary | ICD-10-CM | POA: Diagnosis not present

## 2014-09-15 DIAGNOSIS — Z79899 Other long term (current) drug therapy: Secondary | ICD-10-CM | POA: Insufficient documentation

## 2014-09-15 DIAGNOSIS — R609 Edema, unspecified: Secondary | ICD-10-CM

## 2014-09-15 DIAGNOSIS — M546 Pain in thoracic spine: Secondary | ICD-10-CM | POA: Diagnosis not present

## 2014-09-15 DIAGNOSIS — Z8669 Personal history of other diseases of the nervous system and sense organs: Secondary | ICD-10-CM | POA: Insufficient documentation

## 2014-09-15 DIAGNOSIS — M545 Low back pain: Secondary | ICD-10-CM | POA: Insufficient documentation

## 2014-09-15 DIAGNOSIS — Z86018 Personal history of other benign neoplasm: Secondary | ICD-10-CM | POA: Diagnosis not present

## 2014-09-15 DIAGNOSIS — M549 Dorsalgia, unspecified: Secondary | ICD-10-CM | POA: Diagnosis present

## 2014-09-15 DIAGNOSIS — Z8619 Personal history of other infectious and parasitic diseases: Secondary | ICD-10-CM | POA: Insufficient documentation

## 2014-09-15 DIAGNOSIS — Z8719 Personal history of other diseases of the digestive system: Secondary | ICD-10-CM | POA: Insufficient documentation

## 2014-09-15 DIAGNOSIS — M5489 Other dorsalgia: Secondary | ICD-10-CM

## 2014-09-15 DIAGNOSIS — R109 Unspecified abdominal pain: Secondary | ICD-10-CM

## 2014-09-15 DIAGNOSIS — Z7982 Long term (current) use of aspirin: Secondary | ICD-10-CM | POA: Insufficient documentation

## 2014-09-15 LAB — CBC WITH DIFFERENTIAL/PLATELET
BASOS ABS: 0 10*3/uL (ref 0.0–0.1)
Basophils Relative: 0 % (ref 0–1)
Eosinophils Absolute: 0.1 10*3/uL (ref 0.0–0.7)
Eosinophils Relative: 1 % (ref 0–5)
HCT: 43.1 % (ref 36.0–46.0)
Hemoglobin: 14 g/dL (ref 12.0–15.0)
LYMPHS ABS: 2.5 10*3/uL (ref 0.7–4.0)
Lymphocytes Relative: 32 % (ref 12–46)
MCH: 28.1 pg (ref 26.0–34.0)
MCHC: 32.5 g/dL (ref 30.0–36.0)
MCV: 86.5 fL (ref 78.0–100.0)
Monocytes Absolute: 0.6 10*3/uL (ref 0.1–1.0)
Monocytes Relative: 7 % (ref 3–12)
NEUTROS ABS: 4.6 10*3/uL (ref 1.7–7.7)
Neutrophils Relative %: 60 % (ref 43–77)
Platelets: 322 10*3/uL (ref 150–400)
RBC: 4.98 MIL/uL (ref 3.87–5.11)
RDW: 13.3 % (ref 11.5–15.5)
WBC: 7.7 10*3/uL (ref 4.0–10.5)

## 2014-09-15 LAB — URINALYSIS, ROUTINE W REFLEX MICROSCOPIC
BILIRUBIN URINE: NEGATIVE
GLUCOSE, UA: NEGATIVE mg/dL
Hgb urine dipstick: NEGATIVE
Ketones, ur: NEGATIVE mg/dL
Leukocytes, UA: NEGATIVE
Nitrite: NEGATIVE
Protein, ur: NEGATIVE mg/dL
Specific Gravity, Urine: >1.03 — ABNORMAL HIGH (ref 1.005–1.030)
Urobilinogen, UA: 1 mg/dL (ref 0.0–1.0)
pH: 6 (ref 5.0–8.0)

## 2014-09-15 LAB — COMPREHENSIVE METABOLIC PANEL
ALK PHOS: 85 U/L (ref 38–126)
ALT: 20 U/L (ref 14–54)
ANION GAP: 10 (ref 5–15)
AST: 18 U/L (ref 15–41)
Albumin: 3.9 g/dL (ref 3.5–5.0)
BUN: 15 mg/dL (ref 6–20)
CO2: 25 mmol/L (ref 22–32)
Calcium: 9 mg/dL (ref 8.9–10.3)
Chloride: 103 mmol/L (ref 101–111)
Creatinine, Ser: 0.93 mg/dL (ref 0.44–1.00)
Glucose, Bld: 101 mg/dL — ABNORMAL HIGH (ref 65–99)
Potassium: 3.9 mmol/L (ref 3.5–5.1)
Sodium: 138 mmol/L (ref 135–145)
TOTAL PROTEIN: 7.5 g/dL (ref 6.5–8.1)
Total Bilirubin: 0.5 mg/dL (ref 0.3–1.2)

## 2014-09-15 LAB — WET PREP, GENITAL
Clue Cells Wet Prep HPF POC: NONE SEEN
Trich, Wet Prep: NONE SEEN
YEAST WET PREP: NONE SEEN

## 2014-09-15 LAB — BRAIN NATRIURETIC PEPTIDE: B NATRIURETIC PEPTIDE 5: 13 pg/mL (ref 0.0–100.0)

## 2014-09-15 LAB — TROPONIN I

## 2014-09-15 MED ORDER — KETOROLAC TROMETHAMINE 30 MG/ML IJ SOLN
30.0000 mg | Freq: Once | INTRAMUSCULAR | Status: AC
  Administered 2014-09-15: 30 mg via INTRAVENOUS
  Filled 2014-09-15: qty 1

## 2014-09-15 MED ORDER — IBUPROFEN 800 MG PO TABS: 800.0000 mg | ORAL_TABLET | Freq: Three times a day (TID) | ORAL | Status: DC | PRN

## 2014-09-15 MED ORDER — FUROSEMIDE 10 MG/ML IJ SOLN
40.0000 mg | Freq: Once | INTRAMUSCULAR | Status: AC
  Administered 2014-09-15: 40 mg via INTRAVENOUS
  Filled 2014-09-15: qty 4

## 2014-09-15 MED ORDER — FUROSEMIDE 40 MG PO TABS: 40.0000 mg | ORAL_TABLET | Freq: Every day | ORAL | Status: DC

## 2014-09-15 NOTE — Discharge Instructions (Signed)
Back Pain, Adult °Low back pain is very common. About 1 in 5 people have back pain. The cause of low back pain is rarely dangerous. The pain often gets better over time. About half of people with a sudden onset of back pain feel better in just 2 weeks. About 8 in 10 people feel better by 6 weeks.  °CAUSES °Some common causes of back pain include: °· Strain of the muscles or ligaments supporting the spine. °· Wear and tear (degeneration) of the spinal discs. °· Arthritis. °· Direct injury to the back. °DIAGNOSIS °Most of the time, the direct cause of low back pain is not known. However, back pain can be treated effectively even when the exact cause of the pain is unknown. Answering your caregiver's questions about your overall health and symptoms is one of the most accurate ways to make sure the cause of your pain is not dangerous. If your caregiver needs more information, he or she may order lab work or imaging tests (X-rays or MRIs). However, even if imaging tests show changes in your back, this usually does not require surgery. °HOME CARE INSTRUCTIONS °For many people, back pain returns. Since low back pain is rarely dangerous, it is often a condition that people can learn to manage on their own.  °· Remain active. It is stressful on the back to sit or stand in one place. Do not sit, drive, or stand in one place for more than 30 minutes at a time. Take short walks on level surfaces as soon as pain allows. Try to increase the length of time you walk each day. °· Do not stay in bed. Resting more than 1 or 2 days can delay your recovery. °· Do not avoid exercise or work. Your body is made to move. It is not dangerous to be active, even though your back may hurt. Your back will likely heal faster if you return to being active before your pain is gone. °· Pay attention to your body when you  bend and lift. Many people have less discomfort when lifting if they bend their knees, keep the load close to their bodies, and  avoid twisting. Often, the most comfortable positions are those that put less stress on your recovering back. °· Find a comfortable position to sleep. Use a firm mattress and lie on your side with your knees slightly bent. If you lie on your back, put a pillow under your knees. °· Only take over-the-counter or prescription medicines as directed by your caregiver. Over-the-counter medicines to reduce pain and inflammation are often the most helpful. Your caregiver may prescribe muscle relaxant drugs. These medicines help dull your pain so you can more quickly return to your normal activities and healthy exercise. °· Put ice on the injured area. °¨ Put ice in a plastic bag. °¨ Place a towel between your skin and the bag. °¨ Leave the ice on for 15-20 minutes, 03-04 times a day for the first 2 to 3 days. After that, ice and heat may be alternated to reduce pain and spasms. °· Ask your caregiver about trying back exercises and gentle massage. This may be of some benefit. °· Avoid feeling anxious or stressed. Stress increases muscle tension and can worsen back pain. It is important to recognize when you are anxious or stressed and learn ways to manage it. Exercise is a great option. °SEEK MEDICAL CARE IF: °· You have pain that is not relieved with rest or medicine. °· You have pain that does not improve in 1 week. °· You have new symptoms. °· You are generally not feeling well. °SEEK   IMMEDIATE MEDICAL CARE IF:   You have pain that radiates from your back into your legs.  You develop new bowel or bladder control problems.  You have unusual weakness or numbness in your arms or legs.  You develop nausea or vomiting.  You develop abdominal pain.  You feel faint. Document Released: 03/26/2005 Document Revised: 09/25/2011 Document Reviewed: 07/28/2013 [facility] Patient Information 2015 [place], [place]. This information is not intended to replace advice given to you by your health care provider. Make sure you  discuss any questions you have with your health care provider.  Peripheral Edema You have swelling in your legs (peripheral edema). This swelling is due to excess accumulation of salt and water in your body. Edema may be a sign of heart, kidney or liver disease, or a side effect of a medication. It may also be due to problems in the leg veins. Elevating your legs and using special support stockings may be very helpful, if the cause of the swelling is due to poor venous circulation. Avoid long periods of standing, whatever the cause. Treatment of edema depends on identifying the cause. Chips, pretzels, pickles and other salty foods should be avoided. Restricting salt in your diet is almost always needed. Water pills (diuretics) are often used to remove the excess salt and water from your body via urine. These medicines prevent the kidney from reabsorbing sodium. This increases urine flow. Diuretic treatment may also result in lowering of potassium levels in your body. Potassium supplements may be needed if you have to use diuretics daily. Daily weights can help you keep track of your progress in clearing your edema. You should call your caregiver for follow up care as recommended. SEEK IMMEDIATE MEDICAL CARE IF:   You have increased swelling, pain, redness, or heat in your legs.  You develop shortness of breath, especially when lying down.  You develop chest or abdominal pain, weakness, or fainting.  You have a fever. Document Released: 05/03/2004 Document Revised: 06/18/2011 Document Reviewed: 04/13/2009 [facility] Patient Information 2015 [place], [place]. This information is not intended to replace advice given to you by your health care provider. Make sure you discuss any questions you have with your health care provider.

## 2014-09-15 NOTE — ED Provider Notes (Signed)
TIME SEEN: 3:35 PM  CHIEF COMPLAINT: back pain, dysuria, LE swelling, SOB, vaginal discharge  HPI: Pt is a 49 y.o. female with history of hypertension, obesity who presents emergency department with multiple complaints. She reports that her grandmother is here so she "decided to get checked out". She states that she has had several days of dysuria, dark colored urine and bilateral flank pain that is worse on the right. Has a history of kidney stones. No history of injury to the back. No numbness, tingling or focal weakness. No bowel or bladder incontinence.  Also complaining of 2 weeks of shortness of breath worse with exertion, lower extreme swelling. No chest pain. No cough. No history of CHF, CAD, PE or DVT. States she is on Lasix 40 mg daily.  Also complaining of vaginal discharge for several days. No odor or itching. No vaginal bleeding. She is status post hysterectomy. She is sexually active and states she is only having oral sex. Denies prior history of STD. Denies nausea, vomiting or diarrhea. No fever.  ROS: See HPI Constitutional: no fever  Eyes: no drainage  ENT: no runny nose   Cardiovascular:  no chest pain  Resp:  SOB  GI: no vomiting GU:  dysuria Integumentary: no rash  Allergy: no hives  Musculoskeletal:  leg swelling  Neurological: no slurred speech ROS otherwise negative  PAST MEDICAL HISTORY/PAST SURGICAL HISTORY:  Past Medical History  Diagnosis Date  . Palpitation     event monito 2/10  . Chest pain     negative Myoview march 2009  . HTN (hypertension)     controlled  . OSA (obstructive sleep apnea)     mild  . Obesity   . Lipoma     Hx-left leg. s/p excision  . Rectal bleeding 5/10    normal colonoscopy. Dr. Arlyce DiceKaplan  . Yeast infection     MEDICATIONS:  Prior to Admission medications   Medication Sig Start Date End Date Taking? Authorizing Provider  albuterol (PROVENTIL HFA;VENTOLIN HFA) 108 (90 BASE) MCG/ACT inhaler Inhale 2 puffs into the lungs  every 6 (six) hours as needed for wheezing. 04/17/13  Yes Sandford CrazeMelissa O'Sullivan, NP  aspirin EC 81 MG tablet Take 81 mg by mouth daily.   Yes Historical Provider, MD  EPINEPHrine 0.3 mg/0.3 mL IJ SOAJ injection Inject 0.3 mg into the skin once.  05/19/14  Yes Historical Provider, MD  furosemide (LASIX) 40 MG tablet Take one tablet once daily every day.  OK to take afternoon dose if increased swelling Patient taking differently: Take 40 mg by mouth daily.  01/29/14  Yes Sandford CrazeMelissa O'Sullivan, NP  metFORMIN (GLUCOPHAGE-XR) 500 MG 24 hr tablet 2 tabs by mouth once daily. Patient taking differently: Take 1,000 mg by mouth daily.  11/25/12  Yes Sandford CrazeMelissa O'Sullivan, NP  nebivolol (BYSTOLIC) 5 MG tablet Take 1 tablet (5 mg total) by mouth daily. 01/29/14  Yes Sandford CrazeMelissa O'Sullivan, NP  omeprazole (PRILOSEC) 20 MG capsule Take 20 mg by mouth daily as needed (FOR ACID REFLUX).  11/25/12  Yes Sandford CrazeMelissa O'Sullivan, NP  potassium chloride (MICRO-K) 10 MEQ CR capsule Take 1 capsule (10 mEq total) by mouth daily. 01/29/14  Yes Sandford CrazeMelissa O'Sullivan, NP  pravastatin (PRAVACHOL) 20 MG tablet Take 20 mg by mouth daily. 08/30/14 08/30/15 Yes Historical Provider, MD  benzonatate (TESSALON) 100 MG capsule Take 1 capsule (100 mg total) by mouth every 8 (eight) hours. Patient not taking: Reported on 09/15/2014 05/10/14   Ladona MowJoe Mintz, PA-C  meloxicam (MOBIC) 7.5 MG tablet  Take 1 tablet (7.5 mg total) by mouth daily. Patient not taking: Reported on 09/15/2014 01/27/14   Sandford Craze, NP  metroNIDAZOLE (FLAGYL) 500 MG tablet Take 1 tablet (500 mg total) by mouth 2 (two) times daily. One po bid x 7 days Patient not taking: Reported on 09/15/2014 05/10/14   Ladona Mow, PA-C  predniSONE (DELTASONE) 20 MG tablet Take 2 tablets (40 mg total) by mouth daily. Patient not taking: Reported on 09/15/2014 05/10/14   Ladona Mow, PA-C    ALLERGIES:  Allergies  Allergen Reactions  . Sulfonamide Derivatives Swelling    SOCIAL HISTORY:  History  Substance Use  Topics  . Smoking status: Never Smoker   . Smokeless tobacco: Not on file  . Alcohol Use: Yes     Comment: occasional    FAMILY HISTORY: Family History  Problem Relation Age of Onset  . Coronary artery disease      female 1st degree relative <60/ female 1st degree <50  . Stroke      female 1st degree relatvie <50  . Allergies Sister     and daughter  . Allergy (severe) Sister   . Heart attack Sister   . Asthma Daughter   . Allergy (severe) Daughter   . Heart disease Mother   . Heart attack Mother   . Other Mother     rheumatism  . Heart disease Father   . Heart disease Paternal Grandfather   . Heart attack Paternal Grandfather   . Stroke Paternal Grandfather   . Cancer Paternal Grandfather     prostate  . Prostate cancer Maternal Grandfather   . Other Maternal Grandmother     rheumatism    EXAM: BP 133/79 mmHg  Pulse 67  Temp(Src) 98.1 F (36.7 C) (Oral)  Resp 16  Ht  (1.626 m)  Wt 309 lb (140.161 kg)  BMI 53.01 kg/m2  SpO2 100% CONSTITUTIONAL: Alert and oriented and responds appropriately to questions. Well-appearing; well-nourished HEAD: Normocephalic EYES: Conjunctivae clear, PERRL ENT: normal nose; no rhinorrhea; moist mucous membranes; pharynx without lesions noted NECK: Supple, no meningismus, no LAD  CARD: RRR; S1 and S2 appreciated; no murmurs, no clicks, no rubs, no gallops RESP: Normal chest excursion without splinting or tachypnea; breath sounds clear and equal bilaterally; no wheezes, no rhonchi, no rales, no hypoxia or respiratory distress, speaking full sentences ABD/GI: Normal bowel sounds; non-distended; soft, non-tender, no rebound, no guarding, no peritoneal signs GU:  Normal external genitalia, patient has thick white vaginal discharge without odor, no vaginal bleeding, no adnexal tenderness or shoulder motion tenderness BACK:  The back appears normal and is tender to palpation over the paraspinal musculature of the thoracic and lumbar spine  with no midline spinal tenderness or step-off or deformity, there is no CVA tenderness EXT: Normal ROM in all joints; non-tender to palpation; nonpitting bilateral edema in her feet and distal calf; normal capillary refill; no cyanosis, no calf tenderness or swelling    SKIN: Normal color for age and race; warm NEURO: Moves all extremities equally, sensation to light touch intact diffusely, cranial nerves II through XII intact, normal gait PSYCH: The patient's mood and manner are appropriate. Grooming and personal hygiene are appropriate.  MEDICAL DECISION MAKING: Patient here with multiple complaints. We'll obtain labs, chest x-ray for her shortness of breath, CT of her abdomen and pelvis to evaluate for kidney stone. We'll give Toradol for her pain. We'll obtain urine, pelvic cultures.  ED PROGRESS: Patient's workup has been largely unremarkable. Normal  labs clinic normal BMP and troponin. Urine shows no sign of infection or blood. Wet prep is negative except for few WBCs. Gonorrhea and Chlamydia cultures are pending but I do not feel she needs empiric treatment. Chest x-ray is clear. CT scan shows 4 mm nonobstructive calculus in the right kidney with no other stones. Normal appendix. I feel she is safe to be discharged. Have advised her to continue her Lasix. We'll discharge with prescription for ibuprofen to take as needed for pain. Discussed return precautions and importance of PCP follow-up. She verbalizes understanding and is comfortable with plan.      EKG Interpretation  Date/Time:  Wednesday September 15 2014 15:57:29 EDT Ventricular Rate:  54 PR Interval:  158 QRS Duration: 71 QT Interval:  423 QTC Calculation: 401 R Axis:   54 Text Interpretation:  Sinus rhythm No significant change since last tracing Confirmed by Iyari Hagner,  DO, Yamna Mackel [phone]) on 09/15/2014 4:53:03 PM        Layla Maw Tung Pustejovsky, DO 09/15/14 [id number]

## 2014-09-15 NOTE — ED Notes (Signed)
MD (Ward) at bedside. 

## 2014-09-15 NOTE — ED Notes (Signed)
Pt reports bilateral flank pain, and leg swelling for last several days. nad noted. Pt denies any n/v/d.

## 2014-09-15 NOTE — ED Notes (Signed)
Pelvic setup at bedside.

## 2014-09-16 LAB — GC/CHLAMYDIA PROBE AMP (~~LOC~~) NOT AT ARMC
CHLAMYDIA, DNA PROBE: NEGATIVE
Neisseria Gonorrhea: NEGATIVE

## 2014-11-14 ENCOUNTER — Other Ambulatory Visit: Payer: Self-pay | Admitting: Family Medicine

## 2014-11-19 ENCOUNTER — Other Ambulatory Visit: Payer: Self-pay | Admitting: Family Medicine

## 2014-12-01 ENCOUNTER — Emergency Department (HOSPITAL_COMMUNITY)
Admission: EM | Admit: 2014-12-01 | Discharge: 2014-12-01 | Disposition: A | Discharge status: Home / Self Care | Payer: BLUE CROSS/BLUE SHIELD | Attending: Emergency Medicine | Admitting: Emergency Medicine

## 2014-12-01 ENCOUNTER — Emergency Department (HOSPITAL_COMMUNITY): Payer: BLUE CROSS/BLUE SHIELD

## 2014-12-01 ENCOUNTER — Encounter (HOSPITAL_COMMUNITY): Payer: Self-pay

## 2014-12-01 DIAGNOSIS — Z79899 Other long term (current) drug therapy: Secondary | ICD-10-CM | POA: Insufficient documentation

## 2014-12-01 DIAGNOSIS — Z8719 Personal history of other diseases of the digestive system: Secondary | ICD-10-CM | POA: Diagnosis not present

## 2014-12-01 DIAGNOSIS — Z8619 Personal history of other infectious and parasitic diseases: Secondary | ICD-10-CM | POA: Diagnosis not present

## 2014-12-01 DIAGNOSIS — Z8669 Personal history of other diseases of the nervous system and sense organs: Secondary | ICD-10-CM | POA: Diagnosis not present

## 2014-12-01 DIAGNOSIS — R0602 Shortness of breath: Secondary | ICD-10-CM | POA: Diagnosis present

## 2014-12-01 DIAGNOSIS — Z86018 Personal history of other benign neoplasm: Secondary | ICD-10-CM | POA: Insufficient documentation

## 2014-12-01 DIAGNOSIS — I10 Essential (primary) hypertension: Secondary | ICD-10-CM | POA: Diagnosis not present

## 2014-12-01 DIAGNOSIS — E669 Obesity, unspecified: Secondary | ICD-10-CM | POA: Insufficient documentation

## 2014-12-01 DIAGNOSIS — J40 Bronchitis, not specified as acute or chronic: Secondary | ICD-10-CM | POA: Diagnosis not present

## 2014-12-01 DIAGNOSIS — Z7982 Long term (current) use of aspirin: Secondary | ICD-10-CM | POA: Insufficient documentation

## 2014-12-01 DIAGNOSIS — J3489 Other specified disorders of nose and nasal sinuses: Secondary | ICD-10-CM

## 2014-12-01 LAB — URINALYSIS, ROUTINE W REFLEX MICROSCOPIC
BILIRUBIN URINE: NEGATIVE
GLUCOSE, UA: NEGATIVE mg/dL
Hgb urine dipstick: NEGATIVE
Ketones, ur: NEGATIVE mg/dL
Leukocytes, UA: NEGATIVE
NITRITE: NEGATIVE
PH: 6 (ref 5.0–8.0)
Protein, ur: NEGATIVE mg/dL
SPECIFIC GRAVITY, URINE: 1.002 — AB (ref 1.005–1.030)
Urobilinogen, UA: 0.2 mg/dL (ref 0.0–1.0)

## 2014-12-01 LAB — CBC WITH DIFFERENTIAL/PLATELET
BASOS ABS: 0 10*3/uL (ref 0.0–0.1)
Basophils Relative: 0 % (ref 0–1)
Eosinophils Absolute: 0.1 10*3/uL (ref 0.0–0.7)
Eosinophils Relative: 1 % (ref 0–5)
HEMATOCRIT: 39.4 % (ref 36.0–46.0)
HEMOGLOBIN: 13 g/dL (ref 12.0–15.0)
LYMPHS PCT: 25 % (ref 12–46)
Lymphs Abs: 2.3 10*3/uL (ref 0.7–4.0)
MCH: 28 pg (ref 26.0–34.0)
MCHC: 33 g/dL (ref 30.0–36.0)
MCV: 84.9 fL (ref 78.0–100.0)
MONO ABS: 0.6 10*3/uL (ref 0.1–1.0)
Monocytes Relative: 7 % (ref 3–12)
NEUTROS ABS: 6.1 10*3/uL (ref 1.7–7.7)
NEUTROS PCT: 67 % (ref 43–77)
Platelets: 264 10*3/uL (ref 150–400)
RBC: 4.64 MIL/uL (ref 3.87–5.11)
RDW: 13.5 % (ref 11.5–15.5)
WBC: 9.1 10*3/uL (ref 4.0–10.5)

## 2014-12-01 LAB — BASIC METABOLIC PANEL
ANION GAP: 9 (ref 5–15)
BUN: 16 mg/dL (ref 6–20)
CALCIUM: 9.1 mg/dL (ref 8.9–10.3)
CHLORIDE: 105 mmol/L (ref 101–111)
CO2: 23 mmol/L (ref 22–32)
CREATININE: 0.99 mg/dL (ref 0.44–1.00)
GFR calc Af Amer: >60 mL/min (ref >60)
Glucose, Bld: 131 mg/dL — ABNORMAL HIGH (ref 65–99)
Potassium: 3.3 mmol/L — ABNORMAL LOW (ref 3.5–5.1)
Sodium: 137 mmol/L (ref 135–145)

## 2014-12-01 LAB — I-STAT TROPONIN, ED: TROPONIN I, POC: 0 ng/mL (ref 0.00–0.08)

## 2014-12-01 LAB — TSH: TSH: 2.412 u[IU]/mL (ref 0.350–4.500)

## 2014-12-01 MED ORDER — ALBUTEROL SULFATE HFA 108 (90 BASE) MCG/ACT IN AERS: 2.0000 | INHALATION_SPRAY | RESPIRATORY_TRACT | Status: DC | PRN

## 2014-12-01 MED ORDER — FLUTICASONE PROPIONATE 50 MCG/ACT NA SUSP: 2.0000 | Freq: Every day | NASAL | Status: AC

## 2014-12-01 MED ORDER — IPRATROPIUM-ALBUTEROL 0.5-2.5 (3) MG/3ML IN SOLN
3.0000 mL | Freq: Once | RESPIRATORY_TRACT | Status: AC
Start: 2014-12-01 — End: 2014-12-01
  Administered 2014-12-01: 3 mL via RESPIRATORY_TRACT
  Filled 2014-12-01: qty 3

## 2014-12-01 MED ORDER — POTASSIUM CHLORIDE CRYS ER 20 MEQ PO TBCR
40.0000 meq | EXTENDED_RELEASE_TABLET | Freq: Once | ORAL | Status: AC
  Administered 2014-12-01: 40 meq via ORAL
  Filled 2014-12-01: qty 2

## 2014-12-01 NOTE — ED Provider Notes (Signed)
TIME SEEN: 4:25 AM  CHIEF COMPLAINT: Sinus drainage, wheezing, shortness of breath  HPI: Pt is a 49 y.o. female with history of hypertension, obesity, obstructive sleep apnea, chronic bronchitis who presents to the emergency department with complaints of 3 weeks of sore throat, drainage and her posterior oropharynx, dry cough and shortness of breath. States it she is staying has an issue with mold and she states she cleaned an area with Clorox tonight and felt like her throat was swelling and she could not breathe. Also states that she feels like she is dehydrated but states she has been eating and drinking normally. This is contrary to nursing notes. No vomiting or diarrhea. Also is requesting that we check her thyroid today she feels like it is enlarged.  States she is also having pressure when she urinates for the past several weeks. No chest pain. No fever. No lower extremity swelling or pain.  ROS: See HPI Constitutional: no fever  Eyes: no drainage  ENT: no runny nose   Cardiovascular:  no chest pain  Resp:  SOB  GI: no vomiting GU: no dysuria Integumentary: no rash  Allergy: no hives  Musculoskeletal: no leg swelling  Neurological: no slurred speech ROS otherwise negative  PAST MEDICAL HISTORY/PAST SURGICAL HISTORY:  Past Medical History  Diagnosis Date  . Palpitation     event monito 2/10  . Chest pain     negative Myoview march 2009  . HTN (hypertension)     controlled  . OSA (obstructive sleep apnea)     mild  . Obesity   . Lipoma     Hx-left leg. s/p excision  . Rectal bleeding 5/10    normal colonoscopy. Dr. Arlyce Dice  . Yeast infection     MEDICATIONS:  Prior to Admission medications   Medication Sig Start Date End Date Taking? Authorizing Provider  albuterol (PROVENTIL HFA;VENTOLIN HFA) 108 (90 BASE) MCG/ACT inhaler Inhale 2 puffs into the lungs every 6 (six) hours as needed for wheezing. 04/17/13   Sandford Craze, NP  aspirin EC 81 MG tablet Take 81 mg by  mouth daily.    Historical Provider, MD  benzonatate (TESSALON) 100 MG capsule Take 1 capsule (100 mg total) by mouth every 8 (eight) hours. Patient not taking: Reported on 09/15/2014 05/10/14   Ladona Mow, PA-C  EPINEPHrine 0.3 mg/0.3 mL IJ SOAJ injection Inject 0.3 mg into the skin once.  05/19/14   Historical Provider, MD  furosemide (LASIX) 40 MG tablet Take one tablet once daily every day.  OK to take afternoon dose if increased swelling Patient taking differently: Take 40 mg by mouth daily.  01/29/14   Sandford Craze, NP  furosemide (LASIX) 40 MG tablet Take 1 tablet (40 mg total) by mouth daily. 09/15/14   Nejla Reasor N Jersey Espinoza, DO  ibuprofen (ADVIL,MOTRIN) 800 MG tablet Take 1 tablet (800 mg total) by mouth every 8 (eight) hours as needed for mild pain. 09/15/14   Calley Drenning N Elisabet Gutzmer, DO  meloxicam (MOBIC) 7.5 MG tablet Take 1 tablet (7.5 mg total) by mouth daily. Patient not taking: Reported on 09/15/2014 01/27/14   Sandford Craze, NP  metFORMIN (GLUCOPHAGE-XR) 500 MG 24 hr tablet 2 tabs by mouth once daily. Patient taking differently: Take 1,000 mg by mouth daily.  11/25/12   Sandford Craze, NP  metroNIDAZOLE (FLAGYL) 500 MG tablet Take 1 tablet (500 mg total) by mouth 2 (two) times daily. One po bid x 7 days Patient not taking: Reported on 09/15/2014 05/10/14   Gabriel Rung  Rexanne Mano, PA-C  nebivolol (BYSTOLIC) 5 MG tablet Take 1 tablet (5 mg total) by mouth daily. 01/29/14   Sandford Craze, NP  omeprazole (PRILOSEC) 20 MG capsule Take 20 mg by mouth daily as needed (FOR ACID REFLUX).  11/25/12   Sandford Craze, NP  potassium chloride (MICRO-K) 10 MEQ CR capsule Take 1 capsule (10 mEq total) by mouth daily. 01/29/14   Sandford Craze, NP  pravastatin (PRAVACHOL) 20 MG tablet Take 20 mg by mouth daily. 08/30/14 08/30/15  Historical Provider, MD  predniSONE (DELTASONE) 20 MG tablet Take 2 tablets (40 mg total) by mouth daily. Patient not taking: Reported on 09/15/2014 05/10/14   Ladona Mow, PA-C    ALLERGIES:   Allergies  Allergen Reactions  . Sulfonamide Derivatives Swelling    SOCIAL HISTORY:  Social History  Substance Use Topics  . Smoking status: Never Smoker   . Smokeless tobacco: Not on file  . Alcohol Use: Yes     Comment: occasional    FAMILY HISTORY: Family History  Problem Relation Age of Onset  . Coronary artery disease      female 1st degree relative <60/ female 1st degree <50  . Stroke      female 1st degree relatvie <50  . Allergies Sister     and daughter  . Allergy (severe) Sister   . Heart attack Sister   . Asthma Daughter   . Allergy (severe) Daughter   . Heart disease Mother   . Heart attack Mother   . Other Mother     rheumatism  . Heart disease Father   . Heart disease Paternal Grandfather   . Heart attack Paternal Grandfather   . Stroke Paternal Grandfather   . Cancer Paternal Grandfather     prostate  . Prostate cancer Maternal Grandfather   . Other Maternal Grandmother     rheumatism    EXAM: BP 149/81 mmHg  Pulse 76  Temp(Src) 98.3 F (36.8 C) (Oral)  Resp 20  SpO2 100% CONSTITUTIONAL: Alert and oriented and responds appropriately to questions. Well-appearing; well-nourished HEAD: Normocephalic EYES: Conjunctivae clear, PERRL ENT: normal nose; no rhinorrhea; moist mucous membranes; pharynx without lesions noted, no tonsillar hypertrophy or exudate, no pharyngeal erythema, no uvular deviation, no trismus or drooling, normal phonation, no stridor, small amount of clear/yellow sinus drainage noted in the posterior oropharynx NECK: Supple, no meningismus, no LAD, no thyromegaly appreciated the exam is limited given patient's obesity, no JVD  CARD: RRR; S1 and S2 appreciated; no murmurs, no clicks, no rubs, no gallops RESP: Normal chest excursion without splinting or tachypnea; breath sounds clear and equal bilaterally; no wheezes, no rhonchi, no rales, no hypoxia or respiratory distress, speaking full sentences ABD/GI: Normal bowel sounds;  non-distended; soft, non-tender, no rebound, no guarding, no peritoneal signs BACK:  The back appears normal and is non-tender to palpation, there is no CVA tenderness EXT: Normal ROM in all joints; non-tender to palpation; no edema; normal capillary refill; no cyanosis, no calf tenderness or swelling    SKIN: Normal color for age and race; warm NEURO: Moves all extremities equally, sensation to light touch intact diffusely, cranial nerves II through XII intact PSYCH: The patient's mood and manner are appropriate. Grooming and personal hygiene are appropriate.  MEDICAL DECISION MAKING: Patient here with multiple complaints. She states that she feels the mold in her house and Clorox that she use tonight have exacerbated her chronic bronchitis. Her lungs are clear and she is speaking full sentences.  Normal phonation. Will  obtain chest x-ray. We'll also obtain EKG and troponin given she is complaining of shortness of breath although this has been present for 3 weeks. No chest pain. No history of PE or DVT. She is not tachycardic, tachypneic or hypoxic. We'll also obtain urinalysis given complaints of dysuria. She is status post hysterectomy. Abdominal exam is benign. She appears well hydrated on exam. She is also requesting that we check her thyroid. No signs of thyromegaly.  ED PROGRESS: Patient's labs are unremarkable. Troponin negative. Chest x-ray clear. Urine shows no sign of infection. Given 1 DuoNeb treatment for symptomatic control patient reports feeling better. We'll discharge him with albuterol and Flonase. Have recommended close outpatient follow-up. I do not feel she needs further emergent workup at this time. Discussed usual and customary return precautions. She verbalized understanding and is comfortable with this plan.     EKG Interpretation  Date/Time:  Wednesday December 01 2014 04:18:30 EDT Ventricular Rate:  78 PR Interval:  169 QRS Duration: 66 QT Interval:  381 QTC  Calculation: 434 R Axis:   51 Text Interpretation:  Sinus rhythm Probable left atrial enlargement Low voltage, precordial leads No significant change since last tracing Confirmed by Amijah Timothy,  DO, Latrisha Coiro [phone]) on 12/01/2014 4:30:08 AM          Layla Maw Dhanvin Szeto, DO 12/01/14 [id number]

## 2014-12-01 NOTE — ED Notes (Signed)
Pt comes from home, states she has had increased SOB, pt states where she was staying had a mold issue and was cleaned with clorox with makes her feel as if her throat is swelling. Pt also states she has been in the heat and feels dehydrated, reports not drinking a lot of water.

## 2014-12-01 NOTE — Discharge Instructions (Signed)

## 2015-03-14 ENCOUNTER — Encounter: Payer: Self-pay | Admitting: Gastroenterology

## 2016-02-02 IMAGING — CR DG CHEST 2V
2 series · 2 of 2 positions shown · non-contrast
Comparison: 06/17/2008

CLINICAL DATA: Initial encounter for chest pain associated with
shortness of Breath and indigestion.

EXAM:
CHEST  2 VIEW

[chest pa]
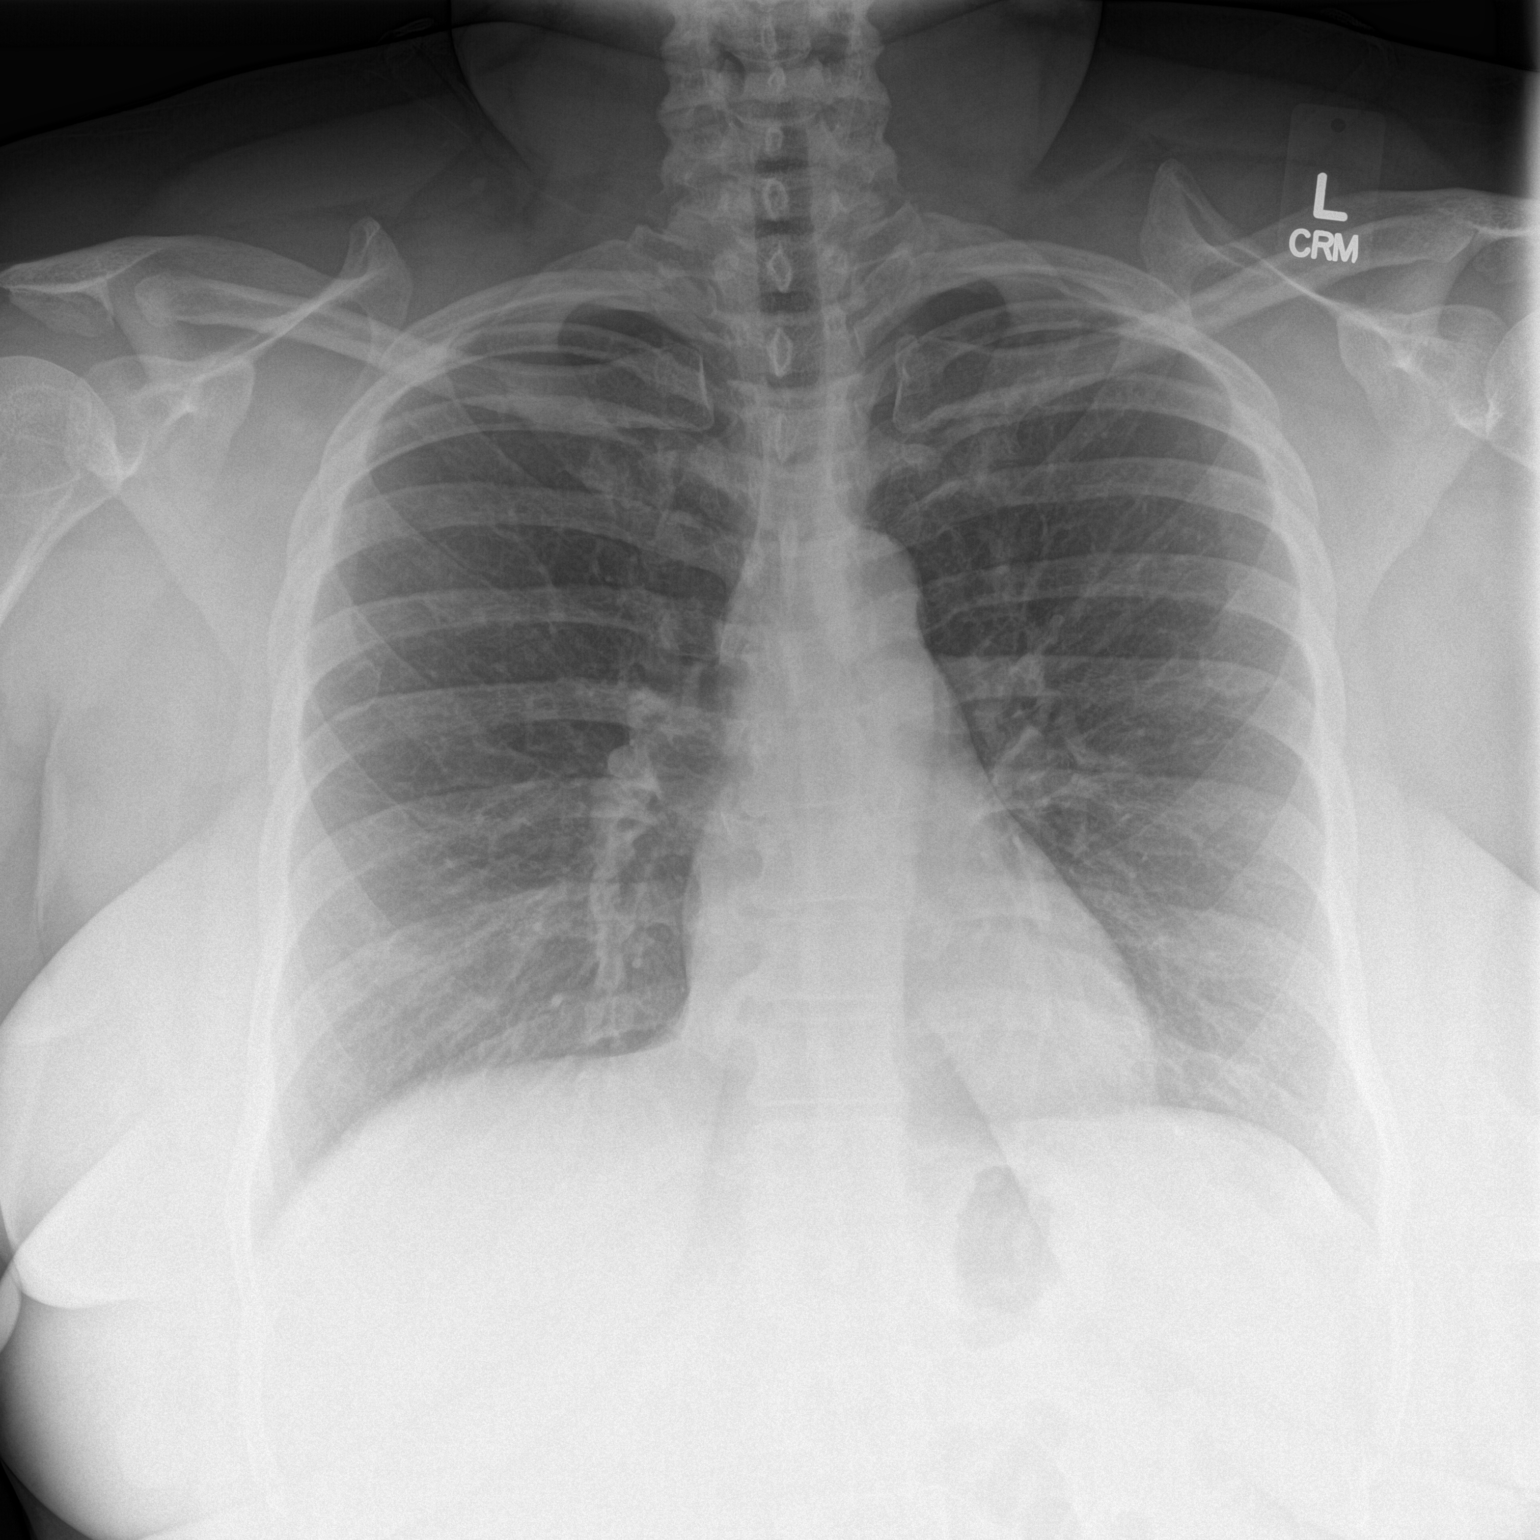

[chest lat]
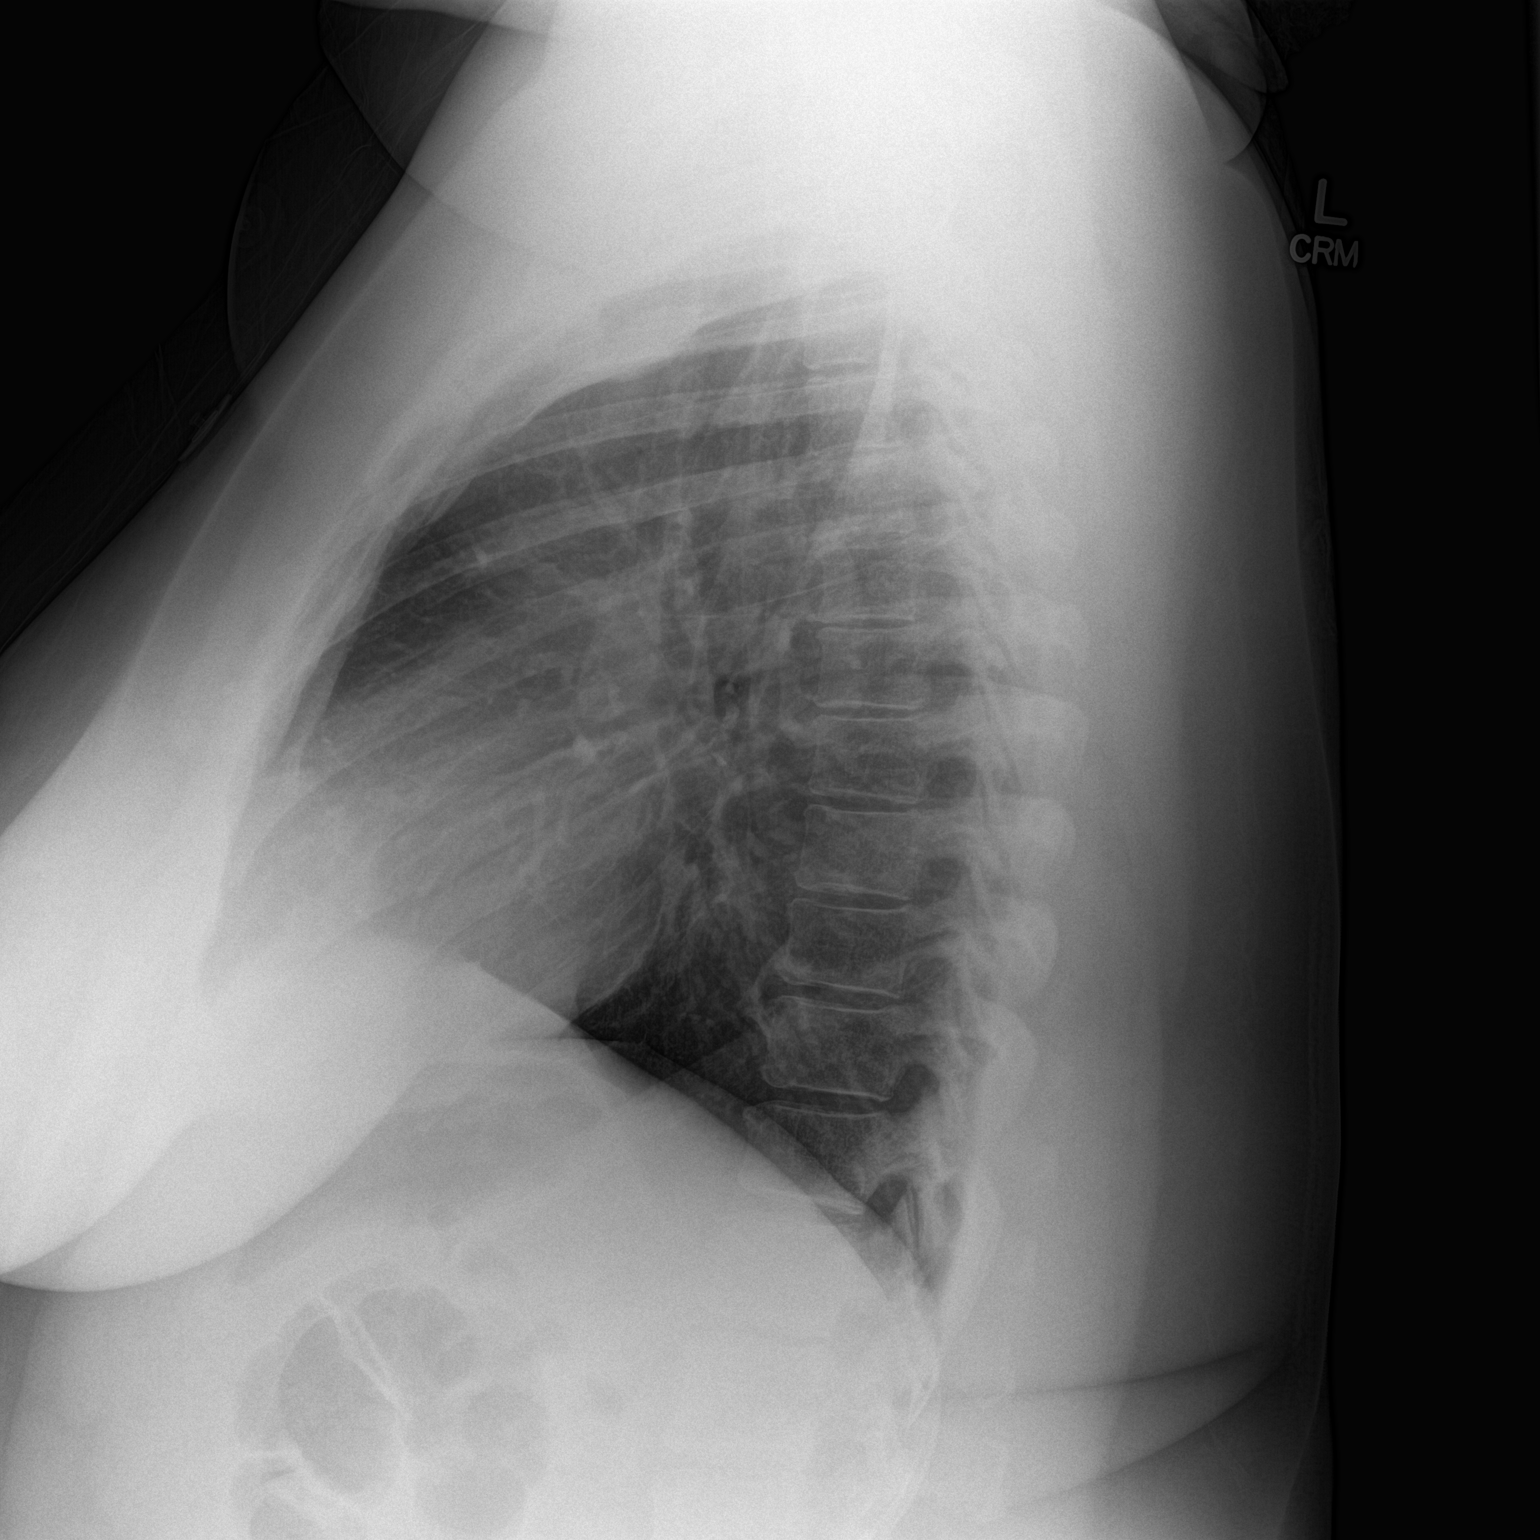

[2 of 2 positions shown; findings below may reference images not displayed]

FINDINGS: Midline trachea. Normal heart size and mediastinal contours. No
pleural effusion or pneumothorax. Clear lungs.
IMPRESSION: Normal chest.

## 2016-05-16 ENCOUNTER — Emergency Department
Admission: EM | Admit: 2016-05-16 | Discharge: 2016-05-16 | Disposition: A | Discharge status: Home / Self Care | Payer: BLUE CROSS/BLUE SHIELD | Attending: Student in an Organized Health Care Education/Training Program | Admitting: Student in an Organized Health Care Education/Training Program

## 2016-05-16 ENCOUNTER — Encounter: Payer: Self-pay | Admitting: *Deleted

## 2016-05-16 ENCOUNTER — Emergency Department: Payer: BLUE CROSS/BLUE SHIELD

## 2016-05-16 DIAGNOSIS — F17200 Nicotine dependence, unspecified, uncomplicated: Secondary | ICD-10-CM | POA: Insufficient documentation

## 2016-05-16 DIAGNOSIS — J209 Acute bronchitis, unspecified: Secondary | ICD-10-CM

## 2016-05-16 DIAGNOSIS — Z7984 Long term (current) use of oral hypoglycemic drugs: Secondary | ICD-10-CM | POA: Insufficient documentation

## 2016-05-16 DIAGNOSIS — R05 Cough: Secondary | ICD-10-CM | POA: Diagnosis present

## 2016-05-16 DIAGNOSIS — E119 Type 2 diabetes mellitus without complications: Secondary | ICD-10-CM | POA: Insufficient documentation

## 2016-05-16 DIAGNOSIS — J111 Influenza due to unidentified influenza virus with other respiratory manifestations: Secondary | ICD-10-CM

## 2016-05-16 DIAGNOSIS — R69 Illness, unspecified: Secondary | ICD-10-CM

## 2016-05-16 DIAGNOSIS — Z7982 Long term (current) use of aspirin: Secondary | ICD-10-CM | POA: Diagnosis not present

## 2016-05-16 DIAGNOSIS — I10 Essential (primary) hypertension: Secondary | ICD-10-CM | POA: Insufficient documentation

## 2016-05-16 MED ORDER — AZITHROMYCIN 250 MG PO TABS: ORAL_TABLET | ORAL | 0 refills | Status: DC

## 2016-05-16 MED ORDER — PROMETHAZINE-DM 6.25-15 MG/5ML PO SYRP: 5.0000 mL | ORAL_SOLUTION | Freq: Four times a day (QID) | ORAL | 0 refills | Status: DC | PRN

## 2016-05-16 MED ORDER — PREDNISONE 10 MG PO TABS: ORAL_TABLET | ORAL | 0 refills | Status: DC

## 2016-05-16 MED ORDER — OSELTAMIVIR PHOSPHATE 75 MG PO CAPS: 75.0000 mg | ORAL_CAPSULE | Freq: Two times a day (BID) | ORAL | 0 refills | Status: DC

## 2016-05-16 NOTE — ED Notes (Signed)
Pt reports cough, chills, fever, nasal congestions and sore throat since Sunday. Pt is noted to be dyspneic on exertion at this time.

## 2016-05-16 NOTE — ED Triage Notes (Signed)
States flu like symptoms since Sunday, states sore throat

## 2016-05-16 NOTE — ED Provider Notes (Signed)
[facility] Emergency Department Provider Note  ____________________________________________  Time seen: Approximately 2:25 PM  I have reviewed the triage vital signs and the nursing notes.   HISTORY  Chief Complaint Cough and Fever    HPI [name] is a 51 y.o. female , NAD, presents to the emergency department with 2 day history of cough, chest congestion and body aches. Patient states over the last couple of days she's had onset of fevers, chills, body aches, cough and chest congestion. States that over the last day her cough has increased and has become productive of thick mucus and can cause shortness of breath due to cough. Patient states the cough is painful and can cause headache at times. Has had nasal congestion, runny nose and ear pressure. Denies any sore throat, shortness of breath or wheezing. Has had no abdominal pain, nausea, vomiting or diarrhea. States she has a history of bronchitis and the symptoms feel similar. No known sick contacts. Has been taking over-the-counter medication without significant alleviation of symptoms.   Past Medical History:  Diagnosis Date  . Chest pain    negative Myoview march 2009  . HTN (hypertension)    controlled  . Lipoma    Hx-left leg. s/p excision  . Obesity   . OSA (obstructive sleep apnea)    mild  . Palpitation    event monito 2/10  . Rectal bleeding 5/10   normal colonoscopy. Dr. Arlyce Dice  . Yeast infection     Patient Active Problem List   Diagnosis Date Noted  . Viral URI 01/27/2014  . Hip pain 01/27/2014  . Vaginitis and vulvovaginitis 01/27/2014  . Other dysphagia 11/09/2013  . Thyroid nodule 11/09/2013  . Rhinitis 10/26/2013  . Nasal lesion 04/16/2013  . Routine general medical examination at a health care facility 08/25/2012  . DIABETES MELLITUS, TYPE II 07/19/2008  . HYPERLIPIDEMIA 07/19/2008  . ALLERGIC RHINITIS 07/19/2008  . GERD 07/19/2008  . PALPITATIONS 07/07/2008  .  OBSTRUCTIVE SLEEP APNEA 06/14/2008  . OBESITY, MORBID 05/25/2008  . Essential hypertension 05/25/2008  . CHEST PAIN, ATYPICAL 05/25/2008    Past Surgical History:  Procedure Laterality Date  . ABDOMINAL HYSTERECTOMY    . hysterectomy unspecified area      Prior to Admission medications   Medication Sig Start Date End Date Taking? Authorizing Provider  albuterol (PROVENTIL HFA;VENTOLIN HFA) 108 (90 BASE) MCG/ACT inhaler Inhale 2 puffs into the lungs every 6 (six) hours as needed for wheezing. 04/17/13   Sandford Craze, NP  albuterol (PROVENTIL HFA;VENTOLIN HFA) 108 (90 BASE) MCG/ACT inhaler Inhale 2 puffs into the lungs every 4 (four) hours as needed for wheezing or shortness of breath. 12/01/14   Kristen N Ward, DO  aspirin EC 81 MG tablet Take 81 mg by mouth daily.    Historical Provider, MD  azithromycin (ZITHROMAX Z-PAK) 250 MG tablet Take 2 tablets (500 mg) on  Day 1,  followed by 1 tablet (250 mg) once daily on Days 2 through 5. 05/16/16   Jami L Hagler, PA-C  EPINEPHrine 0.3 mg/0.3 mL IJ SOAJ injection Inject 0.3 mg into the skin daily as needed (allergies).  05/19/14   Historical Provider, MD  fluticasone (FLONASE) 50 MCG/ACT nasal spray Place 2 sprays into both nostrils daily. 12/01/14   Kristen N Ward, DO  furosemide (LASIX) 40 MG tablet Take 1 tablet (40 mg total) by mouth daily. 09/15/14   Kristen N Ward, DO  ibuprofen (ADVIL,MOTRIN) 800 MG tablet Take 1 tablet (800 mg  total) by mouth every 8 (eight) hours as needed for mild pain. 09/15/14   Kristen N Ward, DO  metFORMIN (GLUCOPHAGE-XR) 500 MG 24 hr tablet 2 tabs by mouth once daily. Patient taking differently: Take 1,000 mg by mouth daily.  11/25/12   Sandford Craze, NP  nebivolol (BYSTOLIC) 5 MG tablet Take 1 tablet (5 mg total) by mouth daily. 01/29/14   Sandford Craze, NP  omeprazole (PRILOSEC) 20 MG capsule Take 20 mg by mouth daily as needed (FOR ACID REFLUX).  11/25/12   Sandford Craze, NP  oseltamivir (TAMIFLU) 75 MG  capsule Take 1 capsule (75 mg total) by mouth 2 (two) times daily. 05/16/16   Jami L Hagler, PA-C  potassium chloride (MICRO-K) 10 MEQ CR capsule Take 1 capsule (10 mEq total) by mouth daily. 01/29/14   Sandford Craze, NP  predniSONE (DELTASONE) 10 MG tablet Take a daily regimen of 6,5,4,3,2,1 05/16/16   Jami L Hagler, PA-C  promethazine-dextromethorphan (PROMETHAZINE-DM) 6.25-15 MG/5ML syrup Take 5 mLs by mouth 4 (four) times daily as needed for cough. 05/16/16   Jami L Hagler, PA-C    Allergies Sulfonamide derivatives  Family History  Problem Relation Age of Onset  . Coronary artery disease      female 1st degree relative <60/ female 1st degree <50  . Stroke      female 1st degree relatvie <50  . Allergies Sister     and daughter  . Allergy (severe) Sister   . Heart attack Sister   . Asthma Daughter   . Allergy (severe) Daughter   . Heart disease Mother   . Heart attack Mother   . Other Mother     rheumatism  . Heart disease Father   . Heart disease Paternal Grandfather   . Heart attack Paternal Grandfather   . Stroke Paternal Grandfather   . Cancer Paternal Grandfather     prostate  . Prostate cancer Maternal Grandfather   . Other Maternal Grandmother     rheumatism    Social History Social History  Substance Use Topics  . Smoking status: Current Some Day Smoker  . Smokeless tobacco: Not on file  . Alcohol use Yes     Comment: occasional     Review of Systems  Constitutional: Positive subjective fevers, chills. No decreased appetite Eyes: No visual changes.  ENT: Positive nasal congestion, runny nose. No sore throat. Cardiovascular: No chest pain. Respiratory: Positive cough, chest congestion and shortness of breath.  No wheezing.  Gastrointestinal: No abdominal pain.  No nausea, vomiting.  No diarrhea.   Musculoskeletal: Positive for general myalgias.  Skin: Negative for rash. Neurological: Negative for headaches, focal weakness or numbness. 10-point ROS  otherwise negative.  ____________________________________________   PHYSICAL EXAM:  VITAL SIGNS: ED Triage Vitals  Enc Vitals Group     BP 05/16/16 1255 (!) 138/96     Pulse Rate 05/16/16 1255 72     Resp 05/16/16 1255 18     Temp 05/16/16 1255 98.4 F (36.9 C)     Temp Source 05/16/16 1255 Oral     SpO2 05/16/16 1255 100 %     Weight 05/16/16 1255 300 lb (136.1 kg)     Height 05/16/16 1255 5\' 4"  (1.626 m)     Head Circumference --      Peak Flow --      Pain Score 05/16/16 1254 8     Pain Loc --      Pain Edu? --  Excl. in GC? --      Constitutional: Alert and oriented. Well appearing and in no acute distress. Eyes: Conjunctivae are normal Without icterus, injection or discharge. Head: Atraumatic. ENT:      Ears: TMs visualized bilaterally with mild bulging but no effusion, erythema, perforation.      Nose: No congestion with clear rhinorrhea.      Mouth/Throat: Mucous membranes are moist. Pharynx without erythema, swelling, exudate. Uvula is midline. Airway is patent. Clear postnasal drainage. Neck: No stridor. No carotid bruits. Supple with full range of motion. Hematological/Lymphatic/Immunilogical: No cervical lymphadenopathy. Cardiovascular: Normal rate, regular rhythm. Normal S1 and S2.  Good peripheral circulation. Respiratory: Normal respiratory effort without tachypnea or retractions. Lungs with coarse breath sounds throughout bilateral lung fields but no wheezes, rhonchi or rales. Neurologic:  Normal speech and language. No gross focal neurologic deficits are appreciated.  Skin:  Skin is warm, dry and intact. No rash noted. Psychiatric: Mood and affect are normal. Speech and behavior are normal. Patient exhibits appropriate insight and judgement.   ____________________________________________   LABS  None ____________________________________________  EKG  None ____________________________________________  RADIOLOGY I, Hope PigeonJami L Hagler, personally  viewed and evaluated these images (plain radiographs) as part of my medical decision making, as well as reviewing the written report by the radiologist.  Dg Chest 2 View  Result Date: 05/16/2016 CLINICAL DATA:  Cough and chest congestion.  Fever and chills. EXAM: CHEST  2 VIEW COMPARISON:  12/01/2014 FINDINGS: There is slight peribronchial thickening on the lateral view. The lungs are otherwise clear. Heart size and vascularity are normal. No bone abnormality. IMPRESSION: Slight bronchitic changes. Electronically Signed   By: Francene BoyersJames  Maxwell M.D.   On: 05/16/2016 14:52    ____________________________________________    PROCEDURES  Procedure(s) performed: None   Procedures   Medications - No data to display   ____________________________________________   INITIAL IMPRESSION / ASSESSMENT AND PLAN / ED COURSE  Pertinent labs & imaging results that were available during my care of the patient were reviewed by me and considered in my medical decision making (see chart for details).     Patient's diagnosis is consistent with acute bronchitis and influenza-like illness. Patient will be discharged home with prescriptions for Tamiflu, azithromycin, prednisone and promethazine DM to take as directed. May continue over-the-counter Tylenol or ibuprofen as needed. Patient [facility] hours if symptoms persist past this treatment course. Patient is given ED precautions to return to the ED for any worsening or new symptoms.   ____________________________________________  FINAL CLINICAL IMPRESSION(S) / ED DIAGNOSES  Final diagnoses:  Acute bronchitis, unspecified organism  Influenza-like illness      NEW MEDICATIONS STARTED DURING THIS VISIT:  Discharge Medication List as of 05/16/2016  3:11 PM    START taking these medications   Details  azithromycin (ZITHROMAX Z-PAK) 250 MG tablet Take 2 tablets (500 mg) on  Day 1,   followed by 1 tablet (250 mg) once daily on Days 2 through 5., Print    oseltamivir (TAMIFLU) 75 MG capsule Take 1 capsule (75 mg total) by mouth 2 (two) times daily., Starting Wed 05/16/2016, Print    predniSONE (DELTASONE) 10 MG tablet Take a daily regimen of 6,5,4,3,2,1, Print    promethazine-dextromethorphan (PROMETHAZINE-DM) 6.25-15 MG/5ML syrup Take 5 mLs by mouth 4 (four) times daily as needed for cough., Starting Wed 05/16/2016, Print  Hope Pigeon, PA-C 05/16/16 1728    Willy Eddy, MD 05/25/16 1459

## 2016-09-03 ENCOUNTER — Observation Stay
Admission: EM | Admit: 2016-09-03 | Discharge: 2016-09-04 | Disposition: A | Discharge status: Home / Self Care | Payer: BLUE CROSS/BLUE SHIELD | Attending: Internal Medicine | Admitting: Internal Medicine

## 2016-09-03 ENCOUNTER — Emergency Department: Payer: BLUE CROSS/BLUE SHIELD

## 2016-09-03 ENCOUNTER — Encounter: Payer: Self-pay | Admitting: Emergency Medicine

## 2016-09-03 DIAGNOSIS — Z79899 Other long term (current) drug therapy: Secondary | ICD-10-CM | POA: Insufficient documentation

## 2016-09-03 DIAGNOSIS — Z7984 Long term (current) use of oral hypoglycemic drugs: Secondary | ICD-10-CM | POA: Insufficient documentation

## 2016-09-03 DIAGNOSIS — J42 Unspecified chronic bronchitis: Secondary | ICD-10-CM | POA: Insufficient documentation

## 2016-09-03 DIAGNOSIS — J309 Allergic rhinitis, unspecified: Secondary | ICD-10-CM | POA: Diagnosis not present

## 2016-09-03 DIAGNOSIS — R748 Abnormal levels of other serum enzymes: Secondary | ICD-10-CM | POA: Insufficient documentation

## 2016-09-03 DIAGNOSIS — I10 Essential (primary) hypertension: Secondary | ICD-10-CM | POA: Diagnosis not present

## 2016-09-03 DIAGNOSIS — E119 Type 2 diabetes mellitus without complications: Secondary | ICD-10-CM | POA: Diagnosis not present

## 2016-09-03 DIAGNOSIS — R079 Chest pain, unspecified: Principal | ICD-10-CM | POA: Diagnosis present

## 2016-09-03 DIAGNOSIS — G4733 Obstructive sleep apnea (adult) (pediatric): Secondary | ICD-10-CM | POA: Diagnosis not present

## 2016-09-03 DIAGNOSIS — R778 Other specified abnormalities of plasma proteins: Secondary | ICD-10-CM

## 2016-09-03 DIAGNOSIS — K219 Gastro-esophageal reflux disease without esophagitis: Secondary | ICD-10-CM | POA: Diagnosis not present

## 2016-09-03 DIAGNOSIS — R600 Localized edema: Secondary | ICD-10-CM | POA: Insufficient documentation

## 2016-09-03 DIAGNOSIS — Z7982 Long term (current) use of aspirin: Secondary | ICD-10-CM | POA: Insufficient documentation

## 2016-09-03 DIAGNOSIS — Z7722 Contact with and (suspected) exposure to environmental tobacco smoke (acute) (chronic): Secondary | ICD-10-CM | POA: Insufficient documentation

## 2016-09-03 DIAGNOSIS — Z6841 Body Mass Index (BMI) 40.0 and over, adult: Secondary | ICD-10-CM | POA: Insufficient documentation

## 2016-09-03 DIAGNOSIS — J209 Acute bronchitis, unspecified: Secondary | ICD-10-CM | POA: Insufficient documentation

## 2016-09-03 DIAGNOSIS — F17200 Nicotine dependence, unspecified, uncomplicated: Secondary | ICD-10-CM | POA: Diagnosis not present

## 2016-09-03 DIAGNOSIS — Z8249 Family history of ischemic heart disease and other diseases of the circulatory system: Secondary | ICD-10-CM | POA: Diagnosis not present

## 2016-09-03 DIAGNOSIS — R7989 Other specified abnormal findings of blood chemistry: Secondary | ICD-10-CM

## 2016-09-03 DIAGNOSIS — Z7951 Long term (current) use of inhaled steroids: Secondary | ICD-10-CM | POA: Diagnosis not present

## 2016-09-03 LAB — BASIC METABOLIC PANEL
ANION GAP: 6 (ref 5–15)
BUN: 11 mg/dL (ref 6–20)
CALCIUM: 8.8 mg/dL — AB (ref 8.9–10.3)
CO2: 25 mmol/L (ref 22–32)
CREATININE: 0.73 mg/dL (ref 0.44–1.00)
Chloride: 105 mmol/L (ref 101–111)
Glucose, Bld: 127 mg/dL — ABNORMAL HIGH (ref 65–99)
Potassium: 3.6 mmol/L (ref 3.5–5.1)
Sodium: 136 mmol/L (ref 135–145)

## 2016-09-03 LAB — TROPONIN I
TROPONIN I: 0.04 ng/mL — AB (ref <0.03)
Troponin I: <0.03 ng/mL (ref <0.03)

## 2016-09-03 LAB — CBC
HCT: 39.6 % (ref 35.0–47.0)
HEMOGLOBIN: 12.9 g/dL (ref 12.0–16.0)
MCH: 26.6 pg (ref 26.0–34.0)
MCHC: 32.5 g/dL (ref 32.0–36.0)
MCV: 81.8 fL (ref 80.0–100.0)
PLATELETS: 233 10*3/uL (ref 150–440)
RBC: 4.84 MIL/uL (ref 3.80–5.20)
RDW: 13.5 % (ref 11.5–14.5)
WBC: 11 10*3/uL (ref 3.6–11.0)

## 2016-09-03 LAB — GLUCOSE, CAPILLARY
Glucose-Capillary: 91 mg/dL (ref 65–99)
Glucose-Capillary: 143 mg/dL — ABNORMAL HIGH (ref 65–99)

## 2016-09-03 MED ORDER — FLUTICASONE PROPIONATE 50 MCG/ACT NA SUSP
2.0000 | Freq: Every day | NASAL | Status: DC
  Administered 2016-09-04: 2 via NASAL
  Filled 2016-09-03: qty 16

## 2016-09-03 MED ORDER — ENOXAPARIN SODIUM 40 MG/0.4ML ~~LOC~~ SOLN
40.0000 mg | Freq: Two times a day (BID) | SUBCUTANEOUS | Status: DC
  Administered 2016-09-03 – 2016-09-04 (×2): 40 mg via SUBCUTANEOUS
  Filled 2016-09-03 (×2): qty 0.4

## 2016-09-03 MED ORDER — NEBIVOLOL HCL 5 MG PO TABS
5.0000 mg | ORAL_TABLET | Freq: Every day | ORAL | Status: DC
  Administered 2016-09-04: 5 mg via ORAL
  Filled 2016-09-03 (×2): qty 1

## 2016-09-03 MED ORDER — ASPIRIN EC 81 MG PO TBEC
81.0000 mg | DELAYED_RELEASE_TABLET | Freq: Every day | ORAL | Status: DC
  Administered 2016-09-04: 81 mg via ORAL
  Filled 2016-09-03: qty 1

## 2016-09-03 MED ORDER — ALBUTEROL SULFATE (2.5 MG/3ML) 0.083% IN NEBU
2.5000 mg | INHALATION_SOLUTION | RESPIRATORY_TRACT | Status: DC | PRN
  Administered 2016-09-03 – 2016-09-04 (×2): 2.5 mg via RESPIRATORY_TRACT
  Filled 2016-09-03 (×2): qty 3

## 2016-09-03 MED ORDER — INSULIN ASPART 100 UNIT/ML ~~LOC~~ SOLN
0.0000 [IU] | Freq: Three times a day (TID) | SUBCUTANEOUS | Status: DC
  Administered 2016-09-04: 1 [IU] via SUBCUTANEOUS
  Administered 2016-09-04: 2 [IU] via SUBCUTANEOUS
  Filled 2016-09-03: qty 2
  Filled 2016-09-03: qty 1

## 2016-09-03 MED ORDER — ACETAMINOPHEN 325 MG PO TABS
650.0000 mg | ORAL_TABLET | Freq: Four times a day (QID) | ORAL | Status: DC | PRN
  Administered 2016-09-03 – 2016-09-04 (×3): 650 mg via ORAL
  Filled 2016-09-03 (×4): qty 2

## 2016-09-03 MED ORDER — PANTOPRAZOLE SODIUM 40 MG PO TBEC
40.0000 mg | DELAYED_RELEASE_TABLET | Freq: Every day | ORAL | Status: DC
  Administered 2016-09-03 – 2016-09-04 (×2): 40 mg via ORAL
  Filled 2016-09-03 (×2): qty 1

## 2016-09-03 MED ORDER — ALBUTEROL SULFATE HFA 108 (90 BASE) MCG/ACT IN AERS
2.0000 | INHALATION_SPRAY | RESPIRATORY_TRACT | Status: DC | PRN
  Filled 2016-09-03: qty 6.7

## 2016-09-03 MED ORDER — ACETAMINOPHEN 650 MG RE SUPP: 650.0000 mg | Freq: Four times a day (QID) | RECTAL | Status: DC | PRN

## 2016-09-03 MED ORDER — ASPIRIN EC 81 MG PO TBEC: 81.0000 mg | DELAYED_RELEASE_TABLET | Freq: Every day | ORAL | Status: DC

## 2016-09-03 MED ORDER — DEXTROSE 5 % IV SOLN
1.0000 g | INTRAVENOUS | Status: DC
  Filled 2016-09-03 (×2): qty 10

## 2016-09-03 MED ORDER — POTASSIUM CHLORIDE CRYS ER 10 MEQ PO TBCR
10.0000 meq | EXTENDED_RELEASE_TABLET | Freq: Every day | ORAL | Status: DC
  Administered 2016-09-03 – 2016-09-04 (×2): 10 meq via ORAL
  Filled 2016-09-03 (×2): qty 1

## 2016-09-03 MED ORDER — ASPIRIN 81 MG PO CHEW
324.0000 mg | CHEWABLE_TABLET | Freq: Once | ORAL | Status: AC
  Administered 2016-09-03: 324 mg via ORAL
  Filled 2016-09-03: qty 4

## 2016-09-03 MED ORDER — FUROSEMIDE 40 MG PO TABS
40.0000 mg | ORAL_TABLET | Freq: Every day | ORAL | Status: DC
  Administered 2016-09-03 – 2016-09-04 (×2): 40 mg via ORAL
  Filled 2016-09-03 (×2): qty 1

## 2016-09-03 MED ORDER — NITROGLYCERIN 2 % TD OINT
0.5000 [in_us] | TOPICAL_OINTMENT | Freq: Once | TRANSDERMAL | Status: AC
  Administered 2016-09-03: 0.5 [in_us] via TOPICAL
  Filled 2016-09-03: qty 1

## 2016-09-03 MED ORDER — SODIUM CHLORIDE 0.9% FLUSH
3.0000 mL | Freq: Two times a day (BID) | INTRAVENOUS | Status: DC
  Administered 2016-09-03 – 2016-09-04 (×2): 3 mL via INTRAVENOUS

## 2016-09-03 MED ORDER — IPRATROPIUM-ALBUTEROL 0.5-2.5 (3) MG/3ML IN SOLN
3.0000 mL | Freq: Once | RESPIRATORY_TRACT | Status: AC
  Administered 2016-09-03: 3 mL via RESPIRATORY_TRACT
  Filled 2016-09-03: qty 3

## 2016-09-03 MED ORDER — DEXTROSE 5 % IV SOLN
500.0000 mg | INTRAVENOUS | Status: DC
  Administered 2016-09-03: 500 mg via INTRAVENOUS
  Filled 2016-09-03 (×2): qty 500

## 2016-09-03 NOTE — ED Notes (Signed)
Attempted to call report to 2A. Nurse in patient's room and said she will call back ASAP.

## 2016-09-03 NOTE — H&P (Signed)
[name] is an 51 y.o. female.   Chief Complaint: Cough and chest pain HPI: This is a 51 year old female who has a history of chronic bronchitis. She's been having cough for past few days. She's been out of some of her medications including her inhaler. She took some prednisone that she had left but is currently out. Today she felt like she had some in the back of her throat. But also she started having substernal chest pain that radiated to her back. Been relieved by nitroglycerin here in the ER.  Past Medical History:  Diagnosis Date  . Chest pain    negative Myoview march 2009  . HTN (hypertension)    controlled  . Lipoma    Hx-left leg. s/p excision  . Obesity   . OSA (obstructive sleep apnea)    mild  . Palpitation    event monito 2/10  . Rectal bleeding 5/10   normal colonoscopy. Dr. Deatra Ina  . Yeast infection     Past Surgical History:  Procedure Laterality Date  . ABDOMINAL HYSTERECTOMY    . hysterectomy unspecified area      Family History  Problem Relation Age of Onset  . Allergies Sister        and daughter  . Allergy (severe) Sister   . Heart attack Sister   . Asthma Daughter   . Allergy (severe) Daughter   . Coronary artery disease Unknown        female 1st degree relative <60/ female 1st degree <50  . Stroke Unknown        female 1st degree relatvie <50  . Heart disease Mother   . Heart attack Mother   . Other Mother        rheumatism  . Heart disease Father   . Heart disease Paternal Grandfather   . Heart attack Paternal Grandfather   . Stroke Paternal Grandfather   . Cancer Paternal Grandfather        prostate  . Prostate cancer Maternal Grandfather   . Other Maternal Grandmother        rheumatism   Social History:  reports that she has been smoking.  She does not have any smokeless tobacco history on file. She reports that she drinks alcohol. She reports that she does not use drugs.  Allergies:  Allergies  Allergen Reactions  . Sulfonamide  Derivatives Swelling     (Not in a hospital admission)  Results for orders placed or performed during the hospital encounter of 09/03/16 (from the past 48 hour(s))  Basic metabolic panel     Status: Abnormal   Collection Time: 09/03/16  8:57 AM  Result Value Ref Range   Sodium 136 135 - 145 mmol/L   Potassium 3.6 3.5 - 5.1 mmol/L   Chloride 105 101 - 111 mmol/L   CO2 25 22 - 32 mmol/L   Glucose, Bld 127 (H) 65 - 99 mg/dL   BUN 11 6 - 20 mg/dL   Creatinine, Ser 0.73 0.44 - 1.00 mg/dL   Calcium 8.8 (L) 8.9 - 10.3 mg/dL   GFR calc non Af Amer >60 >60 mL/min   GFR calc Af Amer >60 >60 mL/min    Comment: (NOTE) The eGFR has been calculated using the CKD EPI equation. This calculation has not been validated in all clinical situations. eGFR's persistently <60 mL/min signify possible Chronic Kidney Disease.    Anion gap 6 5 - 15  CBC     Status: None   Collection  Time: 09/03/16  8:57 AM  Result Value Ref Range   WBC 11.0 3.6 - 11.0 K/uL   RBC 4.84 3.80 - 5.20 MIL/uL   Hemoglobin 12.9 12.0 - 16.0 g/dL   HCT 39.6 35.0 - 47.0 %   MCV 81.8 80.0 - 100.0 fL   MCH 26.6 26.0 - 34.0 pg   MCHC 32.5 32.0 - 36.0 g/dL   RDW 13.5 11.5 - 14.5 %   Platelets 233 150 - 440 K/uL  Troponin I     Status: Abnormal   Collection Time: 09/03/16  8:57 AM  Result Value Ref Range   Troponin I 0.04 (HH) <0.03 ng/mL    Comment: CRITICAL RESULT CALLED TO, READ BACK BY AND VERIFIED WITH LAURA CATES ON 09/03/16 AT 0939 BY [facility]    Dg Neck Soft Tissue  Result Date: 09/03/2016 CLINICAL DATA:  Chest pain with throat swelling EXAM: NECK SOFT TISSUES - 1+ VIEW COMPARISON:  None. FINDINGS: There is no evidence of retropharyngeal soft tissue swelling or epiglottic enlargement. The cervical airway is unremarkable and no radio-opaque foreign body identified. IMPRESSION: Negative. Electronically Signed   By: Kathreen Devoid   On: 09/03/2016 08:58   Dg Chest 2 View  Result Date: 09/03/2016 CLINICAL DATA:  Diffuse chest  pain with throat swelling. EXAM: CHEST  2 VIEW COMPARISON:  05/16/2016 FINDINGS: The cardiomediastinal silhouette is within normal limits. Mild peribronchial thickening is again noted. No confluent airspace opacity, edema, pleural effusion, or pneumothorax is identified. Mild thoracic spondylosis is noted. IMPRESSION: Mild bronchitic changes. Electronically Signed   By: Logan Bores M.D.   On: 09/03/2016 08:58    Review of Systems  Constitutional: Negative for chills and fever.  HENT: Negative for hearing loss.   Eyes: Negative for blurred vision.  Respiratory: Positive for cough and wheezing.   Cardiovascular: Positive for chest pain.  Gastrointestinal: Negative for nausea and vomiting.  Genitourinary: Negative for dysuria.  Musculoskeletal: Positive for joint pain.  Skin: Negative for rash.  Neurological: Negative for dizziness.    Blood pressure (!) 136/94, pulse 70, temperature 98.7 F (37.1 C), temperature source Oral, resp. rate 15, height _0  (1.626 m), weight (!) 140.2 kg (309 lb), SpO2 96 %. Physical Exam  Constitutional: She is oriented to person, place, and time. She appears well-developed. No distress.  HENT:  Head: Normocephalic and atraumatic.  Mouth/Throat: Oropharynx is clear and moist. No oropharyngeal exudate.  Eyes: Pupils are equal, round, and reactive to light. No scleral icterus.  Neck: Neck supple. No JVD present. No tracheal deviation present. No thyromegaly present.  Cardiovascular: Normal rate and regular rhythm.   No murmur heard. Respiratory: Effort normal.  Mild expiratory wheezing.  GI: Soft. Bowel sounds are normal. She exhibits no distension. There is no tenderness.  Musculoskeletal:  2+ lower extremity edema left greater than right. Large surgical scar on left calf  Lymphadenopathy:    She has no cervical adenopathy.  Neurological: She is alert and oriented to person, place, and time. No cranial nerve deficit.  Skin: Skin is warm and dry.      Assessment/Plan 1. Chest pain. This may be related to her bronchitis. However she does have risk factors including strong family history, exposure to secondary smoke, diabetes and obesity. Her troponin is just slightly elevated at 0.04. We'll go ahead and start her on aspirin and continue her beta blocker. Cycle cardiac enzymes. If she rules out then will set her up for stress testing in the morning. 2. Bronchitis. She  does have chronic bronchitis and maybe having some acute exacerbation. She is out of her albuterol inhaler. I'll restart that go ahead and treat her with some antibiotics. At this point do not see the need for steroids. 3. Lower extremity edema. Smiling the left leg. She says the left leg has been swollen ever since she had surgery years ago. There is a large surgical scar on the calf part. She says she normally takes Lasix for it but has been out of Lasix for quite some time. It is not tender. Do not suspect DVT. We'll go ahead and restart her daily Lasix. 4. Diabetes. She tells me she is diet controlled and only takes metformin when she feels like she needs it. I'll go ahead and check a hemoglobin A1c. 5. Obstructive sleep apnea. She can use her home CPAP  Total time spent 35 minutes.  Baxter Hire, MD 09/03/2016, 10:58 AM

## 2016-09-03 NOTE — ED Provider Notes (Signed)
[facility] Emergency Department Provider Note   ____________________________________________   First MD Initiated Contact with Patient 09/03/16 0830     (approximate)  I have reviewed the triage vital signs and the nursing notes.   HISTORY  Chief Complaint Shortness of Breath    HPI [name] is a 51 y.o. female who says she's been having some coughing producing some clear sputum. His been having little bit of phlegm in her throat which makes it hard for her to breathe. Today she woke up and she was short of breath and had chest tightness and more throat swelling. She still having slight amount of chest tightness now she says. Breathing got much better after she coughed up a big lump of phlegm. She reports there is color and it. Chest is very mildly tight at present is been going on since about 6 this morning. Not really sure what makes it better or worse but since she's been laying down in the bed is gotten better.   Past Medical History:  Diagnosis Date  . Chest pain    negative Myoview march 2009  . HTN (hypertension)    controlled  . Lipoma    Hx-left leg. s/p excision  . Obesity   . OSA (obstructive sleep apnea)    mild  . Palpitation    event monito 2/10  . Rectal bleeding 5/10   normal colonoscopy. Dr. Arlyce Dice  . Yeast infection     Patient Active Problem List   Diagnosis Date Noted  . Viral URI 01/27/2014  . Hip pain 01/27/2014  . Vaginitis and vulvovaginitis 01/27/2014  . Other dysphagia 11/09/2013  . Thyroid nodule 11/09/2013  . Rhinitis 10/26/2013  . Nasal lesion 04/16/2013  . Routine general medical examination at a health care facility 08/25/2012  . DIABETES MELLITUS, TYPE II 07/19/2008  . HYPERLIPIDEMIA 07/19/2008  . ALLERGIC RHINITIS 07/19/2008  . GERD 07/19/2008  . PALPITATIONS 07/07/2008  . OBSTRUCTIVE SLEEP APNEA 06/14/2008  . OBESITY, MORBID 05/25/2008  . Essential hypertension 05/25/2008  . CHEST PAIN,  ATYPICAL 05/25/2008    Past Surgical History:  Procedure Laterality Date  . ABDOMINAL HYSTERECTOMY    . hysterectomy unspecified area      Prior to Admission medications   Medication Sig Start Date End Date Taking? Authorizing Provider  albuterol (PROVENTIL HFA;VENTOLIN HFA) 108 (90 BASE) MCG/ACT inhaler Inhale 2 puffs into the lungs every 4 (four) hours as needed for wheezing or shortness of breath. 12/01/14   Ward, Layla Maw, DO  aspirin EC 81 MG tablet Take 81 mg by mouth daily.    [provider]  EPINEPHrine 0.3 mg/0.3 mL IJ SOAJ injection Inject 0.3 mg into the skin daily as needed (allergies).  05/19/14   [provider]  fluticasone (FLONASE) 50 MCG/ACT nasal spray Place 2 sprays into both nostrils daily. 12/01/14   Ward, Layla Maw, DO  furosemide (LASIX) 40 MG tablet Take 1 tablet (40 mg total) by mouth daily. 09/15/14   Ward, Layla Maw, DO  ibuprofen (ADVIL,MOTRIN) 800 MG tablet Take 1 tablet (800 mg total) by mouth every 8 (eight) hours as needed for mild pain. 09/15/14   Ward, Layla Maw, DO  metFORMIN (GLUCOPHAGE-XR) 500 MG 24 hr tablet 2 tabs by mouth once daily. Patient taking differently: Take 1,000 mg by mouth daily.  11/25/12   Sandford Craze, NP  nebivolol (BYSTOLIC) 5 MG tablet Take 1 tablet (5 mg total) by mouth daily. 01/29/14   Sandford Craze, NP  omeprazole (PRILOSEC) 20 MG capsule Take 20 mg by mouth daily as needed (FOR ACID REFLUX).  11/25/12   Sandford Craze'Sullivan, Melissa, NP  potassium chloride (MICRO-K) 10 MEQ CR capsule Take 1 capsule (10 mEq total) by mouth daily. 01/29/14   Sandford Craze'Sullivan, Melissa, NP  predniSONE (DELTASONE) 10 MG tablet Take a daily regimen of 6,5,4,3,2,1 05/16/16   Hagler, Jami L, PA-C    Allergies Sulfonamide derivatives  Family History  Problem Relation Age of Onset  . Allergies Sister        and daughter  . Allergy (severe) Sister   . Heart attack Sister   . Asthma Daughter   . Allergy (severe) Daughter   . Coronary artery  disease Unknown        female 1st degree relative <60/ female 1st degree <50  . Stroke Unknown        female 1st degree relatvie <50  . Heart disease Mother   . Heart attack Mother   . Other Mother        rheumatism  . Heart disease Father   . Heart disease Paternal Grandfather   . Heart attack Paternal Grandfather   . Stroke Paternal Grandfather   . Cancer Paternal Grandfather        prostate  . Prostate cancer Maternal Grandfather   . Other Maternal Grandmother        rheumatism    Social History Social History  Substance Use Topics  . Smoking status: Current Some Day Smoker  . Smokeless tobacco: Not on file  . Alcohol use Yes     Comment: occasional    Review of Systems  Constitutional: No fever/chills Eyes: No visual changes. ENT: No sore throat. Cardiovascular:See history of present illness . Respiratory: See history of present illness Gastrointestinal: No abdominal pain.  No nausea, no vomiting.  No diarrhea.  No constipation. Genitourinary: Negative for dysuria. Musculoskeletal: Negative for back pain. Skin: Negative for rash. Neurological: Negative for headaches, focal weakness or numbness.   ____________________________________________   PHYSICAL EXAM:  VITAL SIGNS: ED Triage Vitals  Enc Vitals Group     BP 09/03/16 0821 (!) 155/111     Pulse Rate 09/03/16 0821 86     Resp 09/03/16 0821 (!) 24     Temp 09/03/16 0821 98.7 F (37.1 C)     Temp Source 09/03/16 0821 Oral     SpO2 09/03/16 0821 99 %     Weight 09/03/16 0822 (!) 309 lb (140.2 kg)     Height 09/03/16 0822 5\' 4"  (1.626 m)     Head Circumference --      Peak Flow --      Pain Score 09/03/16 0821 8     Pain Loc --      Pain Edu? --      Excl. in GC? --     Constitutional: Alert and oriented. Well appearing and in no acute distress. Eyes: Conjunctivae are normal. PERRL. EOMI. Head: Atraumatic. Nose: No congestion/rhinnorhea. Mouth/Throat: Mucous membranes are moist.  Oropharynx  non-erythematous. Neck: No stridor.  Hematological/Lymphatic/Immunilogical: No cervical lymphadenopathy. Cardiovascular: Normal rate, regular rhythm. Grossly normal heart sounds.  Good peripheral circulation. Respiratory: Normal respiratory effort.  No retractions. Lungs CTAB. Gastrointestinal: Soft and nontender. No distention. No abdominal bruits. No CVA tenderness. Musculoskeletal: No lower extremity tenderness nor edema.  No joint effusions. Neurologic:  Normal speech and language. No gross focal neurologic deficits are appreciated. No gait instability. Skin:  Skin is warm, dry and intact. No rash  noted.   ____________________________________________   LABS (all labs ordered are listed, but only abnormal results are displayed)  Labs Reviewed  BASIC METABOLIC PANEL - Abnormal; Notable for the following:       Result Value   Glucose, Bld 127 (*)    Calcium 8.8 (*)    All other components within normal limits  TROPONIN I - Abnormal; Notable for the following:    Troponin I 0.04 (*)    All other components within normal limits  CBC   ____________________________________________  EKG  EKG read and interpreted by me shows normal sinus rhythm rate of 76 normal axis no acute ST-T wave changes ____________________________________________  RADIOLOGY  IMPRESSION: Mild bronchitic changes.   Electronically Signed   By: Sebastian Ache M.D.   On: 09/03/2016 08:58  ____________________________________________   PROCEDURES  Procedure(s) performed:  Procedures  Critical Care performed:   ____________________________________________   INITIAL IMPRESSION / ASSESSMENT AND PLAN / ED COURSE  Pertinent labs & imaging results that were available during my care of the patient were reviewed by me and considered in my medical decision making (see chart for details).  Patient reports she had chest tightness this morning lasted for some time. Her troponin is elevated as never  been elevated before and her creatinine is normal I will ask the hospitalist to observe her.      ____________________________________________   FINAL CLINICAL IMPRESSION(S) / ED DIAGNOSES  Final diagnoses:  Chest pain, unspecified type  Elevated troponin      NEW MEDICATIONS STARTED DURING THIS VISIT:  New Prescriptions   No medications on file     Note:  This document was prepared using Dragon voice recognition software and may include unintentional dictation errors.    Arnaldo Natal, MD 09/03/16 1011

## 2016-09-03 NOTE — ED Triage Notes (Signed)
Pt reports shortness of breath, centralized chest pain and "throat swelling" today. Pt with clear sputum after coughing. Pt reports she's allergic to sulfa and a car smelled like rotten eggs x1 week ago. Pt's airway intact, no drooling noted. Pt alert/oriented, able to speak in clear sentences.

## 2016-09-03 NOTE — Progress Notes (Signed)
PHARMACIST - PHYSICIAN COMMUNICATION  CONCERNING:  Enoxaparin (Lovenox) for DVT Prophylaxis    RECOMMENDATION: Patient was prescribed enoxaprin 40mg  q24 hours for VTE prophylaxis.   Filed Weights   09/03/16 0822  Weight: (!) 309 lb (140.2 kg)    Body mass index is 53.04 kg/m.  Estimated Creatinine Clearance: 116.8 mL/min (by C-G formula based on SCr of 0.73 mg/dL).   [facility] is candidate for enoxaparin 40mg  every 12 hour dosing due to BMI being >40.   DESCRIPTION: Pharmacy has [facility]arin [facility], approved through [employer] committee.  Patient is now receiving enoxaparin 40mg  every 12 hours.   Horris LatinoHolly Michella Detjen, PharmD Pharmacy Resident 09/03/2016 12:32 PM

## 2016-09-03 NOTE — ED Notes (Signed)
Patient ambulatory to restroom without assistance. No increased work of breathing noted.

## 2016-09-04 ENCOUNTER — Other Ambulatory Visit: Payer: BLUE CROSS/BLUE SHIELD

## 2016-09-04 ENCOUNTER — Encounter: Payer: Self-pay | Admitting: Radiology

## 2016-09-04 ENCOUNTER — Observation Stay: Payer: BLUE CROSS/BLUE SHIELD

## 2016-09-04 LAB — GLUCOSE, CAPILLARY
GLUCOSE-CAPILLARY: 125 mg/dL — AB (ref 65–99)
GLUCOSE-CAPILLARY: 158 mg/dL — AB (ref 65–99)

## 2016-09-04 LAB — HEMOGLOBIN A1C
HEMOGLOBIN A1C: 6.7 % — AB (ref 4.8–5.6)
MEAN PLASMA GLUCOSE: 146 mg/dL

## 2016-09-04 LAB — TROPONIN I: Troponin I: 0.03 ng/mL (ref <0.03)

## 2016-09-04 MED ORDER — POTASSIUM CHLORIDE ER 10 MEQ PO CPCR: 10.0000 meq | ORAL_CAPSULE | Freq: Every day | ORAL | 0 refills | Status: AC

## 2016-09-04 MED ORDER — TECHNETIUM TC 99M TETROFOSMIN IV KIT
30.0000 | PACK | Freq: Once | INTRAVENOUS | Status: AC | PRN
  Administered 2016-09-04: 29.641 via INTRAVENOUS

## 2016-09-04 MED ORDER — ALBUTEROL SULFATE HFA 108 (90 BASE) MCG/ACT IN AERS: 2.0000 | INHALATION_SPRAY | RESPIRATORY_TRACT | 0 refills | Status: DC | PRN

## 2016-09-04 MED ORDER — REGADENOSON 0.4 MG/5ML IV SOLN
0.4000 mg | Freq: Once | INTRAVENOUS | Status: AC
  Administered 2016-09-04: 0.4 mg via INTRAVENOUS
  Filled 2016-09-04: qty 5

## 2016-09-04 MED ORDER — NEBIVOLOL HCL 5 MG PO TABS: 5.0000 mg | ORAL_TABLET | Freq: Every day | ORAL | 0 refills | Status: AC

## 2016-09-04 MED ORDER — FUROSEMIDE 40 MG PO TABS: 40.0000 mg | ORAL_TABLET | Freq: Every day | ORAL | 0 refills | Status: DC

## 2016-09-04 MED ORDER — LEVOFLOXACIN 750 MG PO TABS: 750.0000 mg | ORAL_TABLET | Freq: Every day | ORAL | 0 refills | Status: DC

## 2016-09-04 MED ORDER — LEVOFLOXACIN 750 MG PO TABS
750.0000 mg | ORAL_TABLET | Freq: Once | ORAL | Status: AC
  Administered 2016-09-04: 750 mg via ORAL
  Filled 2016-09-04: qty 1

## 2016-09-04 MED ORDER — CEFTRIAXONE SODIUM 2 G IJ SOLR
2.0000 g | INTRAMUSCULAR | Status: DC
  Administered 2016-09-04: 2 g via INTRAVENOUS
  Filled 2016-09-04: qty 2

## 2016-09-04 MED ORDER — OMEPRAZOLE 20 MG PO CPDR: 20.0000 mg | DELAYED_RELEASE_CAPSULE | Freq: Every day | ORAL | 0 refills | Status: DC | PRN

## 2016-09-04 NOTE — Plan of Care (Signed)
Problem: Safety: Goal: Ability to remain free from injury will improve Outcome: Progressing Fall precautions in place, non skid socks when oob  Problem: Pain Managment: Goal: General experience of comfort will improve Outcome: Progressing Prn Medications  Problem: Physical Regulation: Goal: Will remain free from infection Outcome: Not Progressing Remains on IV antibiotics  Problem: Tissue Perfusion: Goal: Risk factors for ineffective tissue perfusion will decrease Outcome: Progressing SQ Lovenox  Problem: Activity: Goal: Ability to tolerate increased activity will improve Outcome: Progressing Pt down for stress test today

## 2016-09-04 NOTE — Discharge Summary (Addendum)
[facility] [place] at [facility]   PATIENT NAME: [name]    MR#:  [id number]  DATE OF BIRTH:  09-22-1965  DATE OF ADMISSION:  09/03/2016 ADMITTING PHYSICIAN: Gracelyn Nurse, MD  DATE OF DISCHARGE: 09/04/2016  PRIMARY CARE PHYSICIAN: Real Cons    ADMISSION DIAGNOSIS:  Elevated troponin [R74.8] Chest pain, unspecified type [R07.9]  DISCHARGE DIAGNOSIS:  Active Problems:   Chest pain   SECONDARY DIAGNOSIS:   Past Medical History:  Diagnosis Date  . Chest pain    negative Myoview march 2009  . HTN (hypertension)    controlled  . Lipoma    Hx-left leg. s/p excision  . Obesity   . OSA (obstructive sleep apnea)    mild  . Palpitation    event monito 2/10  . Rectal bleeding 5/10   normal colonoscopy. Dr. Arlyce Dice  . Yeast infection     HOSPITAL COURSE:   51 year old obese female with history of hypertension diabetes who presented chest pain.  1. Chest pain: Patient has been ruled out for non-ST elevation MI. Patient underwent cardiac stress test which shows no evidence of ischemia. She did not need a second part of her stress test as per Dr Lady Gary.  2. Acute bronchitis: Patient will be discharged on Levaquin  3. Chronic lower extremity edema: Continue Lasix   4. Diabetes: Patient will continue outpatient regimen with close follow-up with PCP   5. OSA: Patient will continue CPAP        DISCHARGE CONDITIONS AND DIET:   Stable for discharge on diabetic heart healthy diet   CONSULTS OBTAINED:    DRUG ALLERGIES:   Allergies  Allergen Reactions  . Sulfonamide Derivatives Swelling    DISCHARGE MEDICATIONS:   Current Discharge Medication List    START taking these medications   Details  levofloxacin (LEVAQUIN) 750 MG tablet Take 1 tablet (750 mg total) by mouth daily. Qty: 5 tablet, Refills: 0      CONTINUE these medications which have CHANGED   Details  albuterol (PROVENTIL HFA;VENTOLIN HFA) 108 (90 Base) MCG/ACT  inhaler Inhale 2 puffs into the lungs every 4 (four) hours as needed for wheezing or shortness of breath. Qty: 1 Inhaler, Refills: 0    furosemide (LASIX) 40 MG tablet Take 1 tablet (40 mg total) by mouth daily. Qty: 30 tablet, Refills: 0    nebivolol (BYSTOLIC) 5 MG tablet Take 1 tablet (5 mg total) by mouth daily. Qty: 90 tablet, Refills: 0    omeprazole (PRILOSEC) 20 MG capsule Take 1 capsule (20 mg total) by mouth daily as needed (FOR ACID REFLUX). Qty: 30 capsule, Refills: 0    potassium chloride (MICRO-K) 10 MEQ CR capsule Take 1 capsule (10 mEq total) by mouth daily. Qty: 30 capsule, Refills: 0      CONTINUE these medications which have NOT CHANGED   Details  aspirin EC 81 MG tablet Take 81 mg by mouth daily.    EPINEPHrine 0.3 mg/0.3 mL IJ SOAJ injection Inject 0.3 mg into the skin daily as needed (allergies).     fluticasone (FLONASE) 50 MCG/ACT nasal spray Place 2 sprays into both nostrils daily. Qty: 16 g, Refills: 1    metFORMIN (GLUCOPHAGE-XR) 500 MG 24 hr tablet 2 tabs by mouth once daily. Qty: 60 tablet, Refills: 2      STOP taking these medications     predniSONE (DELTASONE) 10 MG tablet      ibuprofen (ADVIL,MOTRIN) 800 MG tablet  Today   CHIEF COMPLAINT:  Patient is doing well this morning. Reports no chest pain    VITAL SIGNS:  Blood pressure 134/86, pulse 81, temperature 97.9 F (36.6 C), temperature source Oral, resp. rate 16, height 5\' 4"  (1.626 m), weight (!) 140.2 kg (309 lb), SpO2 95 %.   REVIEW OF SYSTEMS:  Review of Systems  Constitutional: Negative.  Negative for chills, fever and malaise/fatigue.  HENT: Negative.  Negative for ear discharge, ear pain, hearing loss, nosebleeds and sore throat.   Eyes: Negative.  Negative for blurred vision and pain.  Respiratory: Negative.  Negative for cough, hemoptysis, shortness of breath and wheezing.   Cardiovascular: Negative.  Negative for chest pain, palpitations and leg swelling.   Gastrointestinal: Negative.  Negative for abdominal pain, blood in stool, diarrhea, nausea and vomiting.  Genitourinary: Negative.  Negative for dysuria.  Musculoskeletal: Negative.  Negative for back pain.  Skin: Negative.   Neurological: Negative for dizziness, tremors, speech change, focal weakness, seizures and headaches.  Endo/Heme/Allergies: Negative.  Does not bruise/bleed easily.  Psychiatric/Behavioral: Negative.  Negative for depression, hallucinations and suicidal ideas.     PHYSICAL EXAMINATION:  GENERAL:  51 y.o.-year-old patient lying in the bed with no acute distress. obese NECK:  Supple, no jugular venous distention. No thyroid enlargement, no tenderness.  LUNGS: Normal breath sounds bilaterally, no wheezing, rales,rhonchi  No use of accessory muscles of respiration.  CARDIOVASCULAR: S1, S2 normal. No murmurs, rubs, or gallops.  ABDOMEN: Soft, non-tender, non-distended. Bowel sounds present. No organomegaly or mass.  EXTREMITIES: No pedal edema, cyanosis, or clubbing.  PSYCHIATRIC: The patient is alert and oriented x 3.  SKIN: No obvious rash, lesion, or ulcer.   DATA REVIEW:   CBC  Recent Labs Lab 09/03/16 0857  WBC 11.0  HGB 12.9  HCT 39.6  PLT 233    Chemistries   Recent Labs Lab 09/03/16 0857  NA 136  K 3.6  CL 105  CO2 25  GLUCOSE 127*  BUN 11  CREATININE 0.73  CALCIUM 8.8*    Cardiac Enzymes  Recent Labs Lab 09/03/16 1502 09/03/16 2059 09/04/16 0019  TROPONINI <0.03 <0.03 0.03*    Microbiology Results  @[email or web address] @  RADIOLOGY:  Dg Neck Soft Tissue  Result Date: 09/03/2016 CLINICAL DATA:  Chest pain with throat swelling EXAM: NECK SOFT TISSUES - 1+ VIEW COMPARISON:  None. FINDINGS: There is no evidence of retropharyngeal soft tissue swelling or epiglottic enlargement. The cervical airway is unremarkable and no radio-opaque foreign body identified. IMPRESSION: Negative. Electronically Signed   By: Elige KoHetal  Patel   On: 09/03/2016  08:58   Dg Chest 2 View  Result Date: 09/03/2016 CLINICAL DATA:  Diffuse chest pain with throat swelling. EXAM: CHEST  2 VIEW COMPARISON:  05/16/2016 FINDINGS: The cardiomediastinal silhouette is within normal limits. Mild peribronchial thickening is again noted. No confluent airspace opacity, edema, pleural effusion, or pneumothorax is identified. Mild thoracic spondylosis is noted. IMPRESSION: Mild bronchitic changes. Electronically Signed   By: Sebastian AcheAllen  Grady M.D.   On: 09/03/2016 08:58      Current Discharge Medication List    START taking these medications   Details  levofloxacin (LEVAQUIN) 750 MG tablet Take 1 tablet (750 mg total) by mouth daily. Qty: 5 tablet, Refills: 0      CONTINUE these medications which have CHANGED   Details  albuterol (PROVENTIL HFA;VENTOLIN HFA) 108 (90 Base) MCG/ACT inhaler Inhale 2 puffs into the lungs every 4 (four) hours as needed for wheezing or shortness of  breath. Qty: 1 Inhaler, Refills: 0    furosemide (LASIX) 40 MG tablet Take 1 tablet (40 mg total) by mouth daily. Qty: 30 tablet, Refills: 0    nebivolol (BYSTOLIC) 5 MG tablet Take 1 tablet (5 mg total) by mouth daily. Qty: 90 tablet, Refills: 0    omeprazole (PRILOSEC) 20 MG capsule Take 1 capsule (20 mg total) by mouth daily as needed (FOR ACID REFLUX). Qty: 30 capsule, Refills: 0    potassium chloride (MICRO-K) 10 MEQ CR capsule Take 1 capsule (10 mEq total) by mouth daily. Qty: 30 capsule, Refills: 0      CONTINUE these medications which have NOT CHANGED   Details  aspirin EC 81 MG tablet Take 81 mg by mouth daily.    EPINEPHrine 0.3 mg/0.3 mL IJ SOAJ injection Inject 0.3 mg into the skin daily as needed (allergies).     fluticasone (FLONASE) 50 MCG/ACT nasal spray Place 2 sprays into both nostrils daily. Qty: 16 g, Refills: 1    metFORMIN (GLUCOPHAGE-XR) 500 MG 24 hr tablet 2 tabs by mouth once daily. Qty: 60 tablet, Refills: 2      STOP taking these medications      predniSONE (DELTASONE) 10 MG tablet      ibuprofen (ADVIL,MOTRIN) 800 MG tablet            Management plans discussed with the patient and she is in agreement. Stable for discharge home  Patient should follow up with pcp  CODE STATUS:     Code Status Orders        Start     Ordered   09/03/16 1223  Full code  Continuous     09/03/16 1222    Code Status History    Date Active Date Inactive Code Status Order ID Comments User Context   This patient has a current code status but no historical code status.      TOTAL TIME TAKING CARE OF THIS PATIENT: 37 minutes.    Note: This dictation was prepared with Dragon dictation along with smaller phrase technology. Any transcriptional errors that result from this process are unintentional.  Arali Somera M.D on 09/04/2016 at 10:34 AM  Between 7am to 6pm - Pager - [phone] After 6pm go to [email or web address] - Social research officer, government  [facility]  Office  [phone]  CC: Primary care physician; Real Cons, PA at [facility]

## 2016-09-04 NOTE — Progress Notes (Signed)
Dr. Juliene PinaMody made aware that pt does not have an IV, orders left to leave out and dc IV antibiotics

## 2016-09-04 NOTE — Care Management (Signed)
Confirmed that cpap is working.

## 2016-09-04 NOTE — Progress Notes (Signed)
Stress portion of sestimibi was normal with no stress induced changes. Does not need rest images.

## 2016-09-04 NOTE — Progress Notes (Signed)
Pt discharged to home via wc.  Instructions and rx given to pt.  Questions answered.  No distress.  

## 2016-09-04 NOTE — Progress Notes (Signed)
Pt back from cardiovascular testing

## 2016-09-04 NOTE — Progress Notes (Signed)
To cardiovascular testing via bed 

## 2016-09-04 NOTE — Care Management (Signed)
Spoke with patient regarding documentation that she stopped taking some of her medications. Acknowledge she did not get some of her medications refilled.  has good insurance but they are still costly because patient insist on obtaining brand names rather than generics.  She does not feel generics work as well.  If she take furosemide in the place of lasix- it makes her legs swell more.  Denies she will have any issues keeping her medications filled.

## 2016-09-04 NOTE — Care Management (Signed)
Daughter brought patient's home cpap machine to CM stating "it is not working."    Stated received machine from [facility] 2009 and when contacted [facility] to have machine repaired or serviced- informed the machine was too old.  CM plugged in machine and it appears to be working. Barbara CowerJason with [facility] is assessing.

## 2016-09-04 NOTE — Progress Notes (Signed)
[facility] [place] [facility]        [name] was admitted to the Hospital on 09/03/2016 and Discharged  09/04/2016 and should be excused from work/school   for 1  days starting 09/03/2016 , may return to work/school without any restrictions.  Call Adrian SaranSital Britten Seyfried MD with questions.  Arrian Manson M.D on 09/04/2016,at 4:13 PM  [facility] [place] [facility]    Office  [phone]

## 2016-09-04 NOTE — Progress Notes (Signed)
Pharmacist - Prescriber Communication  Ceftriaxone dose modified from 1 gm IV Q24H to 2 gm IV Q24H due to body weight greater than 80 kg.   Minka Knight A. [place], Vermont[place]arm.D., BCPS Clinical Pharmacist 09/04/2016 10:15

## 2016-09-05 ENCOUNTER — Inpatient Hospital Stay: Payer: BLUE CROSS/BLUE SHIELD

## 2016-09-05 LAB — NM MYOCAR MULTI W/SPECT W/WALL MOTION / EF
CHL CUP NUCLEAR SSS: 1
CHL CUP RESTING HR STRESS: 73 {beats}/min
Estimated workload: 1 METS
Exercise duration (min): 1 min
Exercise duration (sec): 0 s
LV sys vol: 82 mL
LVDIAVOL: 33 mL (ref 46–106)
MPHR: 169 {beats}/min
Peak HR: 110 {beats}/min
Percent HR: 65 %

## 2016-09-05 LAB — HIV ANTIBODY (ROUTINE TESTING W REFLEX): HIV SCREEN 4TH GENERATION: NONREACTIVE

## 2016-12-29 ENCOUNTER — Emergency Department: Payer: BLUE CROSS/BLUE SHIELD

## 2016-12-29 ENCOUNTER — Encounter: Payer: Self-pay | Admitting: Emergency Medicine

## 2016-12-29 ENCOUNTER — Emergency Department
Admission: EM | Admit: 2016-12-29 | Discharge: 2016-12-29 | Disposition: A | Discharge status: Home / Self Care | Payer: BLUE CROSS/BLUE SHIELD | Attending: Emergency Medicine | Admitting: Emergency Medicine

## 2016-12-29 DIAGNOSIS — Y929 Unspecified place or not applicable: Secondary | ICD-10-CM | POA: Insufficient documentation

## 2016-12-29 DIAGNOSIS — R51 Headache: Secondary | ICD-10-CM | POA: Diagnosis present

## 2016-12-29 DIAGNOSIS — Y999 Unspecified external cause status: Secondary | ICD-10-CM | POA: Insufficient documentation

## 2016-12-29 DIAGNOSIS — G44319 Acute post-traumatic headache, not intractable: Secondary | ICD-10-CM

## 2016-12-29 DIAGNOSIS — Z79899 Other long term (current) drug therapy: Secondary | ICD-10-CM | POA: Insufficient documentation

## 2016-12-29 DIAGNOSIS — W010XXA Fall on same level from slipping, tripping and stumbling without subsequent striking against object, initial encounter: Secondary | ICD-10-CM | POA: Diagnosis not present

## 2016-12-29 DIAGNOSIS — Z7984 Long term (current) use of oral hypoglycemic drugs: Secondary | ICD-10-CM | POA: Insufficient documentation

## 2016-12-29 DIAGNOSIS — K029 Dental caries, unspecified: Secondary | ICD-10-CM | POA: Diagnosis not present

## 2016-12-29 DIAGNOSIS — Z7982 Long term (current) use of aspirin: Secondary | ICD-10-CM | POA: Insufficient documentation

## 2016-12-29 DIAGNOSIS — E119 Type 2 diabetes mellitus without complications: Secondary | ICD-10-CM | POA: Insufficient documentation

## 2016-12-29 DIAGNOSIS — Y939 Activity, unspecified: Secondary | ICD-10-CM | POA: Diagnosis not present

## 2016-12-29 DIAGNOSIS — S0003XA Contusion of scalp, initial encounter: Secondary | ICD-10-CM | POA: Insufficient documentation

## 2016-12-29 DIAGNOSIS — I10 Essential (primary) hypertension: Secondary | ICD-10-CM | POA: Diagnosis not present

## 2016-12-29 MED ORDER — PENICILLIN V POTASSIUM 500 MG PO TABS: 500.0000 mg | ORAL_TABLET | Freq: Four times a day (QID) | ORAL | 0 refills | Status: DC

## 2016-12-29 MED ORDER — FUROSEMIDE 40 MG PO TABS: ORAL_TABLET | ORAL | 0 refills | Status: DC

## 2016-12-29 MED ORDER — HYDROCODONE-ACETAMINOPHEN 5-325 MG PO TABS: 1.0000 | ORAL_TABLET | Freq: Four times a day (QID) | ORAL | 0 refills | Status: DC | PRN

## 2016-12-29 NOTE — ED Triage Notes (Signed)
Patient states fell and hit head on concrete 6 days ago, had hematoma R scalp, denies LOC, concerned re continued headache and dizziness. States that she does not take blood thinners normally but that she took 1/2of one her brothers blood thinners the day it happened. Alert, oriented, speaking full and clear sentences.

## 2016-12-29 NOTE — ED Provider Notes (Signed)
[facility] Emergency Department Provider Note   ____________________________________________   First MD Initiated Contact with Patient 12/29/16 1357     (approximate)  I have reviewed the triage vital signs and the nursing notes.   HISTORY  Chief Complaint Headache    HPI [name] is a 51 y.o. female is here with complaint of headache. Patient is also worried that a hematoma to the right scalp has not resolved. Patient states that she fell due to water and hit her head on concrete 6 days ago. She continues to have headaches and dizziness. She complains of blurred vision. She told the triage nurse that she took 1/2 pill  her brother's of blood thinners. This medication was actually an over-the-counter medication purchased at [facility]. She also wants an antibiotic for dental pain. She states she was seen at the dental school [facility] and was told to return for antibiotics which [facility] prevented. She is also out of her Lasix pills for lower leg edema. She rates her pain is 7 out of 10.   Past Medical History:  Diagnosis Date  . Chest pain    negative Myoview march 2009  . HTN (hypertension)    controlled  . Lipoma    Hx-left leg. s/p excision  . Obesity   . OSA (obstructive sleep apnea)    mild  . Palpitation    event monito 2/10  . Rectal bleeding 5/10   normal colonoscopy. Dr. Arlyce Dice  . Yeast infection     Patient Active Problem List   Diagnosis Date Noted  . Chest pain 09/03/2016  . Viral URI 01/27/2014  . Hip pain 01/27/2014  . Vaginitis and vulvovaginitis 01/27/2014  . Other dysphagia 11/09/2013  . Thyroid nodule 11/09/2013  . Rhinitis 10/26/2013  . Nasal lesion 04/16/2013  . Routine general medical examination at a health care facility 08/25/2012  . DIABETES MELLITUS, TYPE II 07/19/2008  . HYPERLIPIDEMIA 07/19/2008  . ALLERGIC RHINITIS 07/19/2008  . GERD 07/19/2008  . PALPITATIONS 07/07/2008  . OBSTRUCTIVE SLEEP APNEA  06/14/2008  . OBESITY, MORBID 05/25/2008  . Essential hypertension 05/25/2008  . CHEST PAIN, ATYPICAL 05/25/2008    Past Surgical History:  Procedure Laterality Date  . ABDOMINAL HYSTERECTOMY    . hysterectomy unspecified area      Prior to Admission medications   Medication Sig Start Date End Date Taking? Authorizing Provider  albuterol (PROVENTIL HFA;VENTOLIN HFA) 108 (90 Base) MCG/ACT inhaler Inhale 2 puffs into the lungs every 4 (four) hours as needed for wheezing or shortness of breath. 09/04/16   Adrian Saran, MD  aspirin EC 81 MG tablet Take 81 mg by mouth daily.    [provider]  EPINEPHrine 0.3 mg/0.3 mL IJ SOAJ injection Inject 0.3 mg into the skin daily as needed (allergies).  05/19/14   [provider]  fluticasone (FLONASE) 50 MCG/ACT nasal spray Place 2 sprays into both nostrils daily. 12/01/14   Ward, Layla Maw, DO  furosemide (LASIX) 40 MG tablet 1 tablet 1-2 times per day prn edema 12/29/16 02/06/17  Tommi Rumps, PA-C  HYDROcodone-acetaminophen (NORCO/VICODIN) 5-325 MG tablet Take 1 tablet by mouth every 6 (six) hours as needed for moderate pain. 12/29/16   Tommi Rumps, PA-C  levofloxacin (LEVAQUIN) 750 MG tablet Take 1 tablet (750 mg total) by mouth daily. 09/04/16   Adrian Saran, MD  metFORMIN (GLUCOPHAGE-XR) 500 MG 24 hr tablet 2 tabs by mouth once daily. Patient taking differently: Take 1,000 mg by mouth  daily.  11/25/12   Sandford Craze, NP  nebivolol (BYSTOLIC) 5 MG tablet Take 1 tablet (5 mg total) by mouth daily. 09/04/16   Adrian Saran, MD  omeprazole (PRILOSEC) 20 MG capsule Take 1 capsule (20 mg total) by mouth daily as needed (FOR ACID REFLUX). 09/04/16   Adrian Saran, MD  penicillin v potassium (VEETID) 500 MG tablet Take 1 tablet (500 mg total) by mouth 4 (four) times daily. 12/29/16   Tommi Rumps, PA-C  potassium chloride (MICRO-K) 10 MEQ CR capsule Take 1 capsule (10 mEq total) by mouth daily. 09/04/16   Adrian Saran, MD     Allergies Sulfonamide derivatives  Family History  Problem Relation Age of Onset  . Allergies Sister        and daughter  . Allergy (severe) Sister   . Heart attack Sister   . Asthma Daughter   . Allergy (severe) Daughter   . Coronary artery disease Unknown        female 1st degree relative <60/ female 1st degree <50  . Stroke Unknown        female 1st degree relatvie <50  . Heart disease Mother   . Heart attack Mother   . Other Mother        rheumatism  . Heart disease Father   . Heart disease Paternal Grandfather   . Heart attack Paternal Grandfather   . Stroke Paternal Grandfather   . Cancer Paternal Grandfather        prostate  . Prostate cancer Maternal Grandfather   . Other Maternal Grandmother        rheumatism    Social History Social History  Substance Use Topics  . Smoking status: Current Some Day Smoker  . Smokeless tobacco: Never Used  . Alcohol use Yes     Comment: occasional    Review of Systems Constitutional: No fever/chills Cardiovascular: Denies chest pain. Respiratory: Denies shortness of breath. Gastrointestinal:  No nausea, no vomiting.   Musculoskeletal: Negative for back pain. Skin: Negative for rash.   Neurological: Positive for headaches, no focal weakness or numbness. ___________________________________________   PHYSICAL EXAM:  VITAL SIGNS: ED Triage Vitals [12/29/16 1305]  Enc Vitals Group     BP (!) 149/67     Pulse Rate 69     Resp 18     Temp 98.6 F (37 C)     Temp Source Oral     SpO2 97 %     Weight (!) 309 lb (140.2 kg)     Height  (1.651 m)     Head Circumference      Peak Flow      Pain Score 7     Pain Loc      Pain Edu?      Excl. in GC?    Constitutional: Alert and oriented. Well appearing and in no acute distress. Eyes: Conjunctivae are normal. PERRL. EOMI. Head: Atraumatic. Nose: No trauma Mouth/Throat: Mucous membranes are moist.  Oropharynx non-erythematous.  Left lower molar with Carrie  posteriorly. Minimal gum edema. Tender to palpation with a tongue depressor. No obvious abscess seen.  Neck: No stridor. Cervical spine nontender palpation posteriorly. Range of motion is without restriction. Cardiovascular: Normal rate, regular rhythm. Grossly normal heart sounds.  Good peripheral circulation. Respiratory: Normal respiratory effort.  No retractions. Lungs CTAB. Musculoskeletal: On examination of the upper and lower extremity there is no deformity and patient has full range of motion. Patient was noted to be ambulatory without  assistance in the department. Nontender thoracic or lumbar spine. Neurologic:  Normal speech and language. No gross focal neurologic deficits are appreciated. No gait instability. Skin:  Skin is warm, dry and intact. There is minimal soft tissue edema present to the right lateral scalp. No abrasions were noted. No evidence of infection. Moderate tenderness on palpation in this area. Psychiatric: Mood and affect are normal. Speech and behavior are normal.  ____________________________________________   LABS (all labs ordered are listed, but only abnormal results are displayed)  Labs Reviewed - No data to display  RADIOLOGY  Ct Head Wo Contrast  Result Date: 12/29/2016 CLINICAL DATA:  Head trauma 6 days ago. Acute headache. Normal neuro exam EXAM: CT HEAD WITHOUT CONTRAST TECHNIQUE: Contiguous axial images were obtained from the base of the skull through the vertex without intravenous contrast. COMPARISON:  None. FINDINGS: Brain: No evidence of acute infarction, hemorrhage, hydrocephalus, extra-axial collection or mass lesion/mass effect. Vascular: Negative for hyperdense vessel Skull: Negative Sinuses/Orbits: Mucosal edema and bony thickening of the right sphenoid sinus due to chronic sinusitis. Remaining sinuses clear. Negative orbit Other: None IMPRESSION: Normal CT of the brain Chronic sphenoid sinusitis Electronically Signed   By: Marlan Palau M.D.    On: 12/29/2016 13:51    ____________________________________________   PROCEDURES  Procedure(s) performed: None  Procedures  Critical Care performed: No  ____________________________________________   INITIAL IMPRESSION / ASSESSMENT AND PLAN / ED COURSE  Pertinent labs & imaging results that were available during my care of the patient were reviewed by me and considered in my medical decision making (see chart for details).  Patient was reassured that her CT did not show any intracranial injury. Patient will continue use ice to her scalp as needed for discomfort. She is also given a prescription for Pen-Vee K 500 mg 4 times a day until finished. She is also given a prescription for Lasix 40 mg one daily as treated by her doctor. She is to call Monday to her doctor in [place] for any further concerns and for additional medication. Patient was discharged with prescription for Percocet as needed for her headache. Patient was not given any medication in the ED as she did not have a driver.    ___________________________________________   FINAL CLINICAL IMPRESSION(S) / ED DIAGNOSES  Final diagnoses:  Contusion of scalp, initial encounter  Acute post-traumatic headache, not intractable  Pain due to dental caries      NEW MEDICATIONS STARTED DURING THIS VISIT:  Discharge Medication List as of 12/29/2016  2:24 PM    START taking these medications   Details  HYDROcodone-acetaminophen (NORCO/VICODIN) 5-325 MG tablet Take 1 tablet by mouth every 6 (six) hours as needed for moderate pain., Starting Sat 12/29/2016, Print    penicillin v potassium (VEETID) 500 MG tablet Take 1 tablet (500 mg total) by mouth 4 (four) times daily., Starting Sat 12/29/2016, Print         Note:  This document was prepared using Dragon voice recognition software and may include unintentional dictation errors.    Tommi Rumps, PA-C 12/29/16 1449    Nita Sickle, MD 01/02/17  [phone]

## 2016-12-29 NOTE — ED Notes (Signed)
Pt sitting up eating and drinking her personal food that she brought from home. Pt is alert and oriented, pt out from to desk at this time to ask when a doctor would be to bedside, states, she needs to tell the doctor about the L sided facial numbness she continues to have.

## 2016-12-29 NOTE — ED Notes (Signed)
NAD noted at time of D/C. Pt denies questions or concerns. Pt ambulatory to the lobby at this time.  

## 2016-12-29 NOTE — ED Triage Notes (Signed)
Patient did not have CT at original injury, MD consulted and CT ordered.

## 2016-12-29 NOTE — Discharge Instructions (Signed)
Call and make an appointment with your doctor at [facility] if any continued problems. Also follow-up with the dental school at [facility] for her dental pain. Begin taking Pen-Vee K 500 mg 4 times a day until finished. Take Norco one every 6 hours as needed for pain. You may continue using ice to your scalp for comfort. Also call your doctor for continued prescriptions for Lasix.

## 2017-05-07 ENCOUNTER — Other Ambulatory Visit: Payer: Self-pay | Admitting: Family Medicine

## 2017-05-07 DIAGNOSIS — Z1231 Encounter for screening mammogram for malignant neoplasm of breast: Secondary | ICD-10-CM

## 2017-06-10 ENCOUNTER — Other Ambulatory Visit: Payer: Self-pay | Admitting: Family Medicine

## 2017-06-10 ENCOUNTER — Ambulatory Visit
Admission: RE | Admit: 2017-06-10 | Discharge: 2017-06-10 | Disposition: A | Discharge status: Home / Self Care | Payer: BLUE CROSS/BLUE SHIELD | Source: Ambulatory Visit | Attending: Family Medicine | Admitting: Family Medicine

## 2017-06-10 DIAGNOSIS — Z1231 Encounter for screening mammogram for malignant neoplasm of breast: Secondary | ICD-10-CM

## 2018-05-29 IMAGING — CR DG NECK SOFT TISSUE
1 series · 2 of 2 positions shown · non-contrast
Comparison: None.

CLINICAL DATA: Chest pain with throat swelling

EXAM:
NECK SOFT TISSUES - 1+ VIEW

[Series 1: dg neck soft tissue · 0.14mm/px · 2 of 2 slices shown]
[im 1/2]
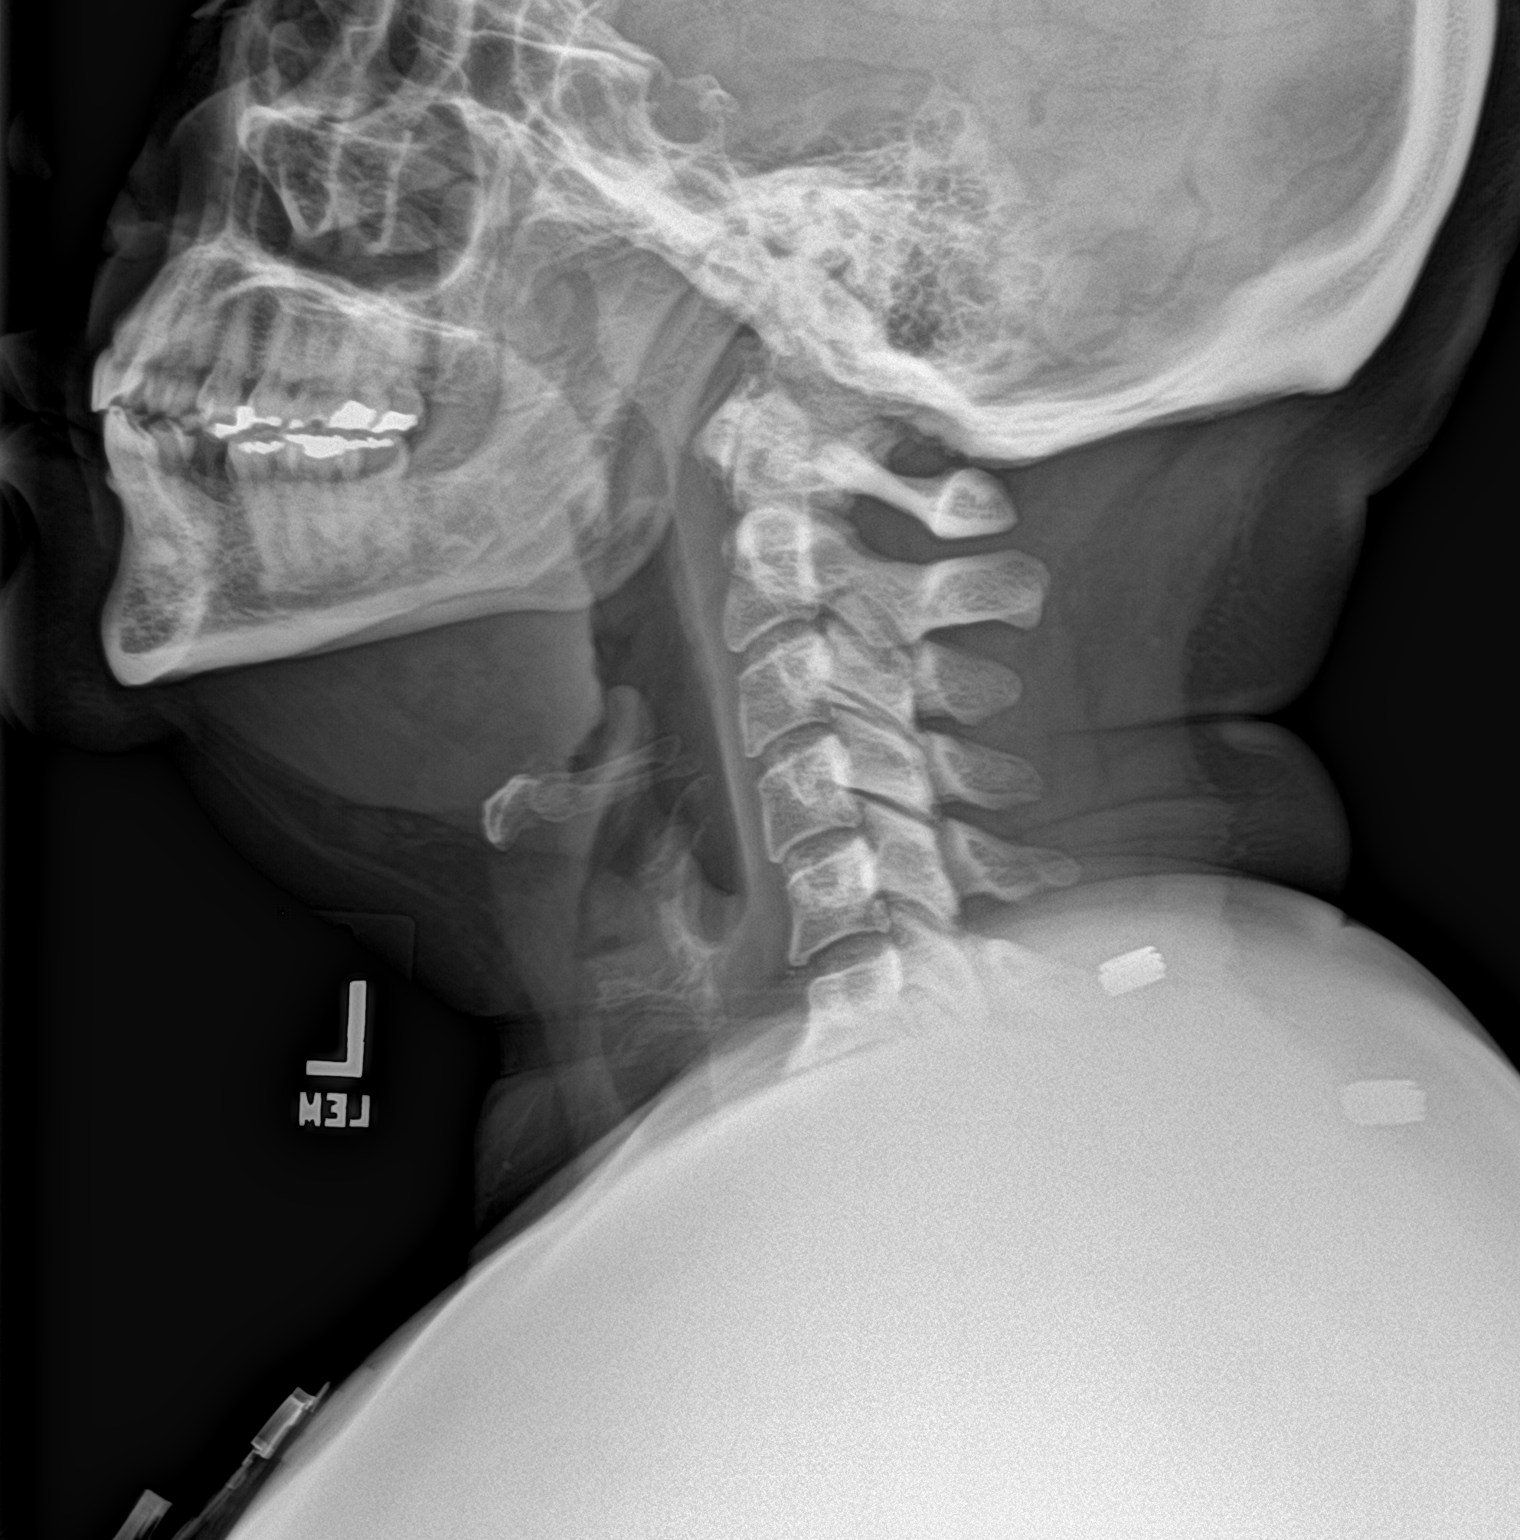
[im 2/2]
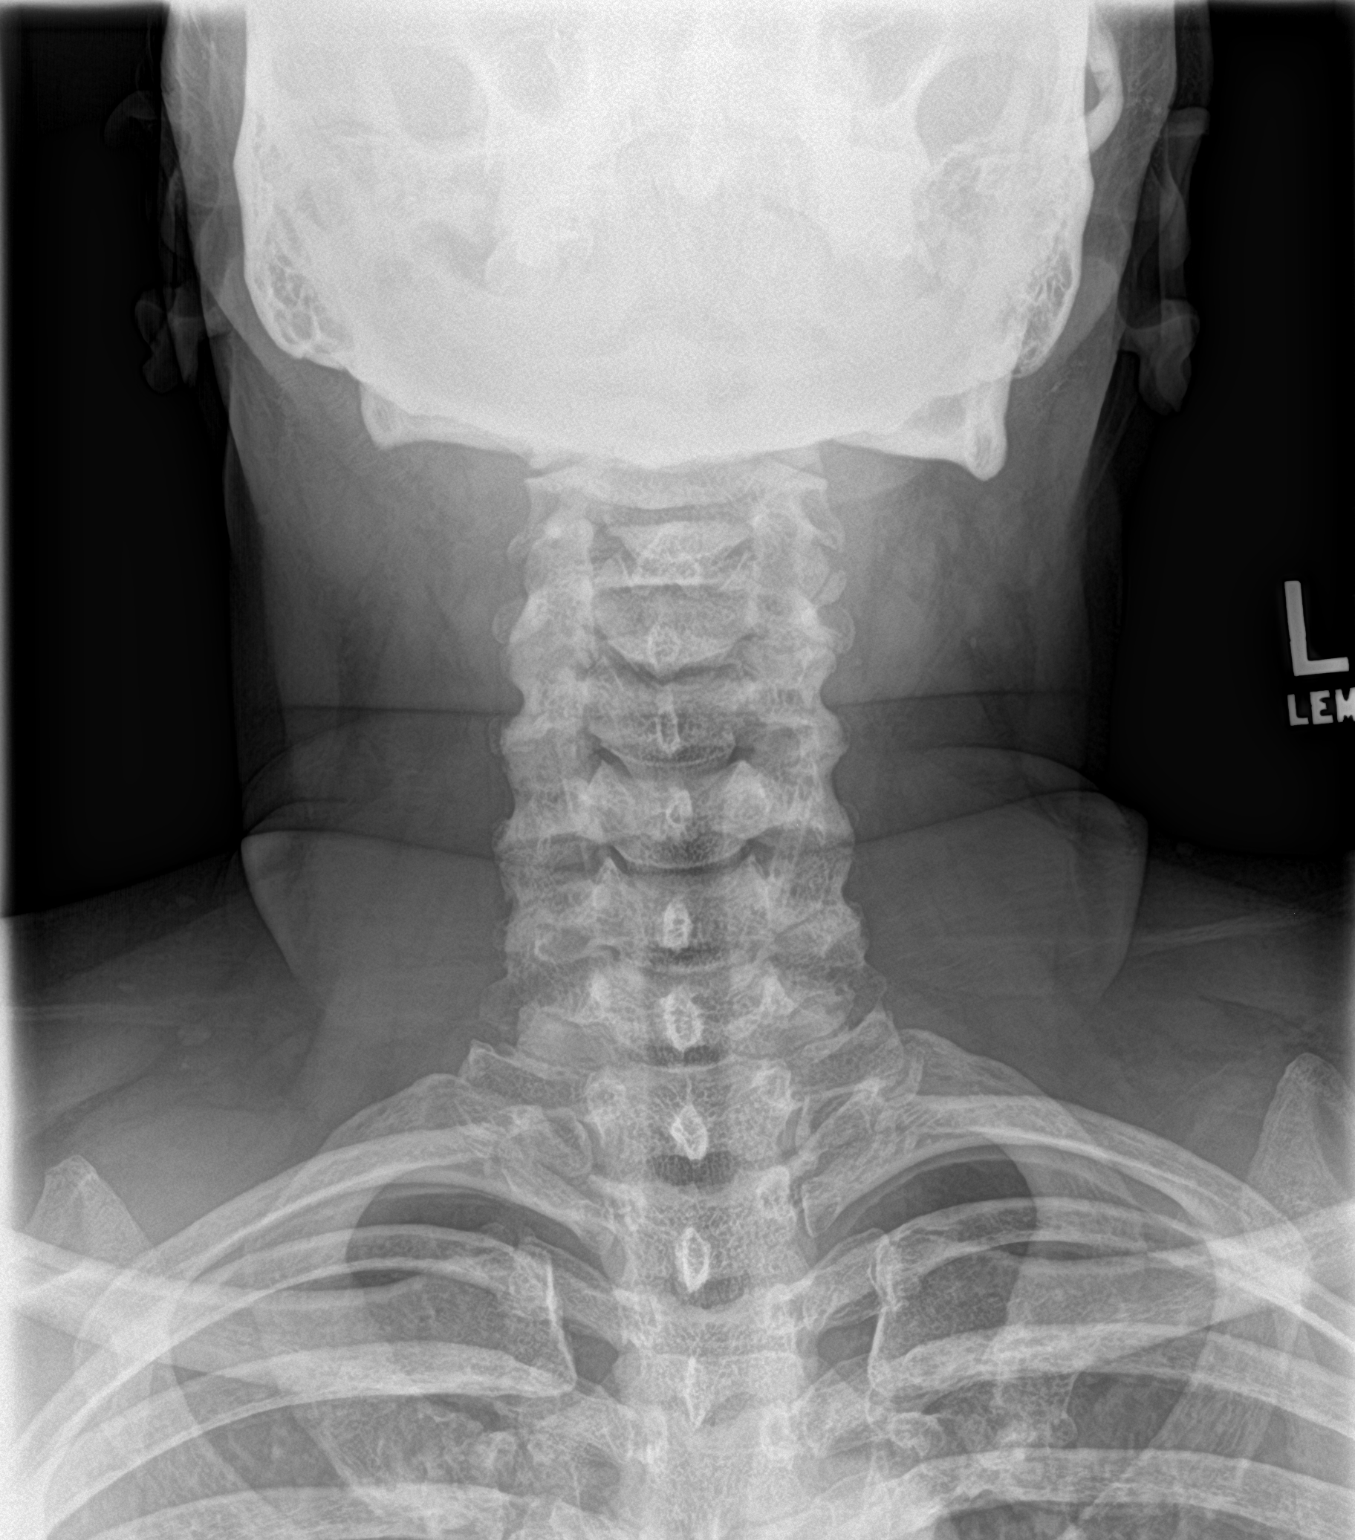

[2 of 2 positions shown; findings below may reference images not displayed]

FINDINGS: There is no evidence of retropharyngeal soft tissue swelling or
epiglottic enlargement. The cervical airway is unremarkable and no
radio-opaque foreign body identified.
IMPRESSION: Negative.

## 2018-05-29 IMAGING — CR DG CHEST 2V
1 series · 2 of 2 positions shown · non-contrast
Comparison: 05/16/2016

CLINICAL DATA: Diffuse chest pain with throat swelling.

EXAM:
CHEST  2 VIEW

[Series 1: dg chest 2 view · 0.14mm/px · 2 of 2 slices shown]
[im 1/2]
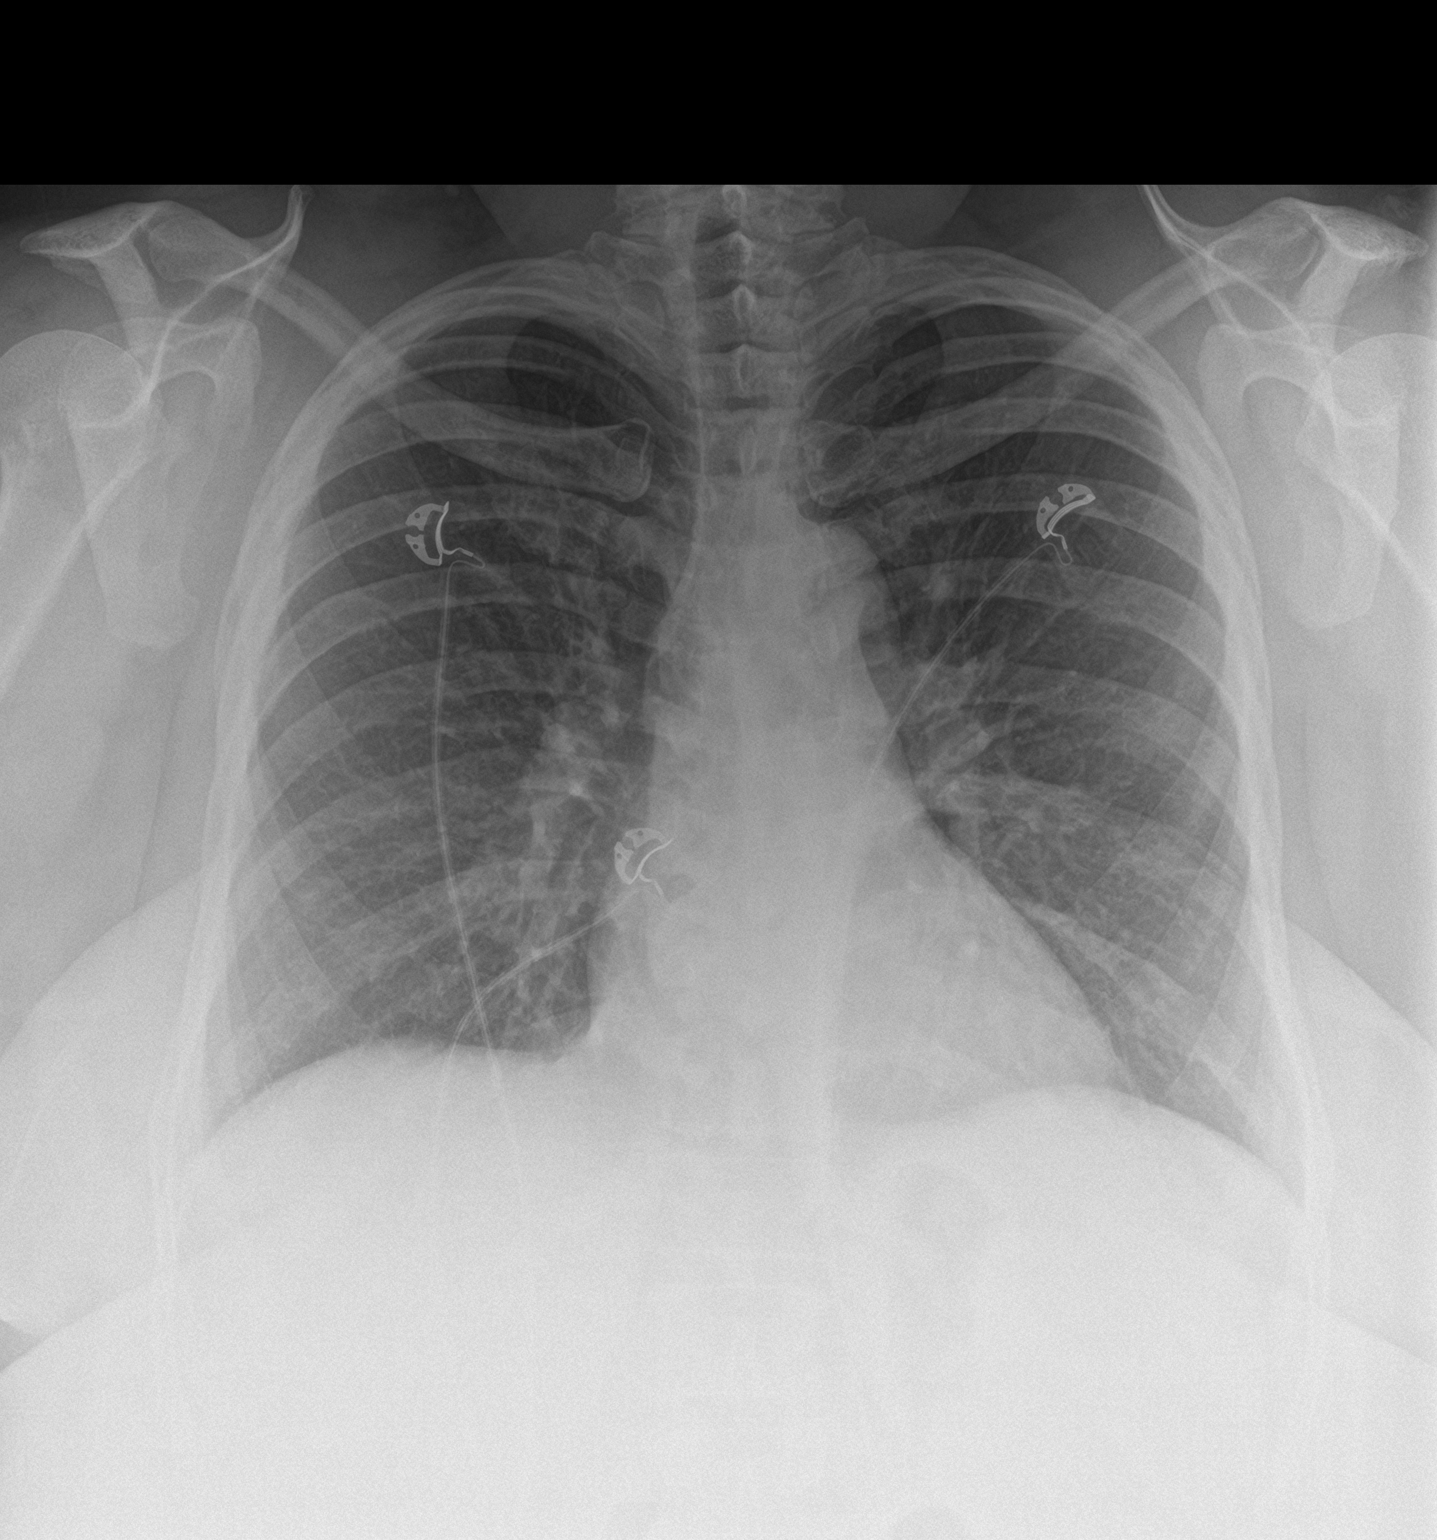
[im 2/2]
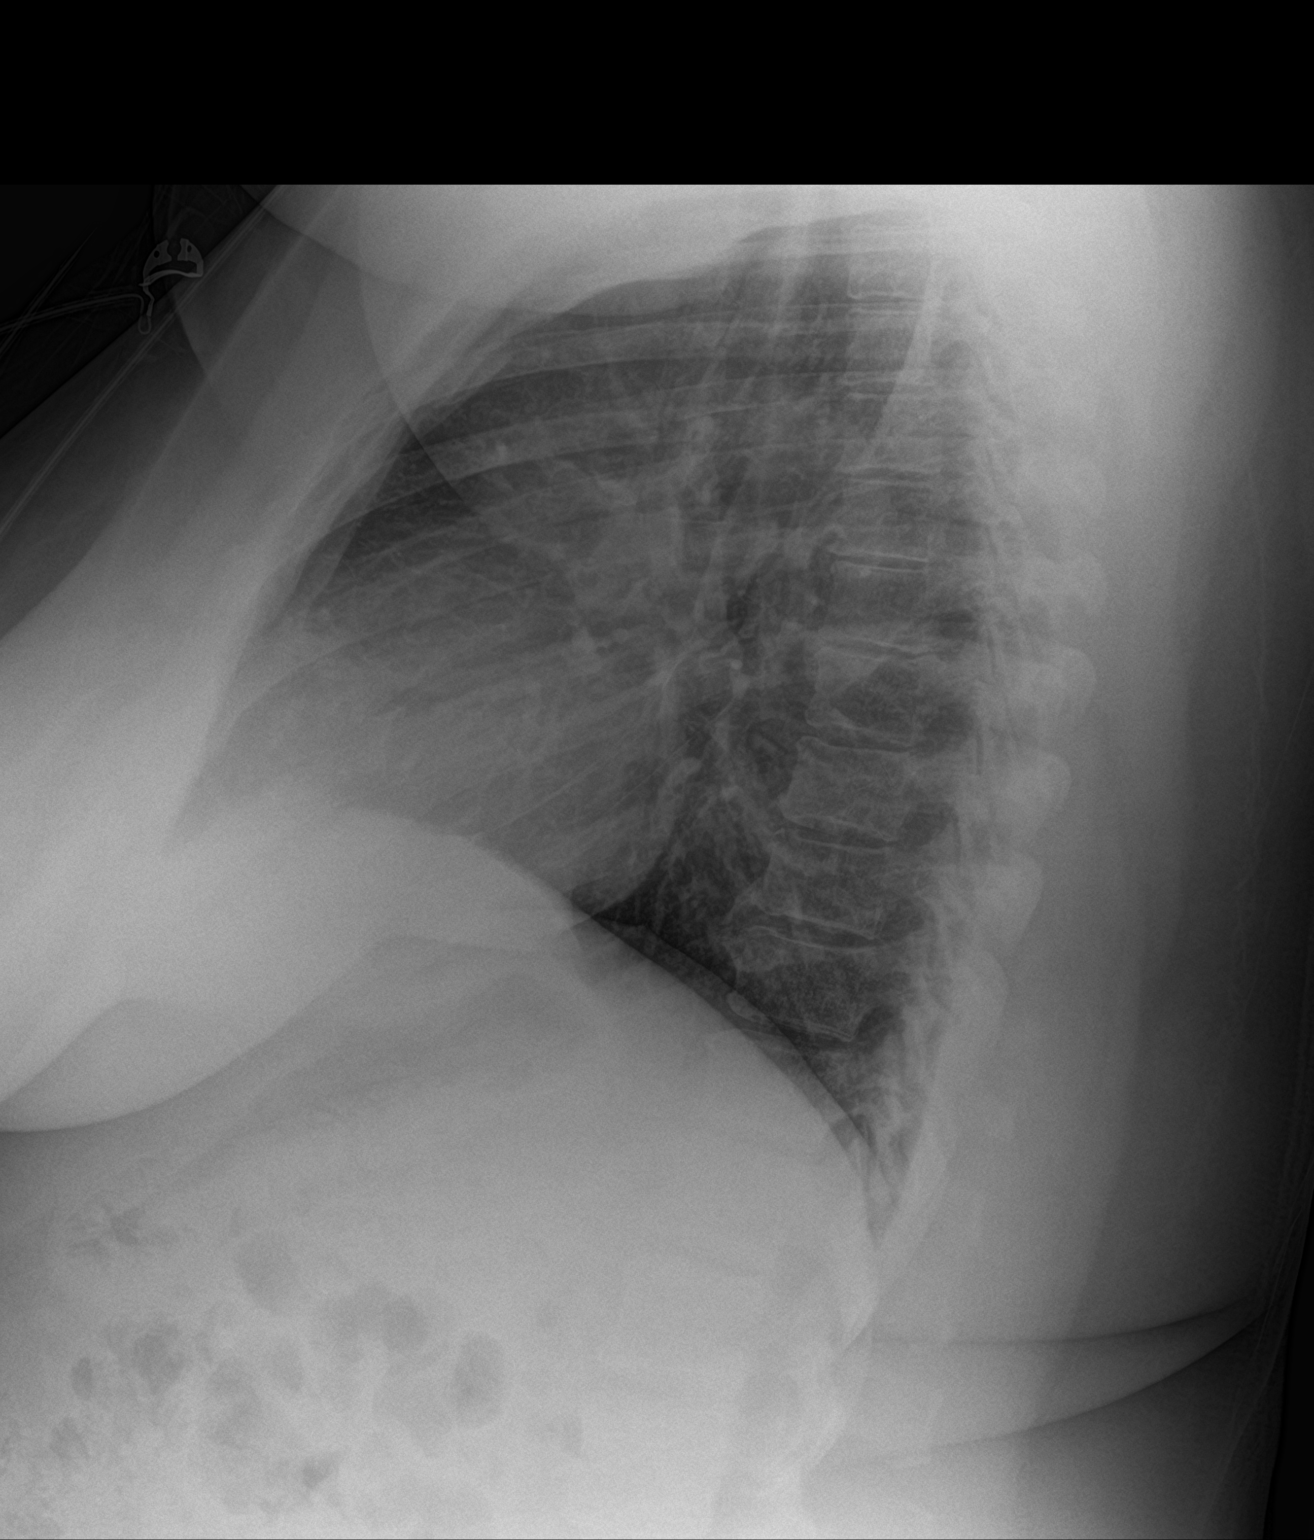

[2 of 2 positions shown; findings below may reference images not displayed]

FINDINGS: The cardiomediastinal silhouette is within normal limits. Mild
peribronchial thickening is again noted. No confluent airspace
opacity, edema, pleural effusion, or pneumothorax is identified.
Mild thoracic spondylosis is noted.
IMPRESSION: Mild bronchitic changes.

## 2019-02-04 ENCOUNTER — Emergency Department: Payer: BC Managed Care – PPO

## 2019-02-04 ENCOUNTER — Other Ambulatory Visit: Payer: Self-pay

## 2019-02-04 ENCOUNTER — Emergency Department
Admission: EM | Admit: 2019-02-04 | Discharge: 2019-02-04 | Disposition: A | Discharge status: Home / Self Care | Payer: BC Managed Care – PPO | Attending: Student in an Organized Health Care Education/Training Program | Admitting: Student in an Organized Health Care Education/Training Program

## 2019-02-04 DIAGNOSIS — Z79899 Other long term (current) drug therapy: Secondary | ICD-10-CM | POA: Diagnosis not present

## 2019-02-04 DIAGNOSIS — R142 Eructation: Secondary | ICD-10-CM | POA: Insufficient documentation

## 2019-02-04 DIAGNOSIS — I10 Essential (primary) hypertension: Secondary | ICD-10-CM | POA: Diagnosis not present

## 2019-02-04 DIAGNOSIS — Z7982 Long term (current) use of aspirin: Secondary | ICD-10-CM | POA: Diagnosis not present

## 2019-02-04 DIAGNOSIS — R079 Chest pain, unspecified: Secondary | ICD-10-CM | POA: Insufficient documentation

## 2019-02-04 DIAGNOSIS — F17210 Nicotine dependence, cigarettes, uncomplicated: Secondary | ICD-10-CM | POA: Insufficient documentation

## 2019-02-04 LAB — BASIC METABOLIC PANEL
Anion gap: 10 (ref 5–15)
BUN: 9 mg/dL (ref 6–20)
CO2: 26 mmol/L (ref 22–32)
Calcium: 9.2 mg/dL (ref 8.9–10.3)
Chloride: 102 mmol/L (ref 98–111)
Creatinine, Ser: 0.75 mg/dL (ref 0.44–1.00)
GFR calc Af Amer: >60 mL/min (ref >60)
GFR calc non Af Amer: >60 mL/min (ref >60)
Glucose, Bld: 132 mg/dL — ABNORMAL HIGH (ref 70–99)
Potassium: 3.7 mmol/L (ref 3.5–5.1)
Sodium: 138 mmol/L (ref 135–145)

## 2019-02-04 LAB — CBC
HCT: 41.9 % (ref 36.0–46.0)
Hemoglobin: 13.1 g/dL (ref 12.0–15.0)
MCH: 25.9 pg — ABNORMAL LOW (ref 26.0–34.0)
MCHC: 31.3 g/dL (ref 30.0–36.0)
MCV: 83 fL (ref 80.0–100.0)
Platelets: 329 10*3/uL (ref 150–400)
RBC: 5.05 MIL/uL (ref 3.87–5.11)
RDW: 14.3 % (ref 11.5–15.5)
WBC: 7.4 10*3/uL (ref 4.0–10.5)
nRBC: 0 % (ref 0.0–0.2)

## 2019-02-04 LAB — TROPONIN I (HIGH SENSITIVITY)
Troponin I (High Sensitivity): 6 ng/L (ref <18)
Troponin I (High Sensitivity): 5 ng/L (ref <18)

## 2019-02-04 MED ORDER — OMEPRAZOLE 20 MG PO CPDR: 20.0000 mg | DELAYED_RELEASE_CAPSULE | Freq: Every day | ORAL | 0 refills | Status: AC | PRN

## 2019-02-04 MED ORDER — ALUM & MAG HYDROXIDE-SIMETH 200-200-20 MG/5ML PO SUSP
30.0000 mL | Freq: Once | ORAL | Status: AC
  Administered 2019-02-04: 30 mL via ORAL
  Filled 2019-02-04: qty 30

## 2019-02-04 MED ORDER — PANTOPRAZOLE SODIUM 40 MG PO TBEC
40.0000 mg | DELAYED_RELEASE_TABLET | Freq: Every day | ORAL | Status: DC
  Administered 2019-02-04: 17:00:00 40 mg via ORAL
  Filled 2019-02-04: qty 1

## 2019-02-04 MED ORDER — SUCRALFATE 1 G PO TABS: 1.0000 g | ORAL_TABLET | Freq: Three times a day (TID) | ORAL | 0 refills | Status: AC | PRN

## 2019-02-04 MED ORDER — LIDOCAINE VISCOUS HCL 2 % MT SOLN
15.0000 mL | Freq: Once | OROMUCOSAL | Status: AC
  Administered 2019-02-04: 16:00:00 15 mL via ORAL
  Filled 2019-02-04: qty 15

## 2019-02-04 MED ORDER — SUCRALFATE 1 G PO TABS
1.0000 g | ORAL_TABLET | Freq: Once | ORAL | Status: AC
Start: 2019-02-04 — End: 2019-02-04
  Administered 2019-02-04: 17:00:00 1 g via ORAL
  Filled 2019-02-04 (×2): qty 1

## 2019-02-04 NOTE — ED Notes (Signed)
It says in chart that pt has a legal guardian. Chart does not say who the pt's legal guardian is and pt adamantly denies having a legal guardian, and was not even sure what that means. PT's only contacts in chart are spouse and daughter, neither of which have noted that they are a guardian of the pt.

## 2019-02-04 NOTE — ED Triage Notes (Signed)
Says left side chest pain for few days.  Says it makes her burp.  Not getting better.

## 2019-02-04 NOTE — ED Triage Notes (Addendum)
Reports left sided chest pain that radiates to left axilla and left shoulder X 3 days, nausea and belching. Reports mild diarrhea as well. Pt alert and oriented X4, cooperative, RR even and unlabored, color WNL. Pt in NAD.  Pt states that she took her BP medications just PTA.

## 2019-02-04 NOTE — ED Notes (Signed)
Patient still in lobby.  Explained wait and that she is going to main side ED rather than flex due to chest pain. Patient understanding

## 2019-02-04 NOTE — ED Provider Notes (Signed)
[facility] Emergency Department Provider Note    First MD Initiated Contact with Patient 02/04/19 1636     (approximate)  I have reviewed the triage vital signs and the nursing notes.   HISTORY  Chief Complaint Chest Pain    HPI [name] is a 53 y.o. female presents the ER for 3 days of midsternal nonradiating chest discomfort with frequent belching.  Symptoms started after she ate some food over the weekend.  Denies any diarrhea.  No nausea or vomiting.  No diaphoresis.  No shortness of breath.  States the pain is worsened after eating and laying flat.  Does have a history of reflux.  Has not taken in her antiacid medications.  Denies any pain with deep inspiration.  No exertional pain or discomfort.    Past Medical History:  Diagnosis Date  . Chest pain    negative Myoview march 2009  . HTN (hypertension)    controlled  . Lipoma    Hx-left leg. s/p excision  . Obesity   . OSA (obstructive sleep apnea)    mild  . Palpitation    event monito 2/10  . Rectal bleeding 5/10   normal colonoscopy. Dr. Deatra Ina  . Yeast infection    Family History  Problem Relation Age of Onset  . Allergies Sister        and daughter  . Allergy (severe) Sister   . Heart attack Sister   . Asthma Daughter   . Allergy (severe) Daughter   . Coronary artery disease Other        female 1st degree relative <60/ female 1st degree <50  . Stroke Other        female 1st degree relatvie <50  . Heart disease Mother   . Heart attack Mother   . Other Mother        rheumatism  . Heart disease Father   . Heart disease Paternal Grandfather   . Heart attack Paternal Grandfather   . Stroke Paternal Grandfather   . Cancer Paternal Grandfather        prostate  . Prostate cancer Maternal Grandfather   . Other Maternal Grandmother        rheumatism   Past Surgical History:  Procedure Laterality Date  . ABDOMINAL HYSTERECTOMY    . hysterectomy unspecified area      Patient Active Problem List   Diagnosis Date Noted  . Chest pain 09/03/2016  . Viral URI 01/27/2014  . Hip pain 01/27/2014  . Vaginitis and vulvovaginitis 01/27/2014  . Other dysphagia 11/09/2013  . Thyroid nodule 11/09/2013  . Rhinitis 10/26/2013  . Nasal lesion 04/16/2013  . Routine general medical examination at a health care facility 08/25/2012  . DIABETES MELLITUS, TYPE II 07/19/2008  . HYPERLIPIDEMIA 07/19/2008  . ALLERGIC RHINITIS 07/19/2008  . GERD 07/19/2008  . PALPITATIONS 07/07/2008  . OBSTRUCTIVE SLEEP APNEA 06/14/2008  . OBESITY, MORBID 05/25/2008  . Essential hypertension 05/25/2008  . CHEST PAIN, ATYPICAL 05/25/2008      Prior to Admission medications   Medication Sig Start Date End Date Taking? Authorizing Provider  albuterol (PROVENTIL HFA;VENTOLIN HFA) 108 (90 Base) MCG/ACT inhaler Inhale 2 puffs into the lungs every 4 (four) hours as needed for wheezing or shortness of breath. 09/04/16   Bettey Costa, MD  aspirin EC 81 MG tablet Take 81 mg by mouth daily.    [provider]  EPINEPHrine 0.3 mg/0.3 mL IJ SOAJ injection Inject 0.3 mg into the skin  daily as needed (allergies).  05/19/14   [provider]  fluticasone (FLONASE) 50 MCG/ACT nasal spray Place 2 sprays into both nostrils daily. 12/01/14   Ward, Layla Maw, DO  furosemide (LASIX) 40 MG tablet 1 tablet 1-2 times per day prn edema 12/29/16 02/06/17  Tommi Rumps, PA-C  HYDROcodone-acetaminophen (NORCO/VICODIN) 5-325 MG tablet Take 1 tablet by mouth every 6 (six) hours as needed for moderate pain. 12/29/16   Tommi Rumps, PA-C  levofloxacin (LEVAQUIN) 750 MG tablet Take 1 tablet (750 mg total) by mouth daily. 09/04/16   Adrian Saran, MD  metFORMIN (GLUCOPHAGE-XR) 500 MG 24 hr tablet 2 tabs by mouth once daily. Patient taking differently: Take 1,000 mg by mouth daily.  11/25/12   Sandford Craze, NP  nebivolol (BYSTOLIC) 5 MG tablet Take 1 tablet (5 mg total) by mouth daily. 09/04/16    Adrian Saran, MD  omeprazole (PRILOSEC) 20 MG capsule Take 1 capsule (20 mg total) by mouth daily as needed (FOR ACID REFLUX). 02/04/19   Willy Eddy, MD  penicillin v potassium (VEETID) 500 MG tablet Take 1 tablet (500 mg total) by mouth 4 (four) times daily. 12/29/16   Tommi Rumps, PA-C  potassium chloride (MICRO-K) 10 MEQ CR capsule Take 1 capsule (10 mEq total) by mouth daily. 09/04/16   Adrian Saran, MD  sucralfate (CARAFATE) 1 g tablet Take 1 tablet (1 g total) by mouth 3 (three) times daily as needed. 02/04/19 02/04/20  Willy Eddy, MD    Allergies Sulfonamide derivatives    Social History Social History   Tobacco Use  . Smoking status: Current Some Day Smoker  . Smokeless tobacco: Never Used  Substance Use Topics  . Alcohol use: Yes    Comment: occasional  . Drug use: No    Review of Systems Patient denies headaches, rhinorrhea, blurry vision, numbness, shortness of breath, chest pain, edema, cough, abdominal pain, nausea, vomiting, diarrhea, dysuria, fevers, rashes or hallucinations unless otherwise stated above in HPI. ____________________________________________   PHYSICAL EXAM:  VITAL SIGNS: Vitals:   02/04/19 1041 02/04/19 1510  BP: (!) 166/109 (!) 160/86  Pulse: 73 64  Resp: 16 18  Temp: 98.2 F (36.8 C)   SpO2: 98% 99%    Constitutional: Alert and oriented.  Eyes: Conjunctivae are normal.  Head: Atraumatic. Nose: No congestion/rhinnorhea. Mouth/Throat: Mucous membranes are moist.   Neck: No stridor. Painless ROM.  Cardiovascular: Normal rate, regular rhythm. Grossly normal heart sounds.  Good peripheral circulation. Respiratory: Normal respiratory effort.  No retractions. Lungs CTAB. Gastrointestinal: Soft, obese and nontender in all four quadrants. No distention. No abdominal bruits. No CVA tenderness. Genitourinary:  Musculoskeletal: No lower extremity tenderness nor edema.  No joint effusions. Neurologic:  Normal speech and  language. No gross focal neurologic deficits are appreciated. No facial droop Skin:  Skin is warm, dry and intact. No rash noted. Psychiatric: Mood and affect are normal. Speech and behavior are normal.  ____________________________________________   LABS (all labs ordered are listed, but only abnormal results are displayed)  Results for orders placed or performed during the hospital encounter of 02/04/19 (from the past 24 hour(s))  Basic metabolic panel     Status: Abnormal   Collection Time: 02/04/19 10:44 AM  Result Value Ref Range   Sodium 138 135 - 145 mmol/L   Potassium 3.7 3.5 - 5.1 mmol/L   Chloride 102 98 - 111 mmol/L   CO2 26 22 - 32 mmol/L   Glucose, Bld 132 (H) 70 - 99  mg/dL   BUN 9 6 - 20 mg/dL   Creatinine, Ser 4.540.75 0.44 - 1.00 mg/dL   Calcium 9.2 8.9 - 09.810.3 mg/dL   GFR calc non Af Amer >60 >60 mL/min   GFR calc Af Amer >60 >60 mL/min   Anion gap 10 5 - 15  CBC     Status: Abnormal   Collection Time: 02/04/19 10:44 AM  Result Value Ref Range   WBC 7.4 4.0 - 10.5 K/uL   RBC 5.05 3.87 - 5.11 MIL/uL   Hemoglobin 13.1 12.0 - 15.0 g/dL   HCT 11.941.9 14.736.0 - 82.946.0 %   MCV 83.0 80.0 - 100.0 fL   MCH 25.9 (L) 26.0 - 34.0 pg   MCHC 31.3 30.0 - 36.0 g/dL   RDW 56.214.3 13.011.5 - 86.515.5 %   Platelets 329 150 - 400 K/uL   nRBC 0.0 0.0 - 0.2 %  Troponin I (High Sensitivity)     Status: None   Collection Time: 02/04/19 10:44 AM  Result Value Ref Range   Troponin I (High Sensitivity) 6 <18 ng/L  Troponin I (High Sensitivity)     Status: None   Collection Time: 02/04/19  3:10 PM  Result Value Ref Range   Troponin I (High Sensitivity) 5 <18 ng/L   ____________________________________________  EKG My review and personal interpretation at Time: 10:45   Indication: chest pain  Rate: 70  Rhythm: sinus Axis: normal Other: normal intervals, no stemi ____________________________________________  RADIOLOGY  I personally reviewed all radiographic images ordered to evaluate for the above  acute complaints and reviewed radiology reports and findings.  These findings were personally discussed with the patient.  Please see medical record for radiology report.  ____________________________________________   PROCEDURES  Procedure(s) performed:  Procedures    Critical Care performed: no ____________________________________________   INITIAL IMPRESSION / ASSESSMENT AND PLAN / ED COURSE  Pertinent labs & imaging results that were available during my care of the patient were reviewed by me and considered in my medical decision making (see chart for details).   DDX: Gastritis, reflux, esophagitis, pneumonia, dissection, ACS  Ferne CoeKathy L Vanderbeck is a 53 y.o. who presents to the ED with symptoms as described above.  Patient nontoxic-appearing having several days of similar symptoms.  EKG is nonischemic.  Troponin profile is negative.  Patient with a heart score of 3.  Does not seem consistent with ACS.  Not consistent with PE or dissection.  Abdominal exam is soft benign.  Had significant improvement after GI cocktail.  Suspect this is related to reflux and heartburn.  Will start on antacid medication.  Discussed follow-up with PCP.     The patient was evaluated in Emergency Department today for the symptoms described in the history of present illness. He/she was evaluated in the context of the global COVID-19 pandemic, which necessitated consideration that the patient might be at risk for infection with the SARS-CoV-2 virus that causes COVID-19. Institutional protocols and algorithms that pertain to the evaluation of patients at risk for COVID-19 are in a state of rapid change based on information released by regulatory bodies including the CDC and federal and state organizations. These policies and algorithms were followed during the patient's care in the ED.  As part of my medical decision making, I reviewed the following data within the electronic MEDICAL RECORD NUMBER Nursing notes  reviewed and incorporated, Labs reviewed, notes from prior ED visits and [place] Controlled Substance Database   ____________________________________________   FINAL CLINICAL IMPRESSION(S) / ED  DIAGNOSES  Final diagnoses:  Chest pain, unspecified type  Belching      NEW MEDICATIONS STARTED DURING THIS VISIT:  New Prescriptions   SUCRALFATE (CARAFATE) 1 G TABLET    Take 1 tablet (1 g total) by mouth 3 (three) times daily as needed.     Note:  This document was prepared using Dragon voice recognition software and may include unintentional dictation errors.    Willy Eddy, MD 02/04/19 [phone]

## 2019-04-13 ENCOUNTER — Ambulatory Visit: Payer: Self-pay | Admitting: *Deleted

## 2019-04-13 NOTE — Telephone Encounter (Signed)
Pt called due to exposure to COVID. Has questions about testing process and results.   Pt called about being exposed, someone here had already set up appt for COVID test tomorrow. Pt does not want a visit with provider even if virtual. Pt's questions were about testing. Reviewed safe practices. Reason for Disposition . [1] CLOSE CONTACT COVID-19 EXPOSURE within last 14 days AND [2] NO symptoms  Answer Assessment - Initial Assessment Questions 1. COVID-19 CLOSE CONTACT: "Who is the person with the confirmed or suspected COVID-19 infection that you were exposed to?"     self 2. PLACE of CONTACT: "Where were you when you were exposed to COVID-19?" (e.g., home, school, medical waiting room; which city?)     At car lot 3. TYPE of CONTACT: "How much contact was there?" (e.g., sitting next to, live in same house, work in same office, same building)     In office 4. DURATION of CONTACT: "How long were you in contact with the COVID-19 patient?" (e.g., a few seconds, passed by person, a few minutes, 15 minutes or longer, live with the patient)     30 5. MASK: "Were you wearing a mask?" "Was the other person wearing a mask?" Note: wearing a mask reduces the risk of an  otherwise close contact.     yes 6. DATE of CONTACT: "When did you have contact with a COVID-19 patient?" (e.g., how many days ago)     5 7. COMMUNITY SPREAD: "Are there lots of cases of COVID-19 (community spread) where you live?" (See public health department website, if unsure)       no 8. SYMPTOMS: "Do you have any symptoms?" (e.g., fever, cough, breathing difficulty, loss of taste or smell)     Headache and cold 9. PREGNANCY OR POSTPARTUM: "Is there any chance you are pregnant?" "When was your last menstrual period?" "Did you deliver in the last 2 weeks?"     no 10. HIGH RISK: "Do you have any heart or lung problems? Do you have a weak immune system?" (e.g., heart failure, COPD, asthma, HIV positive, chemotherapy, renal failure,  diabetes mellitus, sickle cell anemia, obesity)       HTN, DM 11.  TRAVEL: "Have you traveled out of the country recently?" If so, "When and where?"  Also ask about out-of-state travel, since the CDC has identified some high-risk cities for community spread in the [place].  Note: Travel becomes less relevant if there is widespread community transmission where the patient lives.       no  Protocols used: CORONAVIRUS (COVID-19) EXPOSURE-A-AH

## 2019-04-14 ENCOUNTER — Ambulatory Visit: Payer: BC Managed Care – PPO | Attending: Internal Medicine

## 2019-04-14 DIAGNOSIS — Z20822 Contact with and (suspected) exposure to covid-19: Secondary | ICD-10-CM

## 2019-04-16 ENCOUNTER — Ambulatory Visit: Payer: Self-pay | Admitting: *Deleted

## 2019-04-16 LAB — NOVEL CORONAVIRUS, NAA: SARS-CoV-2, NAA: NOT DETECTED

## 2019-04-16 NOTE — Telephone Encounter (Signed)
Patient was calling for COVID test result- negative. Patient is complaining of chest pain, sore throat.  Patient was exposed last Saturday- patient had diarrhea and low grade fever. Patient had used Nasonex. The day of COVID test and she tested only days after exposure- not waiting 5-6 days. Advised patient her result may be correct- it is flu season also.   Reason for Disposition . Chest pain or pressure  Answer Assessment - Initial Assessment Questions 1. COVID-19 CLOSE CONTACT: "Who is the person with the confirmed or suspected COVID-19 infection that you were exposed to?"    coworker 2. PLACE of CONTACT: "Where were you when you were exposed to COVID-19?" (e.g., home, school, medical waiting room; which city?)     work 3. TYPE of CONTACT: "How much contact was there?" (e.g., sitting next to, live in same house, work in same office, same building)     Work at same dealership 4. DURATION of CONTACT: "How long were you in contact with the COVID-19 patient?" (e.g., a few seconds, passed by person, a few minutes, 15 minutes or longer, live with the patient)     Not asked 5. MASK: "Were you wearing a mask?" "Was the other person wearing a mask?" Note: wearing a mask reduces the risk of an  otherwise close contact.     Not asked 6. DATE of CONTACT: "When did you have contact with a COVID-19 patient?" (e.g., how many days ago)     Saturday 7. COMMUNITY SPREAD: "Are there lots of cases of COVID-19 (community spread) where you live?" (See public health department website, if unsure)       yes 8. SYMPTOMS: "Do you have any symptoms?" (e.g., fever, cough, breathing difficulty, loss of taste or smell)     Diarrhea, low grade temperature, chest discomfort, sore throat  Protocols used: CORONAVIRUS (COVID-19) DIAGNOSED OR SUSPECTED-A-AH, CORONAVIRUS (COVID-19) EXPOSURE-A-AH

## 2019-09-24 ENCOUNTER — Emergency Department
Admission: EM | Admit: 2019-09-24 | Discharge: 2019-09-24 | Disposition: A | Discharge status: Home / Self Care | Payer: BC Managed Care – PPO | Attending: Emergency Medicine | Admitting: Emergency Medicine

## 2019-09-24 ENCOUNTER — Encounter: Payer: Self-pay | Admitting: Emergency Medicine

## 2019-09-24 ENCOUNTER — Other Ambulatory Visit: Payer: Self-pay

## 2019-09-24 ENCOUNTER — Emergency Department: Payer: BC Managed Care – PPO

## 2019-09-24 DIAGNOSIS — I10 Essential (primary) hypertension: Secondary | ICD-10-CM | POA: Diagnosis not present

## 2019-09-24 DIAGNOSIS — M79605 Pain in left leg: Secondary | ICD-10-CM | POA: Diagnosis not present

## 2019-09-24 DIAGNOSIS — R202 Paresthesia of skin: Secondary | ICD-10-CM | POA: Diagnosis not present

## 2019-09-24 DIAGNOSIS — R609 Edema, unspecified: Secondary | ICD-10-CM | POA: Insufficient documentation

## 2019-09-24 DIAGNOSIS — M25552 Pain in left hip: Secondary | ICD-10-CM | POA: Diagnosis not present

## 2019-09-24 DIAGNOSIS — M79602 Pain in left arm: Secondary | ICD-10-CM | POA: Diagnosis not present

## 2019-09-24 DIAGNOSIS — Z79899 Other long term (current) drug therapy: Secondary | ICD-10-CM | POA: Insufficient documentation

## 2019-09-24 DIAGNOSIS — R0789 Other chest pain: Secondary | ICD-10-CM | POA: Diagnosis not present

## 2019-09-24 DIAGNOSIS — Z7984 Long term (current) use of oral hypoglycemic drugs: Secondary | ICD-10-CM | POA: Diagnosis not present

## 2019-09-24 DIAGNOSIS — E119 Type 2 diabetes mellitus without complications: Secondary | ICD-10-CM | POA: Diagnosis not present

## 2019-09-24 DIAGNOSIS — Z7982 Long term (current) use of aspirin: Secondary | ICD-10-CM | POA: Insufficient documentation

## 2019-09-24 DIAGNOSIS — R0602 Shortness of breath: Secondary | ICD-10-CM | POA: Diagnosis not present

## 2019-09-24 DIAGNOSIS — F17200 Nicotine dependence, unspecified, uncomplicated: Secondary | ICD-10-CM | POA: Insufficient documentation

## 2019-09-24 DIAGNOSIS — M79604 Pain in right leg: Secondary | ICD-10-CM | POA: Insufficient documentation

## 2019-09-24 LAB — BASIC METABOLIC PANEL
Anion gap: 10 (ref 5–15)
BUN: 13 mg/dL (ref 6–20)
CO2: 24 mmol/L (ref 22–32)
Calcium: 9 mg/dL (ref 8.9–10.3)
Chloride: 105 mmol/L (ref 98–111)
Creatinine, Ser: 0.93 mg/dL (ref 0.44–1.00)
GFR calc Af Amer: >60 mL/min (ref >60)
GFR calc non Af Amer: >60 mL/min (ref >60)
Glucose, Bld: 135 mg/dL — ABNORMAL HIGH (ref 70–99)
Potassium: 3.8 mmol/L (ref 3.5–5.1)
Sodium: 139 mmol/L (ref 135–145)

## 2019-09-24 LAB — CBC
HCT: 41.4 % (ref 36.0–46.0)
Hemoglobin: 13.2 g/dL (ref 12.0–15.0)
MCH: 26.3 pg (ref 26.0–34.0)
MCHC: 31.9 g/dL (ref 30.0–36.0)
MCV: 82.5 fL (ref 80.0–100.0)
Platelets: 303 10*3/uL (ref 150–400)
RBC: 5.02 MIL/uL (ref 3.87–5.11)
RDW: 14 % (ref 11.5–15.5)
WBC: 8.7 10*3/uL (ref 4.0–10.5)
nRBC: 0 % (ref 0.0–0.2)

## 2019-09-24 LAB — TROPONIN I (HIGH SENSITIVITY): Troponin I (High Sensitivity): 7 ng/L (ref <18)

## 2019-09-24 MED ORDER — FUROSEMIDE 10 MG/ML IJ SOLN
40.0000 mg | Freq: Once | INTRAMUSCULAR | Status: AC
  Administered 2019-09-24: 40 mg via INTRAVENOUS
  Filled 2019-09-24: qty 4

## 2019-09-24 MED ORDER — SODIUM CHLORIDE 0.9% FLUSH: 3.0000 mL | Freq: Once | INTRAVENOUS | Status: DC

## 2019-09-24 MED ORDER — FUROSEMIDE 40 MG PO TABS
40.0000 mg | ORAL_TABLET | Freq: Every day | ORAL | 0 refills | Status: AC
Start: 2019-09-24 — End: 2019-10-09

## 2019-09-24 NOTE — ED Notes (Addendum)
See triage note   Presents with pain to left hip moving into left lower leg  Also has had some issues swelling to same leg  States she has family hx of DVT's

## 2019-09-24 NOTE — ED Provider Notes (Signed)
[facility] Emergency Department Provider Note ____________________________________________   First MD Initiated Contact with Patient 09/24/19 1454     (approximate)  I have reviewed the triage vital signs and the nursing notes.   HISTORY  Chief Complaint Leg Pain and Leg Swelling    HPI ADEA Morse is a 54 y.o. female with PMH as noted below including hypertension and chronic edema who presents with pain and swelling to her legs, gradual onset, somewhat worse in the last week, and mainly in the left leg.  The patient states that she had been off of Lasix for a while but recently restarted, at 40 mg/day.  She reports improvement in her swelling and pain, but states the left side is persistently swollen and painful and she is worried about a blood clot there.  She states that she initially had some shortness of breath before restarting the Lasix, but this has resolved.  She denies any chest pain, fever or chills, vomiting or diarrhea, or any acute trauma to the leg.  She reports some pain throughout the left side of her body, but no weakness.  She states that she has had similar symptoms in the past with edema.   Past Medical History:  Diagnosis Date  . Chest pain    negative Myoview march 2009  . HTN (hypertension)    controlled  . Lipoma    Hx-left leg. s/p excision  . Obesity   . OSA (obstructive sleep apnea)    mild  . Palpitation    event [place] 2/10  . Rectal bleeding 5/10   normal colonoscopy. Dr. Arlyce Dice  . Yeast infection     Patient Active Problem List   Diagnosis Date Noted  . Chest pain 09/03/2016  . Viral URI 01/27/2014  . Hip pain 01/27/2014  . Vaginitis and vulvovaginitis 01/27/2014  . Other dysphagia 11/09/2013  . Thyroid nodule 11/09/2013  . Rhinitis 10/26/2013  . Nasal lesion 04/16/2013  . Routine general medical examination at a health care facility 08/25/2012  . DIABETES MELLITUS, TYPE II 07/19/2008  . HYPERLIPIDEMIA  07/19/2008  . ALLERGIC RHINITIS 07/19/2008  . GERD 07/19/2008  . PALPITATIONS 07/07/2008  . OBSTRUCTIVE SLEEP APNEA 06/14/2008  . OBESITY, MORBID 05/25/2008  . Essential hypertension 05/25/2008  . CHEST PAIN, ATYPICAL 05/25/2008    Past Surgical History:  Procedure Laterality Date  . ABDOMINAL HYSTERECTOMY    . hysterectomy unspecified area      Prior to Admission medications   Medication Sig Start Date End Date Taking? Authorizing Provider  albuterol (PROVENTIL HFA;VENTOLIN HFA) 108 (90 Base) MCG/ACT inhaler Inhale 2 puffs into the lungs every 4 (four) hours as needed for wheezing or shortness of breath. 09/04/16   Adrian Saran, MD  aspirin EC 81 MG tablet Take 81 mg by mouth daily.    [provider]  EPINEPHrine 0.3 mg/0.3 mL IJ SOAJ injection Inject 0.3 mg into the skin daily as needed (allergies).  05/19/14   [provider]  fluticasone (FLONASE) 50 MCG/ACT nasal spray Place 2 sprays into both nostrils daily. 12/01/14   Ward, Layla Maw, DO  furosemide (LASIX) 40 MG tablet Take 1 tablet (40 mg total) by mouth daily for 15 days. 09/24/19 10/09/19  Dionne Bucy, MD  HYDROcodone-acetaminophen (NORCO/VICODIN) 5-325 MG tablet Take 1 tablet by mouth every 6 (six) hours as needed for moderate pain. 12/29/16   Tommi Rumps, PA-C  levofloxacin (LEVAQUIN) 750 MG tablet Take 1 tablet (750 mg total) by mouth daily.  09/04/16   Adrian Saran, MD  metFORMIN (GLUCOPHAGE-XR) 500 MG 24 hr tablet 2 tabs by mouth once daily. Patient taking differently: Take 1,000 mg by mouth daily.  11/25/12   Sandford Craze, NP  nebivolol (BYSTOLIC) 5 MG tablet Take 1 tablet (5 mg total) by mouth daily. 09/04/16   Adrian Saran, MD  omeprazole (PRILOSEC) 20 MG capsule Take 1 capsule (20 mg total) by mouth daily as needed (FOR ACID REFLUX). 02/04/19   Willy Eddy, MD  penicillin v potassium (VEETID) 500 MG tablet Take 1 tablet (500 mg total) by mouth 4 (four) times daily. 12/29/16   Tommi Rumps, PA-C  potassium chloride (MICRO-K) 10 MEQ CR capsule Take 1 capsule (10 mEq total) by mouth daily. 09/04/16   Adrian Saran, MD  sucralfate (CARAFATE) 1 g tablet Take 1 tablet (1 g total) by mouth 3 (three) times daily as needed. 02/04/19 02/04/20  Willy Eddy, MD    Allergies Sulfonamide derivatives  Family History  Problem Relation Age of Onset  . Allergies Sister        and daughter  . Allergy (severe) Sister   . Heart attack Sister   . Asthma Daughter   . Allergy (severe) Daughter   . Coronary artery disease Other        female 1st degree relative <60/ female 1st degree <50  . Stroke Other        female 1st degree relatvie <50  . Heart disease Mother   . Heart attack Mother   . Other Mother        rheumatism  . Heart disease Father   . Heart disease Paternal Grandfather   . Heart attack Paternal Grandfather   . Stroke Paternal Grandfather   . Cancer Paternal Grandfather        prostate  . Prostate cancer Maternal Grandfather   . Other Maternal Grandmother        rheumatism    Social History Social History   Tobacco Use  . Smoking status: Current Some Day Smoker  . Smokeless tobacco: Never Used  Substance Use Topics  . Alcohol use: Yes    Comment: occasional  . Drug use: No    Review of Systems  Constitutional: No fever/chills. Eyes: No visual changes. ENT: No sore throat. Cardiovascular: Denies chest pain. Respiratory: Denies shortness of breath. Gastrointestinal: No vomiting or diarrhea.  Genitourinary: Negative for dysuria.  Musculoskeletal: Negative for back pain.  Positive for left leg swelling. Skin: Negative for rash. Neurological: Negative for headaches, focal weakness or numbness.   ____________________________________________   PHYSICAL EXAM:  VITAL SIGNS: ED Triage Vitals  Enc Vitals Group     BP 09/24/19 1248 (!) 154/74     Pulse Rate 09/24/19 1248 87     Resp 09/24/19 1248 16     Temp 09/24/19 1248 98.9 F (37.2 C)      Temp Source 09/24/19 1248 Oral     SpO2 09/24/19 1248 98 %     Weight 09/24/19 1245 (!) 305 lb (138.3 kg)     Height 09/24/19 1245 5\' 5"  (1.651 m)     Head Circumference --      Peak Flow --      Pain Score 09/24/19 1250 7     Pain Loc --      Pain Edu? --      Excl. in GC? --     Constitutional: Alert and oriented. Well appearing and in no acute distress. Eyes: Conjunctivae are  normal.  Head: Atraumatic. Nose: No congestion/rhinnorhea. Mouth/Throat: Mucous membranes are moist.   Neck: Normal range of motion.  Cardiovascular: Normal rate, regular rhythm.   Good peripheral circulation. Respiratory: Normal respiratory effort.  No retractions. Lungs CTAB. Gastrointestinal: No distention.  Mild lower abdominal wall edema. Genitourinary: No flank tenderness. Musculoskeletal: Trace bilateral lower extremity edema, slightly worse on the left.  Patient has an area of swelling/subcutaneous fluid in the region of an old lipoma excision scar in the left medial calf which she states is chronic.  Extremities warm and well perfused.  Neurologic:  Normal speech and language.  Motor and sensory intact in all extremities.   Skin:  Skin is warm and dry. No rash noted. Psychiatric: Mood and affect are normal. Speech and behavior are normal.  ____________________________________________   LABS (all labs ordered are listed, but only abnormal results are displayed)  Labs Reviewed  BASIC METABOLIC PANEL - Abnormal; Notable for the following components:      Result Value   Glucose, Bld 135 (*)    All other components within normal limits  CBC  TROPONIN I (HIGH SENSITIVITY)   ____________________________________________  EKG  ED ECG REPORT I, Dionne Bucy, the attending physician, personally viewed and interpreted this ECG.  Date: 09/24/2019 EKG Time: 1307 Rate: 70 Rhythm: normal sinus rhythm QRS Axis: normal Intervals: normal ST/T Wave abnormalities: normal Narrative  Interpretation: no evidence of acute ischemia  ____________________________________________  RADIOLOGY  CXR: No focal infiltrate or edema US venous LLE: No acute DVT  ____________________________________________   PROCEDURES  Procedure(s) performed: No  Procedures  Critical Care performed: No ____________________________________________   INITIAL IMPRESSION / ASSESSMENT AND PLAN / ED COURSE  Pertinent labs & imaging results that were available during my care of the patient were reviewed by me and considered in my medical decision making (see chart for details).  53 year old female with PMH as noted above presents with bilateral lower extremity swelling which she states is worse on the left and associated with some pain.  She reports chronic lower extremity and abdominal wall edema although she has no history of CHF.  She takes Lasix 40 mg daily currently.  She had some shortness of breath a few weeks ago but denies it now.  I reviewed the past medical records in Epic.  The patient was last seen in the ED in October of last year for chest discomfort and belching.  It was thought to be related to GERD, and the patient had a negative work-up.  She has no documented history of CHF.  On exam, the patient is overall very well-appearing.  Her vital signs are normal except for hypertension.  O2 saturation is in the high 90s on room air and she has no increased work of breathing.  She has very mild edema to bilateral lower extremities which appear slightly worse on the left especially in the distal left leg.  She also has an area of localized swelling and subcutaneous fluid in the vicinity of an old lipoma excision scar in the left calf, but states that this is chronic and unchanged.  Initial work-up obtained from triage is unremarkable.  The chest x-ray shows no edema.  Basic labs, troponin, and EKG are all unremarkable.  Overall I suspect chronic peripheral dependent edema which may be  slightly exacerbated, and the patient may have some decreased lymphatic drainage from the left leg after the old lipoma excision.  Differential also includes DVT.  There is no clinical evidence for acute CHF.  We will obtain an ultrasound of the left lower extremity and give a dose of IV Lasix.  If the ultrasound is negative I anticipate discharge home with routine PMD follow-up.  ----------------------------------------- 6:25 PM on 09/24/2019 -----------------------------------------  The patient reported already having improvement with the lasix and requested a second dose.  I recommended she continue her current dose at home; she stated she was almost out of it so I prescribed a 2 week supply to cover until she can follow up with her PMD.  DVT study is negative.  At this time, the patient is stable for discharge home.  I counseled her on the results of the workup.  I gave thorough return precautions and she expressed understanding.   ____________________________________________   FINAL CLINICAL IMPRESSION(S) / ED DIAGNOSES  Final diagnoses:  Peripheral edema      NEW MEDICATIONS STARTED DURING THIS VISIT:  Discharge Medication List as of 09/24/2019  6:27 PM       Note:  This document was prepared using Dragon voice recognition software and may include unintentional dictation errors.    Arta Silence, MD 09/24/19 2110

## 2019-09-24 NOTE — ED Triage Notes (Signed)
Patient reports leg swelling and redness in bilateral legs. States pain in left leg. Also reports pain in left arm and intermittent SOB.

## 2019-09-24 NOTE — ED Notes (Signed)
Pt c/o left chest and left arm pain and tingling. Blood orders and EKG added.

## 2019-09-24 NOTE — Discharge Instructions (Addendum)
Return to the ER for new, worsening, or persistent severe swelling or leg pain, shortness of breath, chest pain, weakness or lightheadedness, fever, or any other new or worsening symptoms that concern you.  Continue taking 40 mg of Lasix daily until you follow-up with your doctor.

## 2019-12-20 ENCOUNTER — Encounter (HOSPITAL_BASED_OUTPATIENT_CLINIC_OR_DEPARTMENT_OTHER): Payer: Self-pay | Admitting: Emergency Medicine

## 2019-12-20 ENCOUNTER — Emergency Department (HOSPITAL_BASED_OUTPATIENT_CLINIC_OR_DEPARTMENT_OTHER)
Admission: EM | Admit: 2019-12-20 | Discharge: 2019-12-21 | Disposition: A | Discharge status: Home / Self Care | Payer: BC Managed Care – PPO | Attending: Emergency Medicine | Admitting: Emergency Medicine

## 2019-12-20 ENCOUNTER — Emergency Department (HOSPITAL_BASED_OUTPATIENT_CLINIC_OR_DEPARTMENT_OTHER): Payer: BC Managed Care – PPO

## 2019-12-20 ENCOUNTER — Other Ambulatory Visit: Payer: Self-pay

## 2019-12-20 DIAGNOSIS — Z86018 Personal history of other benign neoplasm: Secondary | ICD-10-CM | POA: Insufficient documentation

## 2019-12-20 DIAGNOSIS — E669 Obesity, unspecified: Secondary | ICD-10-CM | POA: Insufficient documentation

## 2019-12-20 DIAGNOSIS — Z7982 Long term (current) use of aspirin: Secondary | ICD-10-CM | POA: Diagnosis not present

## 2019-12-20 DIAGNOSIS — F17200 Nicotine dependence, unspecified, uncomplicated: Secondary | ICD-10-CM | POA: Diagnosis not present

## 2019-12-20 DIAGNOSIS — R079 Chest pain, unspecified: Secondary | ICD-10-CM | POA: Diagnosis present

## 2019-12-20 DIAGNOSIS — E119 Type 2 diabetes mellitus without complications: Secondary | ICD-10-CM | POA: Insufficient documentation

## 2019-12-20 DIAGNOSIS — Z79899 Other long term (current) drug therapy: Secondary | ICD-10-CM | POA: Diagnosis not present

## 2019-12-20 DIAGNOSIS — Z7984 Long term (current) use of oral hypoglycemic drugs: Secondary | ICD-10-CM | POA: Insufficient documentation

## 2019-12-20 DIAGNOSIS — I10 Essential (primary) hypertension: Secondary | ICD-10-CM | POA: Diagnosis not present

## 2019-12-20 DIAGNOSIS — K5900 Constipation, unspecified: Secondary | ICD-10-CM | POA: Diagnosis not present

## 2019-12-20 DIAGNOSIS — R0789 Other chest pain: Secondary | ICD-10-CM

## 2019-12-20 DIAGNOSIS — R05 Cough: Secondary | ICD-10-CM | POA: Diagnosis not present

## 2019-12-20 LAB — BASIC METABOLIC PANEL
Anion gap: 11 (ref 5–15)
BUN: 10 mg/dL (ref 6–20)
CO2: 25 mmol/L (ref 22–32)
Calcium: 9.2 mg/dL (ref 8.9–10.3)
Chloride: 103 mmol/L (ref 98–111)
Creatinine, Ser: 0.8 mg/dL (ref 0.44–1.00)
GFR calc Af Amer: >60 mL/min (ref >60)
GFR calc non Af Amer: >60 mL/min (ref >60)
Glucose, Bld: 115 mg/dL — ABNORMAL HIGH (ref 70–99)
Potassium: 3.9 mmol/L (ref 3.5–5.1)
Sodium: 139 mmol/L (ref 135–145)

## 2019-12-20 LAB — CBC
HCT: 42.6 % (ref 36.0–46.0)
Hemoglobin: 13.3 g/dL (ref 12.0–15.0)
MCH: 26.1 pg (ref 26.0–34.0)
MCHC: 31.2 g/dL (ref 30.0–36.0)
MCV: 83.7 fL (ref 80.0–100.0)
Platelets: 300 10*3/uL (ref 150–400)
RBC: 5.09 MIL/uL (ref 3.87–5.11)
RDW: 14 % (ref 11.5–15.5)
WBC: 9.1 10*3/uL (ref 4.0–10.5)
nRBC: 0 % (ref 0.0–0.2)

## 2019-12-20 LAB — TROPONIN I (HIGH SENSITIVITY): Troponin I (High Sensitivity): 4 ng/L (ref <18)

## 2019-12-20 NOTE — ED Triage Notes (Signed)
Reports chest pain for the last 4 days.  Now endorses frequent burping, and having pain in right upper back as well.  Reports a productive cough and well as nasal congestion.

## 2019-12-21 LAB — HEPATIC FUNCTION PANEL
ALT: 25 U/L (ref 0–44)
AST: 19 U/L (ref 15–41)
Albumin: 4 g/dL (ref 3.5–5.0)
Alkaline Phosphatase: 61 U/L (ref 38–126)
Bilirubin, Direct: 0.1 mg/dL (ref 0.0–0.2)
Indirect Bilirubin: 0.4 mg/dL (ref 0.3–0.9)
Total Bilirubin: 0.5 mg/dL (ref 0.3–1.2)
Total Protein: 7 g/dL (ref 6.5–8.1)

## 2019-12-21 LAB — LIPASE, BLOOD: Lipase: 24 U/L (ref 11–51)

## 2019-12-21 LAB — TROPONIN I (HIGH SENSITIVITY): Troponin I (High Sensitivity): 4 ng/L (ref <18)

## 2019-12-21 LAB — D-DIMER, QUANTITATIVE: D-Dimer, Quant: 0.33 ug/mL-FEU (ref 0.00–0.50)

## 2019-12-21 LAB — PREGNANCY, URINE: Preg Test, Ur: NEGATIVE

## 2019-12-21 MED ORDER — ALUM & MAG HYDROXIDE-SIMETH 200-200-20 MG/5ML PO SUSP
30.0000 mL | Freq: Once | ORAL | Status: AC
  Administered 2019-12-21: 30 mL via ORAL
  Filled 2019-12-21: qty 30

## 2019-12-21 MED ORDER — OMEPRAZOLE 20 MG PO CPDR: 20.0000 mg | DELAYED_RELEASE_CAPSULE | Freq: Every day | ORAL | 0 refills | Status: AC

## 2019-12-21 MED ORDER — PANTOPRAZOLE SODIUM 40 MG IV SOLR
40.0000 mg | Freq: Once | INTRAVENOUS | Status: AC
  Administered 2019-12-21: 40 mg via INTRAVENOUS
  Filled 2019-12-21: qty 40

## 2019-12-21 MED ORDER — ACETAMINOPHEN 325 MG PO TABS: 650.0000 mg | ORAL_TABLET | Freq: Once | ORAL | Status: AC

## 2019-12-21 MED ORDER — ACETAMINOPHEN 325 MG PO TABS
ORAL_TABLET | ORAL | Status: AC
  Administered 2019-12-21: 650 mg via ORAL
  Filled 2019-12-21: qty 2

## 2019-12-21 NOTE — ED Provider Notes (Signed)
[facility] EMERGENCY DEPARTMENT Provider Note   CSN: [id number] Arrival date & time: 12/20/19  2012     History Chief Complaint  Patient presents with  . Chest Pain    [name] is a 54 y.o. female.  HPI     This is a 54 year old female who presents with 1 week history worsening chest and back pain.  Patient reports pain has gotten progressively worse.  It is worse in certain positions.  It is not exertional in nature.  She reports both upper respiratory symptoms and GI symptoms as well.  She has a productive cough and some nasal congestion.  No fevers or significant shortness of breath.  She also reports increased belching and some improvement with pain with having a bowel movement.  No significant abdominal pain or discomfort.  She describes discomfort as sharp and achy and nonradiating.  She rates her discomfort at 8 out of 10.  She has not taken anything for her pain.  Past Medical History:  Diagnosis Date  . Chest pain    negative Myoview march 2009  . HTN (hypertension)    controlled  . Lipoma    Hx-left leg. s/p excision  . Obesity   . OSA (obstructive sleep apnea)    mild  . Palpitation    event monito 2/10  . Rectal bleeding 5/10   normal colonoscopy. Dr. Arlyce Dice  . Yeast infection     Patient Active Problem List   Diagnosis Date Noted  . Chest pain 09/03/2016  . Viral URI 01/27/2014  . Hip pain 01/27/2014  . Vaginitis and vulvovaginitis 01/27/2014  . Other dysphagia 11/09/2013  . Thyroid nodule 11/09/2013  . Rhinitis 10/26/2013  . Nasal lesion 04/16/2013  . Routine general medical examination at a health care facility 08/25/2012  . DIABETES MELLITUS, TYPE II 07/19/2008  . HYPERLIPIDEMIA 07/19/2008  . ALLERGIC RHINITIS 07/19/2008  . GERD 07/19/2008  . PALPITATIONS 07/07/2008  . OBSTRUCTIVE SLEEP APNEA 06/14/2008  . OBESITY, MORBID 05/25/2008  . Essential hypertension 05/25/2008  . CHEST PAIN, ATYPICAL 05/25/2008    Past Surgical  History:  Procedure Laterality Date  . ABDOMINAL HYSTERECTOMY    . hysterectomy unspecified area       OB History   No obstetric history on file.     Family History  Problem Relation Age of Onset  . Allergies Sister        and daughter  . Allergy (severe) Sister   . Heart attack Sister   . Asthma Daughter   . Allergy (severe) Daughter   . Coronary artery disease Other        female 1st degree relative <60/ female 1st degree <50  . Stroke Other        female 1st degree relatvie <50  . Heart disease Mother   . Heart attack Mother   . Other Mother        rheumatism  . Heart disease Father   . Heart disease Paternal Grandfather   . Heart attack Paternal Grandfather   . Stroke Paternal Grandfather   . Cancer Paternal Grandfather        prostate  . Prostate cancer Maternal Grandfather   . Other Maternal Grandmother        rheumatism    Social History   Tobacco Use  . Smoking status: Current Some Day Smoker  . Smokeless tobacco: Never Used  Substance Use Topics  . Alcohol use: Yes    Comment: occasional  .  Drug use: No    Home Medications Prior to Admission medications   Medication Sig Start Date End Date Taking? Authorizing Provider  albuterol (PROVENTIL HFA;VENTOLIN HFA) 108 (90 Base) MCG/ACT inhaler Inhale 2 puffs into the lungs every 4 (four) hours as needed for wheezing or shortness of breath. 09/04/16   Adrian Saran, MD  aspirin EC 81 MG tablet Take 81 mg by mouth daily.    [provider]  EPINEPHrine 0.3 mg/0.3 mL IJ SOAJ injection Inject 0.3 mg into the skin daily as needed (allergies).  05/19/14   [provider]  fluticasone (FLONASE) 50 MCG/ACT nasal spray Place 2 sprays into both nostrils daily. 12/01/14   Ward, Layla Maw, DO  furosemide (LASIX) 40 MG tablet Take 1 tablet (40 mg total) by mouth daily for 15 days. 09/24/19 10/09/19  Dionne Bucy, MD  HYDROcodone-acetaminophen (NORCO/VICODIN) 5-325 MG tablet Take 1 tablet by mouth every 6  (six) hours as needed for moderate pain. 12/29/16   Tommi Rumps, PA-C  levofloxacin (LEVAQUIN) 750 MG tablet Take 1 tablet (750 mg total) by mouth daily. 09/04/16   Adrian Saran, MD  metFORMIN (GLUCOPHAGE-XR) 500 MG 24 hr tablet 2 tabs by mouth once daily. Patient taking differently: Take 1,000 mg by mouth daily.  11/25/12   Sandford Craze, NP  nebivolol (BYSTOLIC) 5 MG tablet Take 1 tablet (5 mg total) by mouth daily. 09/04/16   Adrian Saran, MD  omeprazole (PRILOSEC) 20 MG capsule Take 1 capsule (20 mg total) by mouth daily as needed (FOR ACID REFLUX). 02/04/19   Willy Eddy, MD  omeprazole (PRILOSEC) 20 MG capsule Take 1 capsule (20 mg total) by mouth daily. 12/21/19   Ronne Stefanski, Mayer Masker, MD  penicillin v potassium (VEETID) 500 MG tablet Take 1 tablet (500 mg total) by mouth 4 (four) times daily. 12/29/16   Tommi Rumps, PA-C  potassium chloride (MICRO-K) 10 MEQ CR capsule Take 1 capsule (10 mEq total) by mouth daily. 09/04/16   Adrian Saran, MD  sucralfate (CARAFATE) 1 g tablet Take 1 tablet (1 g total) by mouth 3 (three) times daily as needed. 02/04/19 02/04/20  Willy Eddy, MD    Allergies    Sulfonamide derivatives  Review of Systems   Review of Systems  Constitutional: Negative for fever.  Respiratory: Positive for cough. Negative for shortness of breath.   Cardiovascular: Positive for chest pain. Negative for leg swelling.  Gastrointestinal: Positive for constipation. Negative for abdominal pain, diarrhea, nausea and vomiting.  Genitourinary: Negative for dysuria.  All other systems reviewed and are negative.   Physical Exam Updated Vital Signs BP (!) 157/87 (BP Location: Right Arm)   Pulse 69   Temp 98.7 F (37.1 C) (Oral)   Resp 20   Ht 1.651 m (5\' 5" )   Wt (!) 140.2 kg   SpO2 100%   BMI 51.42 kg/m   Physical Exam Vitals and nursing note reviewed.  Constitutional:      Appearance: She is well-developed. She is obese. She is not ill-appearing.    HENT:     Head: Normocephalic and atraumatic.  Eyes:     Pupils: Pupils are equal, round, and reactive to light.  Cardiovascular:     Rate and Rhythm: Normal rate and regular rhythm.     Heart sounds: Normal heart sounds.  Pulmonary:     Effort: Pulmonary effort is normal. No respiratory distress.     Breath sounds: No wheezing.  Abdominal:     General: Bowel sounds are  normal.     Palpations: Abdomen is soft.  Musculoskeletal:     Cervical back: Neck supple.     Right lower leg: No tenderness. No edema.     Left lower leg: No tenderness. No edema.  Skin:    General: Skin is warm and dry.  Neurological:     Mental Status: She is alert and oriented to person, place, and time.  Psychiatric:        Mood and Affect: Mood normal.     ED Results / Procedures / Treatments   Labs (all labs ordered are listed, but only abnormal results are displayed) Labs Reviewed  BASIC METABOLIC PANEL - Abnormal; Notable for the following components:      Result Value   Glucose, Bld 115 (*)    All other components within normal limits  CBC  PREGNANCY, URINE  D-DIMER, QUANTITATIVE (NOT AT [facility])  HEPATIC FUNCTION PANEL  LIPASE, BLOOD  TROPONIN I (HIGH SENSITIVITY)  TROPONIN I (HIGH SENSITIVITY)    EKG EKG Interpretation  Date/Time:  Sunday December 20 2019 20:23:26 EDT Ventricular Rate:  69 PR Interval:  158 QRS Duration: 82 QT Interval:  388 QTC Calculation: 415 R Axis:   16 Text Interpretation: Normal sinus rhythm Possible Left atrial enlargement Cannot rule out Anterior infarct , age undetermined Abnormal ECG Confirmed by Ross MarcusHorton, Alauna Hayden ([id number]) on 12/21/2019 12:17:06 AM   Radiology DG Chest 2 View  Result Date: 12/20/2019 CLINICAL DATA:  Chest pain EXAM: CHEST - 2 VIEW COMPARISON:  09/24/2019 FINDINGS: The heart size and mediastinal contours are within normal limits. Both lungs are clear. The visualized skeletal structures are unremarkable. IMPRESSION: No active  cardiopulmonary disease. Electronically Signed   By: Jasmine PangKim  Fujinaga M.D.   On: 12/20/2019 20:55    Procedures Procedures (including critical care time)  Medications Ordered in ED Medications  pantoprazole (PROTONIX) injection 40 mg (40 mg Intravenous Given 12/21/19 0045)  alum & mag hydroxide-simeth (MAALOX/MYLANTA) 200-200-20 MG/5ML suspension 30 mL (30 mLs Oral Given 12/21/19 0046)    ED Course  I have reviewed the triage vital signs and the nursing notes.  Pertinent labs & imaging results that were available during my care of the patient were reviewed by me and considered in my medical decision making (see chart for details).    MDM Rules/Calculators/A&P                          Patient presents with atypical chest and back pain.  She is overall nontoxic and vital signs are reassuring.  She is obese.  There are some symptoms that suggest upper respiratory etiology; however, there are also symptoms that suggest reflux.  Symptoms are highly atypical for ACS.  EKG shows no acute ischemic changes and troponin x2 are negative.  Chest x-ray shows no evidence of pneumothorax or pneumonia.  LFTs and lipase are reassuring which makes cholecystitis and pancreatitis less likely.  Patient reports significant improvement after Protonix and a GI cocktail.  Feel this is highly suggestive of reflux.  She has previously been on omeprazole.  Will restart.  We will have her follow-up with her primary physician.  After history, exam, and medical workup I feel the patient has been appropriately medically screened and is safe for discharge home. Pertinent diagnoses were discussed with the patient. Patient was given return precautions.    Final Clinical Impression(s) / ED Diagnoses Final diagnoses:  Atypical chest pain    Rx / DC Orders  ED Discharge Orders         Ordered    omeprazole (PRILOSEC) 20 MG capsule  Daily        12/21/19 0330           Shon Baton, MD 12/21/19 [phone]

## 2019-12-21 NOTE — ED Notes (Signed)
Reports taking vitamin C "emergen-c" that improved her pain as well.

## 2019-12-21 NOTE — ED Notes (Signed)
Woke patient from sleep for discharge instructions. Requesting pain medication. EDP made aware.

## 2019-12-21 NOTE — Discharge Instructions (Addendum)
You were seen today for chest pain and back pain.  Your work-up is largely reassuring.  Restart omeprazole for symptom management as this may be related to reflux.  Follow-up closely with your primary physician if not improving.

## 2020-01-01 ENCOUNTER — Emergency Department (HOSPITAL_BASED_OUTPATIENT_CLINIC_OR_DEPARTMENT_OTHER): Payer: BC Managed Care – PPO

## 2020-01-01 ENCOUNTER — Other Ambulatory Visit: Payer: Self-pay

## 2020-01-01 ENCOUNTER — Encounter (HOSPITAL_BASED_OUTPATIENT_CLINIC_OR_DEPARTMENT_OTHER): Payer: Self-pay | Admitting: *Deleted

## 2020-01-01 ENCOUNTER — Emergency Department (HOSPITAL_BASED_OUTPATIENT_CLINIC_OR_DEPARTMENT_OTHER)
Admission: EM | Admit: 2020-01-01 | Discharge: 2020-01-01 | Disposition: A | Discharge status: Home / Self Care | Payer: BC Managed Care – PPO | Attending: Emergency Medicine | Admitting: Emergency Medicine

## 2020-01-01 ENCOUNTER — Telehealth (HOSPITAL_BASED_OUTPATIENT_CLINIC_OR_DEPARTMENT_OTHER): Payer: Self-pay | Admitting: Emergency Medicine

## 2020-01-01 DIAGNOSIS — Z20822 Contact with and (suspected) exposure to covid-19: Secondary | ICD-10-CM | POA: Diagnosis not present

## 2020-01-01 DIAGNOSIS — Z79899 Other long term (current) drug therapy: Secondary | ICD-10-CM | POA: Insufficient documentation

## 2020-01-01 DIAGNOSIS — J189 Pneumonia, unspecified organism: Secondary | ICD-10-CM

## 2020-01-01 DIAGNOSIS — Z7984 Long term (current) use of oral hypoglycemic drugs: Secondary | ICD-10-CM | POA: Diagnosis not present

## 2020-01-01 DIAGNOSIS — I10 Essential (primary) hypertension: Secondary | ICD-10-CM | POA: Diagnosis not present

## 2020-01-01 DIAGNOSIS — F17200 Nicotine dependence, unspecified, uncomplicated: Secondary | ICD-10-CM | POA: Insufficient documentation

## 2020-01-01 DIAGNOSIS — J181 Lobar pneumonia, unspecified organism: Secondary | ICD-10-CM | POA: Insufficient documentation

## 2020-01-01 DIAGNOSIS — M79604 Pain in right leg: Secondary | ICD-10-CM

## 2020-01-01 DIAGNOSIS — E119 Type 2 diabetes mellitus without complications: Secondary | ICD-10-CM | POA: Insufficient documentation

## 2020-01-01 DIAGNOSIS — R2242 Localized swelling, mass and lump, left lower limb: Secondary | ICD-10-CM | POA: Diagnosis not present

## 2020-01-01 DIAGNOSIS — M7989 Other specified soft tissue disorders: Secondary | ICD-10-CM

## 2020-01-01 LAB — CBC WITH DIFFERENTIAL/PLATELET
Abs Immature Granulocytes: 0.02 10*3/uL (ref 0.00–0.07)
Basophils Absolute: 0 10*3/uL (ref 0.0–0.1)
Basophils Relative: 0 %
Eosinophils Absolute: 0.1 10*3/uL (ref 0.0–0.5)
Eosinophils Relative: 1 %
HCT: 39.7 % (ref 36.0–46.0)
Hemoglobin: 12.4 g/dL (ref 12.0–15.0)
Immature Granulocytes: 0 %
Lymphocytes Relative: 28 %
Lymphs Abs: 2.5 10*3/uL (ref 0.7–4.0)
MCH: 26.2 pg (ref 26.0–34.0)
MCHC: 31.2 g/dL (ref 30.0–36.0)
MCV: 83.8 fL (ref 80.0–100.0)
Monocytes Absolute: 0.4 10*3/uL (ref 0.1–1.0)
Monocytes Relative: 5 %
Neutro Abs: 5.8 10*3/uL (ref 1.7–7.7)
Neutrophils Relative %: 66 %
Platelets: 291 10*3/uL (ref 150–400)
RBC: 4.74 MIL/uL (ref 3.87–5.11)
RDW: 14.1 % (ref 11.5–15.5)
WBC: 8.8 10*3/uL (ref 4.0–10.5)
nRBC: 0 % (ref 0.0–0.2)

## 2020-01-01 LAB — RESPIRATORY PANEL BY RT PCR (FLU A&B, COVID)
Influenza A by PCR: NEGATIVE
Influenza B by PCR: NEGATIVE
SARS Coronavirus 2 by RT PCR: NEGATIVE

## 2020-01-01 LAB — BASIC METABOLIC PANEL
Anion gap: 11 (ref 5–15)
BUN: 14 mg/dL (ref 6–20)
CO2: 24 mmol/L (ref 22–32)
Calcium: 9.2 mg/dL (ref 8.9–10.3)
Chloride: 103 mmol/L (ref 98–111)
Creatinine, Ser: 0.89 mg/dL (ref 0.44–1.00)
GFR calc Af Amer: >60 mL/min (ref >60)
GFR calc non Af Amer: >60 mL/min (ref >60)
Glucose, Bld: 161 mg/dL — ABNORMAL HIGH (ref 70–99)
Potassium: 3.6 mmol/L (ref 3.5–5.1)
Sodium: 138 mmol/L (ref 135–145)

## 2020-01-01 LAB — TROPONIN I (HIGH SENSITIVITY): Troponin I (High Sensitivity): 4 ng/L (ref <18)

## 2020-01-01 MED ORDER — PANTOPRAZOLE SODIUM 40 MG PO TBEC
40.0000 mg | DELAYED_RELEASE_TABLET | Freq: Once | ORAL | Status: AC
  Administered 2020-01-01: 40 mg via ORAL
  Filled 2020-01-01: qty 1

## 2020-01-01 MED ORDER — ALUM & MAG HYDROXIDE-SIMETH 200-200-20 MG/5ML PO SUSP
30.0000 mL | Freq: Once | ORAL | Status: AC
  Administered 2020-01-01: 30 mL via ORAL
  Filled 2020-01-01: qty 30

## 2020-01-01 MED ORDER — ALBUTEROL SULFATE HFA 108 (90 BASE) MCG/ACT IN AERS
2.0000 | INHALATION_SPRAY | Freq: Once | RESPIRATORY_TRACT | Status: AC
  Administered 2020-01-01: 2 via RESPIRATORY_TRACT
  Filled 2020-01-01: qty 6.7

## 2020-01-01 MED ORDER — ALBUTEROL SULFATE (2.5 MG/3ML) 0.083% IN NEBU: 2.5000 mg | INHALATION_SOLUTION | Freq: Four times a day (QID) | RESPIRATORY_TRACT | 12 refills | Status: AC | PRN

## 2020-01-01 MED ORDER — DOXYCYCLINE HYCLATE 100 MG PO CAPS: 100.0000 mg | ORAL_CAPSULE | Freq: Two times a day (BID) | ORAL | 0 refills | Status: AC

## 2020-01-01 MED ORDER — BENZONATATE 100 MG PO CAPS: 100.0000 mg | ORAL_CAPSULE | Freq: Three times a day (TID) | ORAL | 0 refills | Status: AC

## 2020-01-01 NOTE — Discharge Instructions (Signed)
You were given a prescription for antibiotics. Please take the antibiotic prescription fully.   Take tessalon as directed.   If you are still experiencing leg pain and swelling you will need to have the ultrasound repeated in 7 days.   Please follow up with your primary care provider within 3 days for re-evaluation of your symptoms. If you do not have a primary care provider, information for a healthcare clinic has been provided for you to make arrangements for follow up care. Please return to the emergency department for any new or worsening symptoms.

## 2020-01-01 NOTE — ED Notes (Signed)
US at bedside

## 2020-01-01 NOTE — ED Provider Notes (Signed)
[facility] EMERGENCY DEPARTMENT Provider Note   CSN: [id number] Arrival date & time: 01/01/20  1659     History Chief Complaint  Patient presents with   Leg Swelling    [name] is a 54 y.o. female.  HPI   54 year old female with a history of chest pain, hypertension, lipoma, OSA, palpitations, who presents the emergency department today to be evaluated for leg swelling.  Patient was seen at urgent care prior to arrival for evaluation of left leg swelling.  They sent her here for rule out DVT.  On my evaluation patient is now complaining of swelling and pain to both of her legs which has been ongoing for a day.  She has history of lipoma excision and in intermittent swelling of the left lower extremity at baseline however felt it was worse today and had some new pain.  She states she takes Lasix for this chronically.  She further reports that she has had a cough, intermittent shortness of breath, chest pain and burping for the last several weeks.  She was seen in the emergency department about 2 weeks ago for similar symptoms and had a reassuring work-up including negative troponins and negative D-dimer.  She was discharged with medications for acid reflux.  She states that her chest pain seems to improved after taking the medication and she symptoms feels like the chest pain is worse when she presses on her chest.  She denies any fevers.  She has been vaccinated against Covid.  Past Medical History:  Diagnosis Date   Chest pain    negative Myoview march 2009   HTN (hypertension)    controlled   Lipoma    Hx-left leg. s/p excision   Obesity    OSA (obstructive sleep apnea)    mild   Palpitation    event monito 2/10   Rectal bleeding 5/10   normal colonoscopy. Dr. Erlene Quan infection     Patient Active Problem List   Diagnosis Date Noted   Chest pain 09/03/2016   Viral URI 01/27/2014   Hip pain 01/27/2014   Vaginitis and vulvovaginitis  01/27/2014   Other dysphagia 11/09/2013   Thyroid nodule 11/09/2013   Rhinitis 10/26/2013   Nasal lesion 04/16/2013   Routine general medical examination at a health care facility 08/25/2012   DIABETES MELLITUS, TYPE II 07/19/2008   HYPERLIPIDEMIA 07/19/2008   ALLERGIC RHINITIS 07/19/2008   GERD 07/19/2008   PALPITATIONS 07/07/2008   OBSTRUCTIVE SLEEP APNEA 06/14/2008   OBESITY, MORBID 05/25/2008   Essential hypertension 05/25/2008   CHEST PAIN, ATYPICAL 05/25/2008    Past Surgical History:  Procedure Laterality Date   ABDOMINAL HYSTERECTOMY     hysterectomy unspecified area       OB History   No obstetric history on file.     Family History  Problem Relation Age of Onset   Allergies Sister        and daughter   Allergy (severe) Sister    Heart attack Sister    Asthma Daughter    Allergy (severe) Daughter    Coronary artery disease Other        female 1st degree relative <60/ female 1st degree <50   Stroke Other        female 1st degree relatvie <50   Heart disease Mother    Heart attack Mother    Other Mother        rheumatism   Heart disease Father    Heart  disease Paternal Grandfather    Heart attack Paternal Grandfather    Stroke Paternal Grandfather    Cancer Paternal Grandfather        prostate   Prostate cancer Maternal Grandfather    Other Maternal Grandmother        rheumatism    Social History   Tobacco Use   Smoking status: Current Some Day Smoker   Smokeless tobacco: Never Used  Substance Use Topics   Alcohol use: Yes    Comment: occasional   Drug use: No    Home Medications Prior to Admission medications   Medication Sig Start Date End Date Taking? Authorizing Provider  albuterol (PROVENTIL) (2.5 MG/3ML) 0.083% nebulizer solution Take 3 mLs (2.5 mg total) by nebulization every 6 (six) hours as needed for wheezing or shortness of breath. 01/01/20   Jaydin Jalomo S, PA-C  aspirin EC 81 MG tablet Take 81  mg by mouth daily.    [provider]  benzonatate (TESSALON) 100 MG capsule Take 1 capsule (100 mg total) by mouth every 8 (eight) hours for 5 days. 01/01/20 01/06/20  Isley Zinni S, PA-C  doxycycline (VIBRAMYCIN) 100 MG capsule Take 1 capsule (100 mg total) by mouth 2 (two) times daily for 7 days. 01/01/20 01/08/20  Linnaea Ahn S, PA-C  EPINEPHrine 0.3 mg/0.3 mL IJ SOAJ injection Inject 0.3 mg into the skin daily as needed (allergies).  05/19/14   [provider]  fluticasone (FLONASE) 50 MCG/ACT nasal spray Place 2 sprays into both nostrils daily. 12/01/14   Ward, Layla Maw, DO  furosemide (LASIX) 40 MG tablet Take 1 tablet (40 mg total) by mouth daily for 15 days. 09/24/19 10/09/19  Dionne Bucy, MD  HYDROcodone-acetaminophen (NORCO/VICODIN) 5-325 MG tablet Take 1 tablet by mouth every 6 (six) hours as needed for moderate pain. 12/29/16   Tommi Rumps, PA-C  levofloxacin (LEVAQUIN) 750 MG tablet Take 1 tablet (750 mg total) by mouth daily. 09/04/16   Adrian Saran, MD  metFORMIN (GLUCOPHAGE-XR) 500 MG 24 hr tablet 2 tabs by mouth once daily. Patient taking differently: Take 1,000 mg by mouth daily.  11/25/12   Sandford Craze, NP  nebivolol (BYSTOLIC) 5 MG tablet Take 1 tablet (5 mg total) by mouth daily. 09/04/16   Adrian Saran, MD  omeprazole (PRILOSEC) 20 MG capsule Take 1 capsule (20 mg total) by mouth daily as needed (FOR ACID REFLUX). 02/04/19   Willy Eddy, MD  omeprazole (PRILOSEC) 20 MG capsule Take 1 capsule (20 mg total) by mouth daily. 12/21/19   Horton, Mayer Masker, MD  penicillin v potassium (VEETID) 500 MG tablet Take 1 tablet (500 mg total) by mouth 4 (four) times daily. 12/29/16   Tommi Rumps, PA-C  potassium chloride (MICRO-K) 10 MEQ CR capsule Take 1 capsule (10 mEq total) by mouth daily. 09/04/16   Adrian Saran, MD  sucralfate (CARAFATE) 1 g tablet Take 1 tablet (1 g total) by mouth 3 (three) times daily as needed. 02/04/19 02/04/20  Willy Eddy, MD    Allergies    Sulfonamide derivatives  Review of Systems   Review of Systems  Constitutional: Negative for chills and fever.  HENT: Negative for ear pain and sore throat.   Eyes: Negative for pain and visual disturbance.  Respiratory: Positive for cough and shortness of breath.   Cardiovascular: Positive for chest pain and leg swelling. Negative for palpitations.  Gastrointestinal: Negative for abdominal pain, constipation, diarrhea, nausea and vomiting.  Genitourinary: Negative for dysuria and hematuria.  Musculoskeletal: Negative for back pain.  Skin: Negative for rash.  Neurological: Negative for seizures and syncope.  All other systems reviewed and are negative.   Physical Exam Updated Vital Signs BP (!) 183/88 (BP Location: Right Arm)    Pulse 83    Temp 98.3 F (36.8 C) (Oral)    Resp 18    Ht 5\' 5"  (1.651 m)    Wt (!) 233.6 kg    SpO2 100%    BMI 85.70 kg/m   Physical Exam Vitals and nursing note reviewed.  Constitutional:      General: She is not in acute distress.    Appearance: She is well-developed.  HENT:     Head: Normocephalic and atraumatic.  Eyes:     Conjunctiva/sclera: Conjunctivae normal.  Cardiovascular:     Rate and Rhythm: Normal rate and regular rhythm.     Heart sounds: Normal heart sounds. No murmur heard.   Pulmonary:     Effort: Pulmonary effort is normal. No respiratory distress.     Breath sounds: Normal breath sounds. No wheezing, rhonchi or rales.     Comments: No tachypnea, speaking in full sentences Abdominal:     Palpations: Abdomen is soft.     Tenderness: There is no abdominal tenderness.  Musculoskeletal:        General: Tenderness present.     Cervical back: Neck supple.     Comments: 1+ pitting edema to the bilateral lower extremities with large swollen deformity that is chronic to the left lower extremity.  There is some mild calf tenderness of the left lower extremity with no notable calf tenderness to the right  lower extremity.    Skin:    General: Skin is warm and dry.  Neurological:     Mental Status: She is alert.     ED Results / Procedures / Treatments   Labs (all labs ordered are listed, but only abnormal results are displayed) Labs Reviewed  BASIC METABOLIC PANEL - Abnormal; Notable for the following components:      Result Value   Glucose, Bld 161 (*)    All other components within normal limits  RESPIRATORY PANEL BY RT PCR (FLU A&B, COVID)  CBC WITH DIFFERENTIAL/PLATELET  TROPONIN I (HIGH SENSITIVITY)  TROPONIN I (HIGH SENSITIVITY)    EKG None  Radiology US Venous Img Lower Bilateral (DVT)  Result Date: 01/01/2020 CLINICAL DATA:  Leg pain EXAM: BILATERAL LOWER EXTREMITY VENOUS DOPPLER ULTRASOUND TECHNIQUE: Gray-scale sonography with compression, as well as color and duplex ultrasound, were performed to evaluate the deep venous system(s) from the level of the common femoral vein through the popliteal and proximal calf veins. COMPARISON:  None. FINDINGS: VENOUS Normal compressibility of the common femoral, superficial femoral, and popliteal veins. The calf veins were suboptimally evaluated. Visualized portions of profunda femoral vein and great saphenous vein unremarkable. No filling defects to suggest DVT on grayscale or color Doppler imaging. Doppler waveforms show normal direction of venous flow, normal respiratory plasticity and response to augmentation. OTHER None. Limitations: Patient body habitus IMPRESSION: Limited study as detailed above. No definite DVT identified on this study, however the calf veins were not well visualized. Electronically Signed   By: Katherine Mantlehristopher  Green M.D.   On: 01/01/2020 19:10   DG Chest Portable 1 View  Result Date: 01/01/2020 CLINICAL DATA:  Cough, shortness of breath EXAM: PORTABLE CHEST 1 VIEW COMPARISON:  12/20/2019 chest radiograph and prior. FINDINGS: The heart size and mediastinal contours are within normal limits. Medial right  basilar  atelectasis. No pneumothorax or pleural effusion. No acute osseous abnormality. IMPRESSION: Medial right basilar opacities. Electronically Signed   By: Stana Bunting M.D.   On: 01/01/2020 18:38    Procedures Procedures (including critical care time)  Medications Ordered in ED Medications  alum & mag hydroxide-simeth (MAALOX/MYLANTA) 200-200-20 MG/5ML suspension 30 mL (30 mLs Oral Given 01/01/20 1907)  pantoprazole (PROTONIX) EC tablet 40 mg (40 mg Oral Given 01/01/20 1954)  albuterol (VENTOLIN HFA) 108 (90 Base) MCG/ACT inhaler 2 puff (2 puffs Inhalation Given 01/01/20 1941)    ED Course  I have reviewed the triage vital signs and the nursing notes.  Pertinent labs & imaging results that were available during my care of the patient were reviewed by me and considered in my medical decision making (see chart for details).    MDM Rules/Calculators/A&P                          54 year old female presenting for evaluation of leg swelling.  Seen by prior provider urgent care complaining of only left leg swelling however on my evaluation she is complaining of bilateral leg swelling and pain.  She is chronically on Lasix for lower extremity swelling.  She also reports some upper respiratory symptoms for the last week.  Reviewed/interpreted labs CBC without leukocytosis or anemia BMP with no significant electrolyte derangement.  Normal creatinine Troponin is negative Covid testing is negative  EKG showed normal sinus rhythm with probable left atrial enlargement  Chest x-ray reviewed/interpreted which showed medial right basilar opacities.  No evidence of pulmonary edema or other findings to suggest CHF exacerbation  Ultrasound not show any definitive DVT  -I did advise patient that if she continues to have symptoms she will need to have the ultrasound repeated in 7 days   Patient was given a GI cocktail and had some improvement of symptoms while in the emergency department.  Suspect  that her symptoms are likely related to pneumonia found on chest x-ray.  I have low suspicion for other cause of symptoms such as ACS given negative troponin normal EKG.  Also have low suspicion for PE given patient has had symptoms ongoing and she just recently had a negative D-dimer while having the same symptoms.  I will discharge her with cough medication, antibiotics.  I also refilled her inhaler and her nebulizer medication for home.  Advise close follow-up with PCP, she has an appointment in 3 days.  Advised on return precautions.  She voices understanding of plan reasons to return. all Questions answered.  Patient able for discharge.  Final Clinical Impression(s) / ED Diagnoses Final diagnoses:  Community acquired pneumonia of right lung, unspecified part of lung  Leg swelling    Rx / DC Orders ED Discharge Orders         Ordered    doxycycline (VIBRAMYCIN) 100 MG capsule  2 times daily        01/01/20 1929    benzonatate (TESSALON) 100 MG capsule  Every 8 hours        01/01/20 1929    albuterol (PROVENTIL) (2.5 MG/3ML) 0.083% nebulizer solution  Every 6 hours PRN        01/01/20 1929           Rayne Du 01/01/20 2002    Pollyann Savoy, MD 01/01/20 2112

## 2020-01-01 NOTE — ED Triage Notes (Addendum)
Sent here by PMD for [place] r/o DVT , c/o left  lower leg swelling x 1 day

## 2020-01-01 NOTE — ED Notes (Signed)
Waiting to do U/S bedside once patient is moved to ED room (out of fast track) as patient's weight exceeds U/S bed limit

## 2020-01-01 NOTE — ED Notes (Signed)
XR at bedside

## 2020-01-01 NOTE — Telephone Encounter (Signed)
Received call from patient; patient requesting [facility] [place]; [phone].  [facility] [place]; [phone]; prescriptions called into this pharmacy; they [facility] [place] and [facility] them as prescribed. Patient currently at pharmacy in [facility] awaiting prescriptions. Patient aware that this nurse is calling pharmacy to transfer prescriptions.

## 2020-10-09 ENCOUNTER — Emergency Department
Admission: EM | Admit: 2020-10-09 | Discharge: 2020-10-09 | Disposition: A | Discharge status: Home / Self Care | Payer: BC Managed Care – PPO | Attending: Emergency Medicine | Admitting: Emergency Medicine

## 2020-10-09 ENCOUNTER — Emergency Department: Payer: BC Managed Care – PPO

## 2020-10-09 ENCOUNTER — Other Ambulatory Visit: Payer: Self-pay

## 2020-10-09 DIAGNOSIS — Z79899 Other long term (current) drug therapy: Secondary | ICD-10-CM | POA: Diagnosis not present

## 2020-10-09 DIAGNOSIS — Z7982 Long term (current) use of aspirin: Secondary | ICD-10-CM | POA: Diagnosis not present

## 2020-10-09 DIAGNOSIS — I10 Essential (primary) hypertension: Secondary | ICD-10-CM | POA: Diagnosis not present

## 2020-10-09 DIAGNOSIS — L0390 Cellulitis, unspecified: Secondary | ICD-10-CM

## 2020-10-09 DIAGNOSIS — X58XXXA Exposure to other specified factors, initial encounter: Secondary | ICD-10-CM | POA: Diagnosis not present

## 2020-10-09 DIAGNOSIS — T63461A Toxic effect of venom of wasps, accidental (unintentional), initial encounter: Secondary | ICD-10-CM | POA: Diagnosis present

## 2020-10-09 DIAGNOSIS — Z7984 Long term (current) use of oral hypoglycemic drugs: Secondary | ICD-10-CM | POA: Diagnosis not present

## 2020-10-09 DIAGNOSIS — F17200 Nicotine dependence, unspecified, uncomplicated: Secondary | ICD-10-CM | POA: Diagnosis not present

## 2020-10-09 DIAGNOSIS — E119 Type 2 diabetes mellitus without complications: Secondary | ICD-10-CM | POA: Diagnosis not present

## 2020-10-09 DIAGNOSIS — R059 Cough, unspecified: Secondary | ICD-10-CM | POA: Diagnosis not present

## 2020-10-09 DIAGNOSIS — L03115 Cellulitis of right lower limb: Secondary | ICD-10-CM | POA: Diagnosis not present

## 2020-10-09 MED ORDER — DEXAMETHASONE SODIUM PHOSPHATE 10 MG/ML IJ SOLN
10.0000 mg | Freq: Once | INTRAMUSCULAR | Status: AC
  Administered 2020-10-09: 10 mg via INTRAMUSCULAR
  Filled 2020-10-09: qty 1

## 2020-10-09 MED ORDER — CEPHALEXIN 500 MG PO CAPS: 500.0000 mg | ORAL_CAPSULE | Freq: Three times a day (TID) | ORAL | 0 refills | Status: DC

## 2020-10-09 MED ORDER — PREDNISONE 10 MG (21) PO TBPK: ORAL_TABLET | ORAL | 0 refills | Status: AC

## 2020-10-09 NOTE — ED Provider Notes (Signed)
[facility] Emergency Department Provider Note  ____________________________________________   Event Date/Time   First MD Initiated Contact with Patient 10/09/20 1614     (approximate)  I have reviewed the triage vital signs and the nursing notes.   HISTORY  Chief Complaint Allergic Reaction    HPI [name] is a 55 y.o. female presents emergency department complaining of a wasp sting.  Yesterday she gave herself an epi injection and took some Benadryl.  States she did have a little trouble breathing yesterday but none today, states the area is red and painful.  Does have a cough and is worried she has pneumonia  Past Medical History:  Diagnosis Date   Chest pain    negative Myoview march 2009   HTN (hypertension)    controlled   Lipoma    Hx-left leg. s/p excision   Obesity    OSA (obstructive sleep apnea)    mild   Palpitation    event monito 2/10   Rectal bleeding 5/10   normal colonoscopy. Dr. Erlene Quan infection     Patient Active Problem List   Diagnosis Date Noted   Chest pain 09/03/2016   Viral URI 01/27/2014   Hip pain 01/27/2014   Vaginitis and vulvovaginitis 01/27/2014   Other dysphagia 11/09/2013   Thyroid nodule 11/09/2013   Rhinitis 10/26/2013   Nasal lesion 04/16/2013   Routine general medical examination at a health care facility 08/25/2012   DIABETES MELLITUS, TYPE II 07/19/2008   HYPERLIPIDEMIA 07/19/2008   ALLERGIC RHINITIS 07/19/2008   GERD 07/19/2008   PALPITATIONS 07/07/2008   OBSTRUCTIVE SLEEP APNEA 06/14/2008   OBESITY, MORBID 05/25/2008   Essential hypertension 05/25/2008   CHEST PAIN, ATYPICAL 05/25/2008    Past Surgical History:  Procedure Laterality Date   ABDOMINAL HYSTERECTOMY     hysterectomy unspecified area      Prior to Admission medications   Medication Sig Start Date End Date Taking? Authorizing Provider  cephALEXin (KEFLEX) 500 MG capsule Take 1 capsule (500 mg total) by mouth  3 (three) times daily. 10/09/20  Yes Triston Skare, Roselyn Bering, PA-C  predniSONE (STERAPRED UNI-PAK 21 TAB) 10 MG (21) TBPK tablet Take 6 pills on day one then decrease by 1 pill each day 10/09/20  Yes Shadae Reino, Roselyn Bering, PA-C  albuterol (PROVENTIL) (2.5 MG/3ML) 0.083% nebulizer solution Take 3 mLs (2.5 mg total) by nebulization every 6 (six) hours as needed for wheezing or shortness of breath. 01/01/20   Couture, Cortni S, PA-C  aspirin EC 81 MG tablet Take 81 mg by mouth daily.    [provider]  EPINEPHrine 0.3 mg/0.3 mL IJ SOAJ injection Inject 0.3 mg into the skin daily as needed (allergies).  05/19/14   [provider]  fluticasone (FLONASE) 50 MCG/ACT nasal spray Place 2 sprays into both nostrils daily. 12/01/14   Ward, Layla Maw, DO  furosemide (LASIX) 40 MG tablet Take 1 tablet (40 mg total) by mouth daily for 15 days. 09/24/19 10/09/19  Dionne Bucy, MD  HYDROcodone-acetaminophen (NORCO/VICODIN) 5-325 MG tablet Take 1 tablet by mouth every 6 (six) hours as needed for moderate pain. 12/29/16   Tommi Rumps, PA-C  levofloxacin (LEVAQUIN) 750 MG tablet Take 1 tablet (750 mg total) by mouth daily. 09/04/16   Adrian Saran, MD  metFORMIN (GLUCOPHAGE-XR) 500 MG 24 hr tablet 2 tabs by mouth once daily. Patient taking differently: Take 1,000 mg by mouth daily.  11/25/12   Sandford Craze, NP  nebivolol (BYSTOLIC)  5 MG tablet Take 1 tablet (5 mg total) by mouth daily. 09/04/16   Adrian Saran, MD  omeprazole (PRILOSEC) 20 MG capsule Take 1 capsule (20 mg total) by mouth daily as needed (FOR ACID REFLUX). 02/04/19   Willy Eddy, MD  omeprazole (PRILOSEC) 20 MG capsule Take 1 capsule (20 mg total) by mouth daily. 12/21/19   Horton, Mayer Masker, MD  penicillin v potassium (VEETID) 500 MG tablet Take 1 tablet (500 mg total) by mouth 4 (four) times daily. 12/29/16   Tommi Rumps, PA-C  potassium chloride (MICRO-K) 10 MEQ CR capsule Take 1 capsule (10 mEq total) by mouth daily. 09/04/16   Adrian Saran, MD  sucralfate (CARAFATE) 1 g tablet Take 1 tablet (1 g total) by mouth 3 (three) times daily as needed. 02/04/19 02/04/20  Willy Eddy, MD    Allergies Sulfonamide derivatives  Family History  Problem Relation Age of Onset   Allergies Sister        and daughter   Allergy (severe) Sister    Heart attack Sister    Asthma Daughter    Allergy (severe) Daughter    Coronary artery disease Other        female 1st degree relative <60/ female 1st degree <50   Stroke Other        female 1st degree relatvie <50   Heart disease Mother    Heart attack Mother    Other Mother        rheumatism   Heart disease Father    Heart disease Paternal Grandfather    Heart attack Paternal Grandfather    Stroke Paternal Grandfather    Cancer Paternal Grandfather        prostate   Prostate cancer Maternal Grandfather    Other Maternal Grandmother        rheumatism    Social History Social History   Tobacco Use   Smoking status: Some Days    Pack years: 0.00   Smokeless tobacco: Never  Substance Use Topics   Alcohol use: Yes    Comment: occasional   Drug use: No    Review of Systems  Constitutional: No fever/chills Eyes: No visual changes. ENT: No sore throat. Respiratory: Positive cough Cardiovascular: Denies chest pain Gastrointestinal: Denies abdominal pain Genitourinary: Negative for dysuria. Musculoskeletal: Negative for back pain. Skin: Negative for rash.  Positive for insect sting Psychiatric: no mood changes,     ____________________________________________   PHYSICAL EXAM:  VITAL SIGNS: ED Triage Vitals  Enc Vitals Group     BP 10/09/20 1553 121/70     Pulse Rate 10/09/20 1553 80     Resp 10/09/20 1553 20     Temp 10/09/20 1553 98.7 F (37.1 C)     Temp Source 10/09/20 1553 Oral     SpO2 10/09/20 1553 97 %     Weight 10/09/20 1553 (!) 309 lb (140.2 kg)     Height 10/09/20 1553 5\' 5"  (1.651 m)     Head Circumference --      Peak Flow --      Pain  Score 10/09/20 1557 10     Pain Loc --      Pain Edu? --      Excl. in GC? --     Constitutional: Alert and oriented. Well appearing and in no acute distress. Eyes: Conjunctivae are normal.  Head: Atraumatic. Nose: No congestion/rhinnorhea. Mouth/Throat: Mucous membranes are moist.  No oral edema noted Neck:  supple no lymphadenopathy noted  Cardiovascular: Normal rate, regular rhythm. Heart sounds are normal Respiratory: Normal respiratory effort.  No retractions, lungs c t a  GU: deferred Musculoskeletal: FROM all extremities, warm and well perfused Neurologic:  Normal speech and language.  Skin:  Skin is warm, dry and intact. No rash noted.  2 large red circles noted on the back of the right shoulder consistent with a wasp sting Psychiatric: Mood and affect are normal. Speech and behavior are normal.  ____________________________________________   LABS (all labs ordered are listed, but only abnormal results are displayed)  Labs Reviewed - No data to display ____________________________________________   ____________________________________________  RADIOLOGY  Chest x-ray  ____________________________________________   PROCEDURES  Procedure(s) performed: Decadron 10 mg IM   Procedures    ____________________________________________   INITIAL IMPRESSION / ASSESSMENT AND PLAN / ED COURSE  Pertinent labs & imaging results that were available during my care of the patient were reviewed by me and considered in my medical decision making (see chart for details).   Patient is a 55 year old female presents emergency department after a insect sting.  See HPI.  Physical exam shows patient per stable.  Chest x-ray reviewed by me and confirmed by radiology to be negative.  Patient was given Decadron 10 mg IM.  She will be given a prescription for Keflex for the cellulitis, and Sterapred for the allergic reaction.  She is apply ice to the area.  Take over-the-counter  Benadryl.  Return emergency department worsening.  Did explain to her that if she uses her EpiPen she should always come to the emergency department immediately.  She states she did not understand that originally and we will do that next time.  She was discharged stable condition.     [name] was evaluated in Emergency Department on 10/09/2020 for the symptoms described in the history of present illness. She was evaluated in the context of the global COVID-19 pandemic, which necessitated consideration that the patient might be at risk for infection with the SARS-CoV-2 virus that causes COVID-19. Institutional protocols and algorithms that pertain to the evaluation of patients at risk for COVID-19 are in a state of rapid change based on information released by regulatory bodies including the CDC and federal and state organizations. These policies and algorithms were followed during the patient's care in the ED.    As part of my medical decision making, I reviewed the following data within the electronic MEDICAL RECORD NUMBER Nursing notes reviewed and incorporated, Old chart reviewed, Radiograph reviewed , Notes from prior ED visits, and [place] Controlled Substance Database  ____________________________________________   FINAL CLINICAL IMPRESSION(S) / ED DIAGNOSES  Final diagnoses:  Wasp sting, accidental or unintentional, initial encounter  Cellulitis, unspecified cellulitis site      NEW MEDICATIONS STARTED DURING THIS VISIT:  New Prescriptions   CEPHALEXIN (KEFLEX) 500 MG CAPSULE    Take 1 capsule (500 mg total) by mouth 3 (three) times daily.   PREDNISONE (STERAPRED UNI-PAK 21 TAB) 10 MG (21) TBPK TABLET    Take 6 pills on day one then decrease by 1 pill each day     Note:  This document was prepared using Dragon voice recognition software and may include unintentional dictation errors.    Faythe Ghee, PA-C 10/09/20 1756    Shaune Pollack, MD 10/11/20 [phone]

## 2020-10-09 NOTE — Discharge Instructions (Addendum)
Follow-up with your regular doctor if not improving to 3 days.  Return emergency department worsening.  Take medications as prescribed. 

## 2020-10-09 NOTE — ED Triage Notes (Signed)
Pt comes pov with wasp sting yesterday. Pt was given epipen and benadryl yesterday by daughter. Pt states she felt better but now is having pain around the stings and down her arm. States the welts around her stings have gotten worse.

## 2020-10-09 NOTE — ED Notes (Signed)
Pt was stung yesterday by wasp, symptoms resolved with epipen at home but pt states pain and welts are worsening today.

## 2020-11-08 ENCOUNTER — Other Ambulatory Visit: Payer: Self-pay

## 2020-11-08 ENCOUNTER — Emergency Department
Admission: EM | Admit: 2020-11-08 | Discharge: 2020-11-08 | Disposition: A | Discharge status: Home / Self Care | Payer: BC Managed Care – PPO | Attending: Emergency Medicine | Admitting: Emergency Medicine

## 2020-11-08 ENCOUNTER — Emergency Department: Payer: BC Managed Care – PPO

## 2020-11-08 DIAGNOSIS — Z7984 Long term (current) use of oral hypoglycemic drugs: Secondary | ICD-10-CM | POA: Diagnosis not present

## 2020-11-08 DIAGNOSIS — R197 Diarrhea, unspecified: Secondary | ICD-10-CM | POA: Diagnosis not present

## 2020-11-08 DIAGNOSIS — L03113 Cellulitis of right upper limb: Secondary | ICD-10-CM | POA: Insufficient documentation

## 2020-11-08 DIAGNOSIS — Z79899 Other long term (current) drug therapy: Secondary | ICD-10-CM | POA: Diagnosis not present

## 2020-11-08 DIAGNOSIS — F17200 Nicotine dependence, unspecified, uncomplicated: Secondary | ICD-10-CM | POA: Diagnosis not present

## 2020-11-08 DIAGNOSIS — R059 Cough, unspecified: Secondary | ICD-10-CM | POA: Diagnosis not present

## 2020-11-08 DIAGNOSIS — Z20822 Contact with and (suspected) exposure to covid-19: Secondary | ICD-10-CM | POA: Insufficient documentation

## 2020-11-08 DIAGNOSIS — I10 Essential (primary) hypertension: Secondary | ICD-10-CM | POA: Diagnosis not present

## 2020-11-08 DIAGNOSIS — E119 Type 2 diabetes mellitus without complications: Secondary | ICD-10-CM | POA: Diagnosis not present

## 2020-11-08 DIAGNOSIS — Z7982 Long term (current) use of aspirin: Secondary | ICD-10-CM | POA: Insufficient documentation

## 2020-11-08 DIAGNOSIS — L03818 Cellulitis of other sites: Secondary | ICD-10-CM

## 2020-11-08 DIAGNOSIS — R112 Nausea with vomiting, unspecified: Secondary | ICD-10-CM | POA: Diagnosis not present

## 2020-11-08 LAB — URINALYSIS, COMPLETE (UACMP) WITH MICROSCOPIC
Bilirubin Urine: NEGATIVE
Glucose, UA: >=500 mg/dL — AB
Hgb urine dipstick: NEGATIVE
Ketones, ur: NEGATIVE mg/dL
Leukocytes,Ua: NEGATIVE
Nitrite: NEGATIVE
Protein, ur: NEGATIVE mg/dL
Specific Gravity, Urine: 1.03 (ref 1.005–1.030)
pH: 6 (ref 5.0–8.0)

## 2020-11-08 LAB — COMPREHENSIVE METABOLIC PANEL
ALT: 20 U/L (ref 0–44)
AST: 20 U/L (ref 15–41)
Albumin: 3.7 g/dL (ref 3.5–5.0)
Alkaline Phosphatase: 102 U/L (ref 38–126)
Anion gap: 6 (ref 5–15)
BUN: 20 mg/dL (ref 6–20)
CO2: 26 mmol/L (ref 22–32)
Calcium: 8.9 mg/dL (ref 8.9–10.3)
Chloride: 107 mmol/L (ref 98–111)
Creatinine, Ser: 0.9 mg/dL (ref 0.44–1.00)
GFR, Estimated: >60 mL/min (ref >60)
Glucose, Bld: 147 mg/dL — ABNORMAL HIGH (ref 70–99)
Potassium: 4.1 mmol/L (ref 3.5–5.1)
Sodium: 139 mmol/L (ref 135–145)
Total Bilirubin: 0.5 mg/dL (ref 0.3–1.2)
Total Protein: 6.7 g/dL (ref 6.5–8.1)

## 2020-11-08 LAB — CBC WITH DIFFERENTIAL/PLATELET
Abs Immature Granulocytes: 0.02 10*3/uL (ref 0.00–0.07)
Basophils Absolute: 0 10*3/uL (ref 0.0–0.1)
Basophils Relative: 0 %
Eosinophils Absolute: 0.3 10*3/uL (ref 0.0–0.5)
Eosinophils Relative: 4 %
HCT: 41.1 % (ref 36.0–46.0)
Hemoglobin: 12.9 g/dL (ref 12.0–15.0)
Immature Granulocytes: 0 %
Lymphocytes Relative: 34 %
Lymphs Abs: 2.5 10*3/uL (ref 0.7–4.0)
MCH: 26.6 pg (ref 26.0–34.0)
MCHC: 31.4 g/dL (ref 30.0–36.0)
MCV: 84.7 fL (ref 80.0–100.0)
Monocytes Absolute: 0.5 10*3/uL (ref 0.1–1.0)
Monocytes Relative: 7 %
Neutro Abs: 4 10*3/uL (ref 1.7–7.7)
Neutrophils Relative %: 55 %
Platelets: 292 10*3/uL (ref 150–400)
RBC: 4.85 MIL/uL (ref 3.87–5.11)
RDW: 14.6 % (ref 11.5–15.5)
WBC: 7.3 10*3/uL (ref 4.0–10.5)
nRBC: 0 % (ref 0.0–0.2)

## 2020-11-08 LAB — TROPONIN I (HIGH SENSITIVITY)
Troponin I (High Sensitivity): 5 ng/L (ref <18)
Troponin I (High Sensitivity): 5 ng/L (ref <18)

## 2020-11-08 LAB — RESP PANEL BY RT-PCR (FLU A&B, COVID) ARPGX2
Influenza A by PCR: NEGATIVE
Influenza B by PCR: NEGATIVE
SARS Coronavirus 2 by RT PCR: NEGATIVE

## 2020-11-08 LAB — D-DIMER, QUANTITATIVE: D-Dimer, Quant: 0.28 ug/mL-FEU (ref 0.00–0.50)

## 2020-11-08 MED ORDER — DOXYCYCLINE HYCLATE 100 MG PO CAPS: 100.0000 mg | ORAL_CAPSULE | Freq: Two times a day (BID) | ORAL | 0 refills | Status: AC

## 2020-11-08 MED ORDER — DOXYCYCLINE HYCLATE 100 MG PO CAPS: 100.0000 mg | ORAL_CAPSULE | Freq: Two times a day (BID) | ORAL | 0 refills | Status: DC

## 2020-11-08 MED ORDER — ACETAMINOPHEN 500 MG PO TABS
1000.0000 mg | ORAL_TABLET | Freq: Once | ORAL | Status: AC
  Administered 2020-11-08: 1000 mg via ORAL
  Filled 2020-11-08: qty 2

## 2020-11-08 NOTE — ED Triage Notes (Signed)
Pt to ER via Pov. Reports being stung by multiple wasps on July 3rd and required an epipen/ benadryl. Reports she was given antibiotics and doesn't think she was taking them correctly then lost her medication. Reports she thinks her right arm is infected now, today she started experiencing sharp and stabbing pains in her right shoulder that spreads into her right breast. No welts present from bee stings. Reports rash present to area.

## 2020-11-08 NOTE — ED Provider Notes (Signed)
[facility] Emergency Department Provider Note  ____________________________________________   Event Date/Time   First MD Initiated Contact with Patient 11/08/20 1734     (approximate)  I have reviewed the triage vital signs and the nursing notes.   HISTORY  Chief Complaint Foreign Body in Skin   HPI DELPHA PERKO is a 55 y.o. female with a past medical history of obesity, OSA, HTN and recent anaphylactic reaction to wasp rings in the right shoulder home and diagnosed with cellulitis the following day on 7/03.  Completed partial course of Keflex before losing it but is not sure how much she took.  She states that since then her redness and pain has returned over the back of her right shoulder and she has had some pain shooting into her chest associate with cough and some intermittent nausea vomiting and nonbloody diarrhea.  She denies any subsequent injuries, other arm or extremity pain, urinary symptoms or other clear associated sick symptoms.  No new headache area, sore throat or other acute concerns at this time.  Patient states he feels a twitching sensation in her posterior shoulder and is worried that there is still "bees in there and that it might go to my heart". denies any subsequent bee stings.         Past Medical History:  Diagnosis Date   Chest pain    negative Myoview march 2009   HTN (hypertension)    controlled   Lipoma    Hx-left leg. s/p excision   Obesity    OSA (obstructive sleep apnea)    mild   Palpitation    event monito 2/10   Rectal bleeding 5/10   normal colonoscopy. Dr. Erlene Quan infection     Patient Active Problem List   Diagnosis Date Noted   Chest pain 09/03/2016   Viral URI 01/27/2014   Hip pain 01/27/2014   Vaginitis and vulvovaginitis 01/27/2014   Other dysphagia 11/09/2013   Thyroid nodule 11/09/2013   Rhinitis 10/26/2013   Nasal lesion 04/16/2013   Routine general medical examination at a health  care facility 08/25/2012   DIABETES MELLITUS, TYPE II 07/19/2008   HYPERLIPIDEMIA 07/19/2008   ALLERGIC RHINITIS 07/19/2008   GERD 07/19/2008   PALPITATIONS 07/07/2008   OBSTRUCTIVE SLEEP APNEA 06/14/2008   OBESITY, MORBID 05/25/2008   Essential hypertension 05/25/2008   CHEST PAIN, ATYPICAL 05/25/2008    Past Surgical History:  Procedure Laterality Date   ABDOMINAL HYSTERECTOMY     hysterectomy unspecified area      Prior to Admission medications   Medication Sig Start Date End Date Taking? Authorizing Provider  doxycycline (VIBRAMYCIN) 100 MG capsule Take 1 capsule (100 mg total) by mouth 2 (two) times daily for 7 days. 11/08/20 11/15/20 Yes Gilles Chiquito, MD  albuterol (PROVENTIL) (2.5 MG/3ML) 0.083% nebulizer solution Take 3 mLs (2.5 mg total) by nebulization every 6 (six) hours as needed for wheezing or shortness of breath. 01/01/20   Couture, Cortni S, PA-C  aspirin EC 81 MG tablet Take 81 mg by mouth daily.    [provider]  EPINEPHrine 0.3 mg/0.3 mL IJ SOAJ injection Inject 0.3 mg into the skin daily as needed (allergies).  05/19/14   [provider]  fluticasone (FLONASE) 50 MCG/ACT nasal spray Place 2 sprays into both nostrils daily. 12/01/14   Ward, Layla Maw, DO  furosemide (LASIX) 40 MG tablet Take 1 tablet (40 mg total) by mouth daily for 15 days. 09/24/19 10/09/19  Dionne Bucy, MD  metFORMIN (GLUCOPHAGE-XR) 500 MG 24 hr tablet 2 tabs by mouth once daily. Patient taking differently: Take 1,000 mg by mouth daily.  11/25/12   Sandford Craze, NP  nebivolol (BYSTOLIC) 5 MG tablet Take 1 tablet (5 mg total) by mouth daily. 09/04/16   Adrian Saran, MD  omeprazole (PRILOSEC) 20 MG capsule Take 1 capsule (20 mg total) by mouth daily as needed (FOR ACID REFLUX). 02/04/19   Willy Eddy, MD  omeprazole (PRILOSEC) 20 MG capsule Take 1 capsule (20 mg total) by mouth daily. 12/21/19   Horton, Mayer Masker, MD  potassium chloride (MICRO-K) 10 MEQ CR capsule  Take 1 capsule (10 mEq total) by mouth daily. 09/04/16   Adrian Saran, MD  predniSONE (STERAPRED UNI-PAK 21 TAB) 10 MG (21) TBPK tablet Take 6 pills on day one then decrease by 1 pill each day 10/09/20   Faythe Ghee, PA-C  sucralfate (CARAFATE) 1 g tablet Take 1 tablet (1 g total) by mouth 3 (three) times daily as needed. 02/04/19 02/04/20  Willy Eddy, MD    Allergies Sulfonamide derivatives  Family History  Problem Relation Age of Onset   Allergies Sister        and daughter   Allergy (severe) Sister    Heart attack Sister    Asthma Daughter    Allergy (severe) Daughter    Coronary artery disease Other        female 1st degree relative <60/ female 1st degree <50   Stroke Other        female 1st degree relatvie <50   Heart disease Mother    Heart attack Mother    Other Mother        rheumatism   Heart disease Father    Heart disease Paternal Grandfather    Heart attack Paternal Grandfather    Stroke Paternal Grandfather    Cancer Paternal Grandfather        prostate   Prostate cancer Maternal Grandfather    Other Maternal Grandmother        rheumatism    Social History Social History   Tobacco Use   Smoking status: Some Days   Smokeless tobacco: Never  Substance Use Topics   Alcohol use: Yes    Comment: occasional   Drug use: No    Review of Systems  Review of Systems  Constitutional:  Negative for chills and fever.  HENT:  Negative for sore throat.   Eyes:  Negative for pain.  Respiratory:  Positive for cough and shortness of breath. Negative for stridor.   Cardiovascular:  Positive for chest pain.  Gastrointestinal:  Positive for diarrhea, nausea and vomiting.  Genitourinary:  Negative for dysuria.  Musculoskeletal:  Positive for back pain (backside of R shoulder) and myalgias (R shoulder, upper back).  Skin:  Negative for rash.  Neurological:  Negative for seizures, loss of consciousness and headaches.  Psychiatric/Behavioral:  Negative for suicidal  ideas.   All other systems reviewed and are negative.    ____________________________________________   PHYSICAL EXAM:  VITAL SIGNS: ED Triage Vitals  Enc Vitals Group     BP 11/08/20 1642 106/78     Pulse Rate 11/08/20 1642 80     Resp 11/08/20 1642 17     Temp 11/08/20 1642 99.1 F (37.3 C)     Temp Source 11/08/20 1642 Oral     SpO2 11/08/20 1642 97 %     Weight 11/08/20 1643 (!) 309 lb (140.2 kg)  Height 11/08/20 1643 5\' 5"  (1.651 m)     Head Circumference --      Peak Flow --      Pain Score 11/08/20 1643 6     Pain Loc --      Pain Edu? --      Excl. in GC? --    Vitals:   11/08/20 1642  BP: 106/78  Pulse: 80  Resp: 17  Temp: 99.1 F (37.3 C)  SpO2: 97%   Physical Exam Vitals and nursing note reviewed.  Constitutional:      General: She is not in acute distress.    Appearance: She is well-developed.  HENT:     Head: Normocephalic and atraumatic.     Right Ear: External ear normal.     Left Ear: External ear normal.     Nose: Nose normal.     Mouth/Throat:     Mouth: Mucous membranes are moist.  Eyes:     Conjunctiva/sclera: Conjunctivae normal.  Cardiovascular:     Rate and Rhythm: Normal rate and regular rhythm.     Pulses: Normal pulses.     Heart sounds: No murmur heard. Pulmonary:     Effort: Pulmonary effort is normal. No respiratory distress.     Breath sounds: Normal breath sounds.  Abdominal:     Palpations: Abdomen is soft.     Tenderness: There is no abdominal tenderness.  Musculoskeletal:     Cervical back: Neck supple.  Skin:    General: Skin is warm and dry.     Capillary Refill: Capillary refill takes less than 2 seconds.  Neurological:     Mental Status: She is alert and oriented to person, place, and time.  Psychiatric:        Mood and Affect: Mood normal.    There is some blanchable erythema over the posterior right shoulder.  There is no shoulder effusion or limitation range of motion.  2+ radial pulse.  Sensation is  intact in the distribution of the ulnar radial and median nerves.  No other overlying skin changes. ____________________________________________   LABS (all labs ordered are listed, but only abnormal results are displayed)  Labs Reviewed  COMPREHENSIVE METABOLIC PANEL - Abnormal; Notable for the following components:      Result Value   Glucose, Bld 147 (*)    All other components within normal limits  URINALYSIS, COMPLETE (UACMP) WITH MICROSCOPIC - Abnormal; Notable for the following components:   Color, Urine YELLOW (*)    APPearance CLEAR (*)    Glucose, UA >=500 (*)    Bacteria, UA RARE (*)    All other components within normal limits  RESP PANEL BY RT-PCR (FLU A&B, COVID) ARPGX2  CBC WITH DIFFERENTIAL/PLATELET  D-DIMER, QUANTITATIVE (NOT AT [facility])  TROPONIN I (HIGH SENSITIVITY)  TROPONIN I (HIGH SENSITIVITY)   ____________________________________________  EKG  Rhythm with a ventricular rate of 69, normal axis, unremarkable intervals without clearance of acute ischemia or significant arrhythmia. ____________________________________________  RADIOLOGY  ED MD interpretation: Chest x-ray has no focal consolidation, effusion, edema, pneumothorax or any other clear acute intrathoracic process.  Official radiology report(s): DG Chest 2 View  Result Date: 11/08/2020 CLINICAL DATA:  Cough, short of breath EXAM: CHEST - 2 VIEW COMPARISON:  10/09/2020 FINDINGS: Frontal and lateral views of the chest demonstrate an unremarkable cardiac silhouette. No airspace disease, effusion, or pneumothorax. No acute bony abnormalities. IMPRESSION: 1. No acute intrathoracic process. Electronically Signed   By: 12/10/2020.D.  On: 11/08/2020 20:09    ____________________________________________   PROCEDURES  Procedure(s) performed (including Critical Care):  Procedures   ____________________________________________   INITIAL IMPRESSION / ASSESSMENT AND PLAN / ED COURSE       Presents with above-stated exam for assessment of some persistent worsening soreness redness and sensation of foreign body or tingling and movement around her posterior right shoulder.  She states she completed a partial course of antibiotics after being stung by a bee and suffering for anaphylaxis after being diagnosed with subsequent cellulitis.  She also is endorsing some chest pain cough shortness of breath nausea vomiting diarrhea today.  On arrival she is afebrile hemodynamically stable.  She does have some erythema over the posterior right shoulder but no effusion limitation range of motion or other areas of significant tenderness.  She is otherwise neurovascular intact throughout the extremity.  On exam she has findings on the skin most consistent with cellulitis.  Exam is not suggestive of septic joint.  In addition she has no fever or leukocytosis.  Given she is stating the pain feels like it is rating into her chest and is endorsing some chest pain shortness of breath and cough additional differential includes possible PE, pneumonia, symptomatic effusion, pleurisy and atypical presentation for ACS.  COVID and flu PCR is negative.  CMP shows no significant electrolyte or metabolic derangements.  BC shows no leukocytosis or acute anemia.  ECG and nonelevated troponin x2 are not suggestive of ACS or myocarditis.  D-dimer is 0.28 and not consistent with PE.  This x-ray shows no evidence of effusion pneumonia or pneumothorax.  Unclear if patient experiencing some muscle spasms versus possible neuropathy underneath her cellulitis.  Suspect she may have a component of viral gastroenteritis and bronchitis as well given reassuring chest x-ray exam and work-up.  Will defer antiemetic as patient is able to take crackers and peanut butter without any difficulty and is laughing watching TV on my reassessment.  I have a low suspicion for abscess abscess or other immediate life-threatening process at this time.   Will write short course of doxycycline.  Recommend patient follow-up with a PCP.  Discharged stable condition.  Strict return precautions advised and discussed.          ____________________________________________   FINAL CLINICAL IMPRESSION(S) / ED DIAGNOSES  Final diagnoses:  Cellulitis of other specified site  Cough  Nausea vomiting and diarrhea    Medications  acetaminophen (TYLENOL) tablet 1,000 mg (1,000 mg Oral Given 11/08/20 1824)     ED Discharge Orders          Ordered    doxycycline (VIBRAMYCIN) 100 MG capsule  2 times daily        11/08/20 1959             Note:  This document was prepared using Dragon voice recognition software and may include unintentional dictation errors.    Gilles ChiquitoSmith, Erline Siddoway P, MD 11/08/20 2019

## 2021-07-02 ENCOUNTER — Emergency Department: Payer: BC Managed Care – PPO

## 2021-07-02 ENCOUNTER — Emergency Department
Admission: EM | Admit: 2021-07-02 | Discharge: 2021-07-02 | Disposition: A | Discharge status: Home / Self Care | Payer: BC Managed Care – PPO | Attending: Emergency Medicine | Admitting: Emergency Medicine

## 2021-07-02 ENCOUNTER — Other Ambulatory Visit: Payer: Self-pay

## 2021-07-02 DIAGNOSIS — I10 Essential (primary) hypertension: Secondary | ICD-10-CM | POA: Insufficient documentation

## 2021-07-02 DIAGNOSIS — S0990XA Unspecified injury of head, initial encounter: Secondary | ICD-10-CM | POA: Diagnosis present

## 2021-07-02 DIAGNOSIS — W228XXA Striking against or struck by other objects, initial encounter: Secondary | ICD-10-CM | POA: Insufficient documentation

## 2021-07-02 DIAGNOSIS — S0081XA Abrasion of other part of head, initial encounter: Secondary | ICD-10-CM | POA: Insufficient documentation

## 2021-07-02 DIAGNOSIS — E119 Type 2 diabetes mellitus without complications: Secondary | ICD-10-CM | POA: Insufficient documentation

## 2021-07-02 NOTE — ED Notes (Signed)
Pt verbalized understanding of discharge instructions and follow-up care instructions. Pt advised if symptoms worsen return to ED.  ?

## 2021-07-02 NOTE — ED Provider Notes (Signed)
? ?[facility] ?Provider Note ? ? ? Event Date/Time  ? First MD Initiated Contact with Patient 07/02/21 2220   ?  (approximate) ? ? ?History  ? ?Chief Complaint ?Head Injury ? ? ?HPI ?[name] is a 56 y.o. female, history of diabetes, hyperlipidemia, obesity, OSA, GERD, hypertension, presents to the emergency department for evaluation of head injury.  Patient states that she actually hit her head on the bottom of the trunk door of her car approximately 1 hour ago.  Denies LOC.  Denies blood thinner use.  She states that she currently has a headache at this time.  She has taken Tylenol with minimal relief.  She states that she is concerned for bleeding in her brain.  Denies fever/chills, visual disturbances, hearing changes, neck pain, nausea/vomiting, abdominal pain, back pain, or numbness/tingling in upper or lower extremities. ? ?History Limitations: No limitations. ? ?  ? ? ?Physical Exam  ?Triage Vital Signs: ?ED Triage Vitals  ?Enc Vitals Group  ?   BP 07/02/21 2213 (!) 145/87  ?   Pulse Rate 07/02/21 2213 81  ?   Resp 07/02/21 2213 18  ?   Temp 07/02/21 2213 98.4 ?F (36.9 ?C)  ?   Temp Source 07/02/21 2213 Oral  ?   SpO2 07/02/21 2213 99 %  ?   Weight 07/02/21 2214 (!) 310 lb (140.6 kg)  ?   Height 07/02/21 2214 5\' 5"  (1.651 m)  ?   Head Circumference --   ?   Peak Flow --   ?   Pain Score 07/02/21 2214 10  ?   Pain Loc --   ?   Pain Edu? --   ?   Excl. in [place]? --   ? ? ?Most recent vital signs: ?Vitals:  ? 07/02/21 2213  ?BP: (!) 145/87  ?Pulse: 81  ?Resp: 18  ?Temp: 98.4 ?F (36.9 ?C)  ?SpO2: 99%  ? ? ?General: Awake, NAD.  ?Skin: Warm, dry.  ?CV: Good peripheral perfusion.  ?Resp: Normal effort.  ?Abd: Soft, non-tender. No distention.  ?Neuro: At baseline. No gross neurological deficits.  ?Other: Mild abrasion and swelling appreciated just above the left eyebrow.  PERRL.  EOMI. ? ?Physical Exam ? ? ? ?ED Results / Procedures / Treatments  ?Labs ?(all labs ordered are listed, but  only abnormal results are displayed) ?Labs Reviewed - No data to display ? ? ?EKG ?Not applicable. ? ? ?RADIOLOGY ? ?ED Provider Interpretation: I personally reviewed this CT scan, no evidence of acute intracranial pathology. ? ?CT Head Wo Contrast ? ?Result Date: 07/02/2021 ?CLINICAL DATA:  Head trauma. EXAM: CT HEAD WITHOUT CONTRAST TECHNIQUE: Contiguous axial images were obtained from the base of the skull through the vertex without intravenous contrast. RADIATION DOSE REDUCTION: This exam was performed according to the departmental dose-optimization program which includes automated exposure control, adjustment of the mA and/or kV according to patient size and/or use of iterative reconstruction technique. COMPARISON:  Head CT dated 12/29/2016. FINDINGS: Brain: The ventricles and sulci are appropriate size for the patient's age. The gray-white matter discrimination is preserved. There is no acute intracranial hemorrhage. No mass effect or midline shift. No extra-axial fluid collection. Vascular: No hyperdense vessel or unexpected calcification. Skull: Normal. Negative for fracture or focal lesion. Sinuses/Orbits: There is opacification of the right sphenoid sinus. The remainder of the visualized paranasal sinuses and mastoid air cells are clear. Other: None IMPRESSION: No acute intracranial pathology. Electronically Signed   By: Milas Hock  Radparvar M.D.   On: 07/02/2021 22:44   ? ?PROCEDURES: ? ?Critical Care performed: None. ? ?Procedures ? ? ? ?MEDICATIONS ORDERED IN ED: ?Medications - No data to display ? ? ?IMPRESSION / MDM / ASSESSMENT AND PLAN / ED COURSE  ?I reviewed the triage vital signs and the nursing notes. ?             ?               ? ? ?Differential diagnosis includes, but is not limited to, head injury, concussion, epidural/subdural hematoma, scalp hematoma. ? ?ED Course ?Patient appears well.  Vital signs within normal limits.  NAD. ? ?Head CT shows no evidence of acute intracranial  pathology. ? ?Assessment/Plan ?Patient presents with head injury.  CT reassuring for no evidence of acute intracranial injury.  Advised patient that she may have suffered from a concussion.  We will provide her with educational material.  Advised her to follow-up with her primary care provider as needed if she begins to experience these symptoms.  Encouraged her to take Tylenol/ibuprofen as needed for headaches.  We will plan to discharge. ? ?Patient was provided with anticipatory guidance, return precautions, and educational material. Encouraged the patient to return to the emergency department at any time if they begin to experience any new or worsening symptoms.  ? ?  ? ? ?FINAL CLINICAL IMPRESSION(S) / ED DIAGNOSES  ? ?Final diagnoses:  ?Injury of head, initial encounter  ? ? ? ?Rx / DC Orders  ? ?ED Discharge Orders   ? ? None  ? ?  ? ? ? ?Note:  This document was prepared using Dragon voice recognition software and may include unintentional dictation errors. ?  ?Teodoro Spray, [place] ?07/02/21 2256 ? ?  ?Harvest Dark, MD ?07/02/21 2319 ? ?

## 2021-07-02 NOTE — ED Notes (Signed)
Pt states that she hit her left side while opening the trunk of her ear. Pt denies LOC. Left side of forehead does appear slightly swollen. Skin is intact no signs of bleeding.  ?

## 2021-07-02 NOTE — Discharge Instructions (Addendum)
-  Take Tylenol/ibuprofen as needed for pain. ?-Follow-up with your primary care provider as needed if you begin to experience any symptoms of concussion. ?-Return to the emergency department anytime if you begin to experience any new or worsening symptoms. ?

## 2021-07-02 NOTE — ED Triage Notes (Signed)
Hit head on bottom of trunk of car. Small abrasion noted to left eyebrow. No obvious injuries noted to top of head. Denies LOC ?

## 2022-04-18 ENCOUNTER — Emergency Department: Payer: BC Managed Care – PPO

## 2022-04-18 ENCOUNTER — Other Ambulatory Visit: Payer: Self-pay

## 2022-04-18 ENCOUNTER — Emergency Department
Admission: EM | Admit: 2022-04-18 | Discharge: 2022-04-18 | Disposition: A | Discharge status: Home / Self Care | Payer: BC Managed Care – PPO | Attending: Emergency Medicine | Admitting: Emergency Medicine

## 2022-04-18 DIAGNOSIS — R0602 Shortness of breath: Secondary | ICD-10-CM

## 2022-04-18 DIAGNOSIS — R051 Acute cough: Secondary | ICD-10-CM

## 2022-04-18 DIAGNOSIS — I10 Essential (primary) hypertension: Secondary | ICD-10-CM | POA: Diagnosis not present

## 2022-04-18 DIAGNOSIS — E119 Type 2 diabetes mellitus without complications: Secondary | ICD-10-CM | POA: Insufficient documentation

## 2022-04-18 DIAGNOSIS — U071 COVID-19: Secondary | ICD-10-CM | POA: Insufficient documentation

## 2022-04-18 LAB — COMPREHENSIVE METABOLIC PANEL
ALT: 21 U/L (ref 0–44)
AST: 19 U/L (ref 15–41)
Albumin: 3.9 g/dL (ref 3.5–5.0)
Alkaline Phosphatase: 107 U/L (ref 38–126)
Anion gap: 8 (ref 5–15)
BUN: 21 mg/dL — ABNORMAL HIGH (ref 6–20)
CO2: 24 mmol/L (ref 22–32)
Calcium: 8.8 mg/dL — ABNORMAL LOW (ref 8.9–10.3)
Chloride: 103 mmol/L (ref 98–111)
Creatinine, Ser: 0.83 mg/dL (ref 0.44–1.00)
GFR, Estimated: >60 mL/min (ref >60)
Glucose, Bld: 132 mg/dL — ABNORMAL HIGH (ref 70–99)
Potassium: 3.7 mmol/L (ref 3.5–5.1)
Sodium: 135 mmol/L (ref 135–145)
Total Bilirubin: 0.4 mg/dL (ref 0.3–1.2)
Total Protein: 7.5 g/dL (ref 6.5–8.1)

## 2022-04-18 LAB — CBC
HCT: 45.3 % (ref 36.0–46.0)
Hemoglobin: 14 g/dL (ref 12.0–15.0)
MCH: 25.1 pg — ABNORMAL LOW (ref 26.0–34.0)
MCHC: 30.9 g/dL (ref 30.0–36.0)
MCV: 81.2 fL (ref 80.0–100.0)
Platelets: 359 10*3/uL (ref 150–400)
RBC: 5.58 MIL/uL — ABNORMAL HIGH (ref 3.87–5.11)
RDW: 15 % (ref 11.5–15.5)
WBC: 12.1 10*3/uL — ABNORMAL HIGH (ref 4.0–10.5)
nRBC: 0 % (ref 0.0–0.2)

## 2022-04-18 MED ORDER — IOHEXOL 350 MG/ML SOLN
75.0000 mL | Freq: Once | INTRAVENOUS | Status: AC | PRN
  Administered 2022-04-18: 75 mL via INTRAVENOUS

## 2022-04-18 MED ORDER — TRAMADOL HCL 50 MG PO TABS: 50.0000 mg | ORAL_TABLET | Freq: Four times a day (QID) | ORAL | 0 refills | Status: AC | PRN

## 2022-04-18 MED ORDER — IPRATROPIUM-ALBUTEROL 0.5-2.5 (3) MG/3ML IN SOLN
3.0000 mL | Freq: Once | RESPIRATORY_TRACT | Status: AC
  Administered 2022-04-18: 3 mL via RESPIRATORY_TRACT
  Filled 2022-04-18: qty 3

## 2022-04-18 MED ORDER — HYDROCORTISONE 0.5 % EX CREA: 1.0000 | TOPICAL_CREAM | Freq: Two times a day (BID) | CUTANEOUS | 0 refills | Status: AC

## 2022-04-18 NOTE — ED Triage Notes (Signed)
Pt here with SOB for a few days. Pt has cough and burning in her fingers, pt here for eval of a PE. Pt had covid last week and was taking paxlovid.

## 2022-04-18 NOTE — Discharge Instructions (Signed)
You can use the hydrocortisone cream on the hands and the pain medicine if needed for pain that is not relieved by Tylenol or other over-the-counter medication.  Return to the ER for new, worsening, or persistent severe shortness of breath, cough, fever, weakness, hand pain or swelling, numbness, or any other new or worsening symptoms that concern you.

## 2022-04-18 NOTE — ED Provider Notes (Signed)
[facility] Provider Note    Event Date/Time   First MD Initiated Contact with Patient 04/18/22 1414     (approximate)   History   Shortness of Breath   HPI  [name] is a 57 y.o. female past medical history of OSA, hypertension who presents for evaluation of pulmonary embolism.  Patient has been sick for about a week.  She has had cough runny nose and shortness of breath.  Denies chest pain but was having some chest pain with coughing 2 days ago.  No swelling in her left leg which is actually improved today but she did notice some swelling of the left ankle which is new.  Denies history of DVT/PE but does have family history.  Denies hemoptysis recent surgery estrogen use or history of cancer.  Patient has been seen by her primary doctor was diagnosed with COVID on 1/4.  She has completed a course of Paxlovid and was prescribed paxlovid Tessalon Perles and Atrovent inhaler.  Lab work from [employer] shows a D-dimer that was positive at 781 with normal being less than 500.  She was told to come to the emergency department for CT to rule out pulmonary embolism     Past Medical History:  Diagnosis Date   Chest pain    negative Myoview march 2009   HTN (hypertension)    controlled   Lipoma    Hx-left leg. s/p excision   Obesity    OSA (obstructive sleep apnea)    mild   Palpitation    event [place] 2/10   Rectal bleeding 5/10   normal colonoscopy. Dr. Candie Chroman infection     Patient Active Problem List   Diagnosis Date Noted   Chest pain 09/03/2016   Viral URI 01/27/2014   Hip pain 01/27/2014   Vaginitis and vulvovaginitis 01/27/2014   Other dysphagia 11/09/2013   Thyroid nodule 11/09/2013   Rhinitis 10/26/2013   Nasal lesion 04/16/2013   Routine general medical examination at a health care facility 08/25/2012   DIABETES MELLITUS, TYPE II 07/19/2008   HYPERLIPIDEMIA 07/19/2008   ALLERGIC RHINITIS 07/19/2008   GERD 07/19/2008   PALPITATIONS  07/07/2008   OBSTRUCTIVE SLEEP APNEA 06/14/2008   OBESITY, MORBID 05/25/2008   Essential hypertension 05/25/2008   CHEST PAIN, ATYPICAL 05/25/2008     Physical Exam  Triage Vital Signs: ED Triage Vitals  Enc Vitals Group     BP 04/18/22 1234 (!) 164/111     Pulse Rate 04/18/22 1234 95     Resp 04/18/22 1234 18     Temp 04/18/22 1234 98.2 F (36.8 C)     Temp Source 04/18/22 1234 Oral     SpO2 04/18/22 1234 98 %     Weight 04/18/22 1235 (!) 309 lb 15.5 oz (140.6 kg)     Height 04/18/22 1235 5\' 5"  (1.651 m)     Head Circumference --      Peak Flow --      Pain Score 04/18/22 1235 0     Pain Loc --      Pain Edu? --      Excl. in [place]? --     Most recent vital signs: Vitals:   04/18/22 1234  BP: (!) 164/111  Pulse: 95  Resp: 18  Temp: 98.2 F (36.8 C)  SpO2: 98%     General: Awake, no distress.  CV:  Good peripheral perfusion. No pitting edema Resp:  Normal effort. Lungs are  Abd:  No distention.  Neuro:             Awake, Alert, Oriented x 3  Other:     ED Results / Procedures / Treatments  Labs (all labs ordered are listed, but only abnormal results are displayed) Labs Reviewed  CBC - Abnormal; Notable for the following components:      Result Value   WBC 12.1 (*)    RBC 5.58 (*)    MCH 25.1 (*)    All other components within normal limits  COMPREHENSIVE METABOLIC PANEL - Abnormal; Notable for the following components:   Glucose, Bld 132 (*)    BUN 21 (*)    Calcium 8.8 (*)    All other components within normal limits  D-DIMER, QUANTITATIVE (NOT AT [facility])     EKG  EKG interpretation performed by myself: NSR, nml axis, nml intervals, no acute ischemic changes    RADIOLOGY I reviewed and interpreted the CXR which does not show any acute cardiopulmonary process    PROCEDURES:  Critical Care performed: No  Procedures   MEDICATIONS ORDERED IN ED: Medications  ipratropium-albuterol (DUONEB) 0.5-2.5 (3) MG/3ML nebulizer solution 3 mL (has  no administration in time range)     IMPRESSION / MDM / ASSESSMENT AND PLAN / ED COURSE  I reviewed the triage vital signs and the nursing notes.                              Patient's presentation is most consistent with acute presentation with potential threat to life or bodily function.  Differential diagnosis includes, but is not limited to, COVID-pneumonia, bronchitis, pleurisy, bacterial pneumonia, pulmonary embolism  Patient is a 57 year old female who was recently diagnosed with COVID about 6 days ago after presenting with cough and upper respiratory symptoms.  She has had ongoing cough and dyspnea despite taking Paxlovid and over-the-counter medication and is now sent by primary care for evaluation of PE.  On 1/4 she had a D-dimer sent that was slightly positive at 751.  She is not hypoxic here is not tachycardic overall looks well lungs clear no signs of DVT.  COVID is certainly risk factors she has no other independent risk factors for PE.  Given D-dimer was positive we will proceed right to CTA although my suspicion clinically is low.  Patient signed out to oncoming provider pending results of CTA imaging.       FINAL CLINICAL IMPRESSION(S) / ED DIAGNOSES   Final diagnoses:  SOB (shortness of breath)  Acute cough     Rx / DC Orders   ED Discharge Orders     None        Note:  This document was prepared using Dragon voice recognition software and may include unintentional dictation errors.   Rada Hay, MD 04/18/22 [phone]

## 2022-04-18 NOTE — ED Notes (Signed)
Assessing for IV placement; pt may need [place] IV. Pt's resp reg/unlabored, skin dry and calmly sitting on stretcher currently; reports feeling SOB at times; in NAD. 100% RA.

## 2022-04-18 NOTE — ED Provider Triage Note (Signed)
Emergency Medicine Provider Triage Evaluation Note  [name] , a 57 y.o. female  was evaluated in triage.  Pt complains of with complaint of shortness of breath for several days.  Patient had covid last week and was seen by her PCP 1/8 and told she did not have pneumonia.  Started on paxlovid,  prednisone and doxycycline.  Was told by PCP to go to ED to rule out PE.    Review of Systems  Positive: Asthma, covid,  Negative:   Physical Exam  BP (!) 164/111 (BP Location: Left Arm)   Pulse 95   Temp 98.2 F (36.8 C) (Oral)   Resp 18   Ht 5\' 5"  (1.651 m)   Wt (!) 140.6 kg   SpO2 98%   BMI 51.58 kg/m  Gen:   Awake, no distress  Appears SOB with talking Resp:  Normal effort, expiratory wheeze noted.  MSK:   Moves extremities without difficulty  Other:    Medical Decision Making  Medically screening exam initiated at 12:35 PM.  Appropriate orders placed.  [name] was informed that the remainder of the evaluation will be completed by another provider, this initial triage assessment does not replace that evaluation, and the importance of remaining in the ED until their evaluation is complete.     Johnn Hai, PA-C 04/18/22 1241

## 2022-04-18 NOTE — ED Notes (Signed)
Pt up to restroom; pt steady.

## 2022-04-18 NOTE — ED Provider Notes (Signed)
-----------------------------------------   5:39 PM on 04/18/2022 -----------------------------------------  I took over care of this patient from Dr. Starleen Blue.  CT angio shows no evidence of PE or other acute findings.  The patient is stable for discharge home.  She continues to report bilateral hand pain which is most consistent with dermatitis or other benign inflammatory or allergic cause.  I prescribed hydrocortisone cream and some analgesia.  I gave her strict return precautions and she expressed understanding.   Arta Silence, MD 04/18/22 1740

## 2022-04-20 ENCOUNTER — Emergency Department
Admission: EM | Admit: 2022-04-20 | Discharge: 2022-04-20 | Disposition: A | Discharge status: Home / Self Care | Payer: BC Managed Care – PPO | Attending: Emergency Medicine | Admitting: Emergency Medicine

## 2022-04-20 ENCOUNTER — Emergency Department: Payer: BC Managed Care – PPO

## 2022-04-20 ENCOUNTER — Other Ambulatory Visit: Payer: Self-pay

## 2022-04-20 DIAGNOSIS — I808 Phlebitis and thrombophlebitis of other sites: Secondary | ICD-10-CM | POA: Insufficient documentation

## 2022-04-20 DIAGNOSIS — M79642 Pain in left hand: Secondary | ICD-10-CM | POA: Diagnosis present

## 2022-04-20 MED ORDER — GABAPENTIN 300 MG PO CAPS
300.0000 mg | ORAL_CAPSULE | Freq: Once | ORAL | Status: AC
  Administered 2022-04-20: 300 mg via ORAL
  Filled 2022-04-20: qty 1

## 2022-04-20 MED ORDER — HYDROCODONE-ACETAMINOPHEN 5-325 MG PO TABS: 1.0000 | ORAL_TABLET | Freq: Three times a day (TID) | ORAL | 0 refills | Status: AC | PRN

## 2022-04-20 MED ORDER — GABAPENTIN 100 MG PO CAPS: 100.0000 mg | ORAL_CAPSULE | Freq: Every day | ORAL | 0 refills | Status: AC

## 2022-04-20 NOTE — Discharge Instructions (Addendum)
You may follow your ultrasound results on [facility] MyChart. Take the prescription meds as directed. Apply warm compresses. Follow-up with your provider as needed.

## 2022-04-20 NOTE — ED Triage Notes (Signed)
Pt presents to ED with /co of L hand pain. Pt states US IV was placed and states she was told by the nurse that they may have hit a nerve. Pt has bruise to L AC/FA area.   Cap refill 3 seconds, palm of L hand is warmer than the right hand.

## 2022-04-20 NOTE — ED Provider Notes (Signed)
[facility] Emergency Department Provider Note     Event Date/Time   First MD Initiated Contact with Patient 04/20/22 1714     (approximate)   History   Hand Pain   HPI  [name] is a 57 y.o. female returns to the ED for evaluation of paresthesias to her left hand, after she had a 20-gauge IV placed in her proximal left forearm on 1/12.  She notes that the IV team had to use an ultrasound to place the IV.  She recalls that the IV team nurse, remarked that the IV was placed in the vein next to the nerve in her forearm.  She presents today noting some local ecchymosis to the IV site.  She also would endorse some distal hand paresthesias and increased warmth to the fingers. She used warm compresses to resolve some local induration. She denies any ongoing chest pain, with a negative CT chest angio.  Physical Exam   Triage Vital Signs: ED Triage Vitals  Enc Vitals Group     BP 04/20/22 1548 (!) 181/100     Pulse Rate 04/20/22 1548 83     Resp 04/20/22 1548 19     Temp 04/20/22 1548 98.3 F (36.8 C)     Temp Source 04/20/22 1548 Oral     SpO2 04/20/22 1548 98 %     Weight --      Height --      Head Circumference --      Peak Flow --      Pain Score 04/20/22 1550 8     Pain Loc --      Pain Edu? --      Excl. in [place]? --     Most recent vital signs: Vitals:   04/20/22 1548  BP: (!) 181/100  Pulse: 83  Resp: 19  Temp: 98.3 F (36.8 C)  SpO2: 98%    General Awake, no distress. NAD CV:  Good peripheral perfusion. No CCE distally.  Wrist capillary refill noted. RESP:  Normal effort.  ABD:  No distention.  MSK:  Normal composite fist distally SKIN:  Skin is warm and dry.  Left upper extremity with a 5 cm area of ecchymosis to the volar forearm, with evidence of needlestick site centrally.  No increased warmth is appreciated.  Mild duration is noted.   ED Results / Procedures / Treatments   Labs (all labs ordered are listed, but only  abnormal results are displayed) Labs Reviewed - No data to display   EKG   RADIOLOGY  I personally viewed and evaluated these images as part of my medical decision making, as well as reviewing the written report by the radiologist.  ED Provider Interpretation: no acute findings  US Venous Img Upper Uni Left  Result Date: 04/20/2022 CLINICAL DATA:  Left upper extremity pain and swelling following prior IV placement EXAM: LEFT UPPER EXTREMITY VENOUS DOPPLER ULTRASOUND TECHNIQUE: Gray-scale sonography with graded compression, as well as color Doppler and duplex ultrasound were performed to evaluate the upper extremity deep venous system from the level of the subclavian vein and including the jugular, axillary, basilic, radial, ulnar and upper cephalic vein. Spectral Doppler was utilized to evaluate flow at rest and with distal augmentation maneuvers. COMPARISON:  None Available. FINDINGS: Contralateral Subclavian Vein: Respiratory phasicity is normal and symmetric with the symptomatic side. No evidence of thrombus. Normal compressibility. Internal Jugular Vein: No evidence of thrombus. Normal compressibility, respiratory phasicity and response to augmentation.  Subclavian Vein: No evidence of thrombus. Normal compressibility, respiratory phasicity and response to augmentation. Axillary Vein: No evidence of thrombus. Normal compressibility, respiratory phasicity and response to augmentation. Cephalic Vein: No evidence of thrombus. Normal compressibility, respiratory phasicity and response to augmentation. Basilic Vein: No evidence of thrombus. Normal compressibility, respiratory phasicity and response to augmentation. Brachial Veins: No evidence of thrombus. Normal compressibility, respiratory phasicity and response to augmentation. Radial Veins: No evidence of thrombus. Normal compressibility, respiratory phasicity and response to augmentation. Ulnar Veins: No evidence of thrombus. Normal compressibility,  respiratory phasicity and response to augmentation. Venous Reflux:  None visualized. Other Findings: There is thrombus noted within a superficial vein just below the antecubital region where prior IV site was placed and currently shows bruising. No significant extension proximally is noted. IMPRESSION: Superficial thrombophlebitis related to prior IV site in the upper forearm. No other focal abnormality is noted. Electronically Signed   By: Inez Catalina M.D.   On: 04/20/2022 21:07   DG Forearm Left  Result Date: 04/20/2022 CLINICAL DATA:  LEFT forearm pain following blood draw. EXAM: LEFT FOREARM - 2 VIEW COMPARISON:  None Available. FINDINGS: No acute fracture, subluxation or dislocation noted. No radiographic evidence of osteomyelitis noted. No radiopaque foreign bodies are noted. IMPRESSION: No bony abnormality. Electronically Signed   By: Margarette Canada M.D.   On: 04/20/2022 16:35     PROCEDURES:  Critical Care performed: No  Procedures   MEDICATIONS ORDERED IN ED: Medications  gabapentin (NEURONTIN) capsule 300 mg (300 mg Oral Given 04/20/22 1953)     IMPRESSION / MDM / ASSESSMENT AND PLAN / ED COURSE  I reviewed the triage vital signs and the nursing notes.                              Differential diagnosis includes, but is not limited to, phlebitis, thrombophlebitis, ecchymosis, cellulitis, hematoma  Patient's presentation is most consistent with acute complicated illness / injury requiring diagnostic workup.  Patient to the ED for evaluation of bruising to prior IV site.  She reports some intermittent distal hand paresthesias and pain over the IV site.  Patient is evaluated for complaints, found to have a clinical evidence of a likely thrombophlebitis secondary to IV insertion.  Patient is further evaluated with plain films ordered in triage, which did not reveal any acute bony injury based on interpretation.  Ultrasound of the LUE, does confirm superficial thrombophlebitis in the  area of the IV insertion.  Patient's diagnosis is consistent with superficial thrombophlebitis. Patient will be discharged home with prescriptions for gabapentin and #9 hydrocodone.  She is also advised to take OTC ibuprofen and continue to apply warm compresses to the area.  Patient is to follow up with her primary provider as needed or otherwise directed. Patient is given ED precautions to return to the ED for any worsening or new symptoms.   FINAL CLINICAL IMPRESSION(S) / ED DIAGNOSES   Final diagnoses:  Thrombophlebitis arm     Rx / DC Orders   ED Discharge Orders          Ordered    gabapentin (NEURONTIN) 100 MG capsule  Daily at bedtime        04/20/22 1935    HYDROcodone-acetaminophen (NORCO) 5-325 MG tablet  3 times daily PRN        04/20/22 1935             Note:  This document was prepared  using Conservation officer, historic buildings and may include unintentional dictation errors.    Lissa Hoard, PA-C 04/20/22 2134    Minna Antis, MD 04/21/22 [phone]

## 2022-11-16 ENCOUNTER — Emergency Department
Admission: EM | Admit: 2022-11-16 | Discharge: 2022-11-16 | Disposition: A | Discharge status: Home / Self Care | Payer: BC Managed Care – PPO | Attending: Emergency Medicine | Admitting: Emergency Medicine

## 2022-11-16 ENCOUNTER — Other Ambulatory Visit: Payer: Self-pay

## 2022-11-16 ENCOUNTER — Encounter: Payer: Self-pay | Admitting: Emergency Medicine

## 2022-11-16 DIAGNOSIS — S39012A Strain of muscle, fascia and tendon of lower back, initial encounter: Secondary | ICD-10-CM

## 2022-11-16 DIAGNOSIS — Y92410 Unspecified street and highway as the place of occurrence of the external cause: Secondary | ICD-10-CM | POA: Diagnosis not present

## 2022-11-16 DIAGNOSIS — E119 Type 2 diabetes mellitus without complications: Secondary | ICD-10-CM | POA: Insufficient documentation

## 2022-11-16 DIAGNOSIS — Z7984 Long term (current) use of oral hypoglycemic drugs: Secondary | ICD-10-CM | POA: Insufficient documentation

## 2022-11-16 DIAGNOSIS — R300 Dysuria: Secondary | ICD-10-CM | POA: Insufficient documentation

## 2022-11-16 DIAGNOSIS — M25561 Pain in right knee: Secondary | ICD-10-CM | POA: Diagnosis not present

## 2022-11-16 DIAGNOSIS — M25562 Pain in left knee: Secondary | ICD-10-CM | POA: Diagnosis not present

## 2022-11-16 DIAGNOSIS — M545 Low back pain, unspecified: Secondary | ICD-10-CM | POA: Diagnosis present

## 2022-11-16 LAB — URINALYSIS, W/ REFLEX TO CULTURE (INFECTION SUSPECTED)
Bacteria, UA: NONE SEEN
Bilirubin Urine: NEGATIVE
Glucose, UA: >=500 mg/dL — AB
Hgb urine dipstick: NEGATIVE
Ketones, ur: NEGATIVE mg/dL
Leukocytes,Ua: NEGATIVE
Nitrite: NEGATIVE
Protein, ur: NEGATIVE mg/dL
Specific Gravity, Urine: 1.008 (ref 1.005–1.030)
pH: 6 (ref 5.0–8.0)

## 2022-11-16 LAB — CBG MONITORING, ED: Glucose-Capillary: 181 mg/dL — ABNORMAL HIGH (ref 70–99)

## 2022-11-16 MED ORDER — CYCLOBENZAPRINE HCL 5 MG PO TABS: 5.0000 mg | ORAL_TABLET | Freq: Three times a day (TID) | ORAL | 0 refills | Status: AC | PRN

## 2022-11-16 MED ORDER — IBUPROFEN 800 MG PO TABS: 800.0000 mg | ORAL_TABLET | Freq: Four times a day (QID) | ORAL | 0 refills | Status: AC | PRN

## 2022-11-16 MED ORDER — IBUPROFEN 800 MG PO TABS
800.0000 mg | ORAL_TABLET | Freq: Once | ORAL | Status: AC
  Administered 2022-11-16: 800 mg via ORAL
  Filled 2022-11-16: qty 1
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
## 2022-11-16 NOTE — Discharge Instructions (Signed)
You have no findings of urinary tract infection.  You did have some glucose in your urine but your glucose level was okay when we checked it with fingerstick.  Drink plenty of fluids and stay hydrated.  Keep good control of your glucose.  Follow-up with your primary care physician for ongoing symptoms.
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
## 2023-06-13 ENCOUNTER — Emergency Department
Admission: EM | Admit: 2023-06-13 | Discharge: 2023-06-13 | Disposition: A | Discharge status: Home / Self Care | Attending: Emergency Medicine | Admitting: Emergency Medicine

## 2023-06-13 ENCOUNTER — Emergency Department

## 2023-06-13 ENCOUNTER — Other Ambulatory Visit: Payer: Self-pay

## 2023-06-13 DIAGNOSIS — Z7984 Long term (current) use of oral hypoglycemic drugs: Secondary | ICD-10-CM | POA: Insufficient documentation

## 2023-06-13 DIAGNOSIS — R0602 Shortness of breath: Secondary | ICD-10-CM | POA: Diagnosis present

## 2023-06-13 DIAGNOSIS — J101 Influenza due to other identified influenza virus with other respiratory manifestations: Secondary | ICD-10-CM

## 2023-06-13 DIAGNOSIS — Z7982 Long term (current) use of aspirin: Secondary | ICD-10-CM | POA: Insufficient documentation

## 2023-06-13 DIAGNOSIS — Z7951 Long term (current) use of inhaled steroids: Secondary | ICD-10-CM | POA: Diagnosis not present

## 2023-06-13 DIAGNOSIS — I10 Essential (primary) hypertension: Secondary | ICD-10-CM | POA: Insufficient documentation

## 2023-06-13 DIAGNOSIS — J4521 Mild intermittent asthma with (acute) exacerbation: Secondary | ICD-10-CM

## 2023-06-13 DIAGNOSIS — E119 Type 2 diabetes mellitus without complications: Secondary | ICD-10-CM | POA: Diagnosis not present

## 2023-06-13 LAB — CBC
HCT: 45.9 % (ref 36.0–46.0)
Hemoglobin: 14.4 g/dL (ref 12.0–15.0)
MCH: 25.1 pg — ABNORMAL LOW (ref 26.0–34.0)
MCHC: 31.4 g/dL (ref 30.0–36.0)
MCV: 80.1 fL (ref 80.0–100.0)
Platelets: 267 10*3/uL (ref 150–400)
RBC: 5.73 MIL/uL — ABNORMAL HIGH (ref 3.87–5.11)
RDW: 15 % (ref 11.5–15.5)
WBC: 7.9 10*3/uL (ref 4.0–10.5)
nRBC: 0 % (ref 0.0–0.2)

## 2023-06-13 LAB — RESP PANEL BY RT-PCR (RSV, FLU A&B, COVID)  RVPGX2
Influenza A by PCR: POSITIVE — AB
Influenza B by PCR: NEGATIVE
Resp Syncytial Virus by PCR: NEGATIVE
SARS Coronavirus 2 by RT PCR: NEGATIVE

## 2023-06-13 LAB — BASIC METABOLIC PANEL
Anion gap: 10 (ref 5–15)
BUN: 13 mg/dL (ref 6–20)
CO2: 25 mmol/L (ref 22–32)
Calcium: 9.1 mg/dL (ref 8.9–10.3)
Chloride: 101 mmol/L (ref 98–111)
Creatinine, Ser: 0.87 mg/dL (ref 0.44–1.00)
GFR, Estimated: >60 mL/min (ref >60)
Glucose, Bld: 145 mg/dL — ABNORMAL HIGH (ref 70–99)
Potassium: 3.8 mmol/L (ref 3.5–5.1)
Sodium: 136 mmol/L (ref 135–145)

## 2023-06-13 LAB — TROPONIN I (HIGH SENSITIVITY)
Troponin I (High Sensitivity): 9 ng/L (ref <18)
Troponin I (High Sensitivity): 8 ng/L (ref <18)

## 2023-06-13 LAB — BRAIN NATRIURETIC PEPTIDE: B Natriuretic Peptide: 22.6 pg/mL (ref 0.0–100.0)

## 2023-06-13 MED ORDER — IPRATROPIUM-ALBUTEROL 0.5-2.5 (3) MG/3ML IN SOLN
6.0000 mL | Freq: Once | RESPIRATORY_TRACT | Status: AC
  Administered 2023-06-13: 6 mL via RESPIRATORY_TRACT
  Filled 2023-06-13: qty 3

## 2023-06-13 MED ORDER — METHYLPREDNISOLONE SODIUM SUCC 125 MG IJ SOLR
125.0000 mg | Freq: Once | INTRAMUSCULAR | Status: AC
  Administered 2023-06-13: 125 mg via INTRAVENOUS
  Filled 2023-06-13: qty 2

## 2023-06-13 MED ORDER — ACETAMINOPHEN 325 MG PO TABS
650.0000 mg | ORAL_TABLET | Freq: Once | ORAL | Status: AC
  Administered 2023-06-13: 650 mg via ORAL
  Filled 2023-06-13: qty 2

## 2023-06-13 MED ORDER — PREDNISONE 10 MG (21) PO TBPK: ORAL_TABLET | ORAL | 0 refills | Status: AC

## 2023-06-13 MED ORDER — IPRATROPIUM-ALBUTEROL 0.5-2.5 (3) MG/3ML IN SOLN
3.0000 mL | Freq: Once | RESPIRATORY_TRACT | Status: AC
  Administered 2023-06-13: 3 mL via RESPIRATORY_TRACT
  Filled 2023-06-13: qty 3

## 2023-06-13 MED ORDER — KETOROLAC TROMETHAMINE 30 MG/ML IJ SOLN
15.0000 mg | Freq: Once | INTRAMUSCULAR | Status: AC
  Administered 2023-06-13: 15 mg via INTRAVENOUS
  Filled 2023-06-13: qty 1

## 2023-06-13 MED ORDER — ACETAMINOPHEN 500 MG PO TABS
1000.0000 mg | ORAL_TABLET | Freq: Once | ORAL | Status: AC
  Administered 2023-06-13: 1000 mg via ORAL
  Filled 2023-06-13: qty 2

## 2023-06-13 MED ORDER — OSELTAMIVIR PHOSPHATE 75 MG PO CAPS: 75.0000 mg | ORAL_CAPSULE | Freq: Two times a day (BID) | ORAL | 0 refills | Status: AC

## 2023-06-13 MED ORDER — DM-GUAIFENESIN ER 30-600 MG PO TB12: 1.0000 | ORAL_TABLET | Freq: Two times a day (BID) | ORAL | 0 refills | Status: AC

## 2023-06-13 MED ORDER — IPRATROPIUM-ALBUTEROL 0.5-2.5 (3) MG/3ML IN SOLN: 3.0000 mL | RESPIRATORY_TRACT | 1 refills | Status: AC | PRN

## 2023-06-13 MED ORDER — ALBUTEROL SULFATE (2.5 MG/3ML) 0.083% IN NEBU: 2.5000 mg | INHALATION_SOLUTION | RESPIRATORY_TRACT | 2 refills | Status: AC | PRN

## 2023-06-13 MED ORDER — LACTATED RINGERS IV BOLUS
1000.0000 mL | Freq: Once | INTRAVENOUS | Status: AC
  Administered 2023-06-13: 1000 mL via INTRAVENOUS

## 2023-06-13 MED ORDER — BENZONATATE 100 MG PO CAPS: 100.0000 mg | ORAL_CAPSULE | Freq: Three times a day (TID) | ORAL | 0 refills | Status: AC | PRN

## 2023-06-13 NOTE — ED Triage Notes (Signed)
 Pt arrives with c/o SOB that started 2 days ago. Pt endorses cough, headache, n/v, and leg swelling. Pt denies CP, but endorses rib cage pain.

## 2023-06-13 NOTE — ED Notes (Signed)
 Patient used the restroom on herself. Patient is now in clean clothes and reports being dizzy.

## 2023-06-13 NOTE — Consult Note (Signed)
 Initial Consultation Note   Patient: [name] WJX:[id number] DOB: 08/27/1965 PCP: Roma Kayser, MD DOA: 06/13/2023 DOS: the patient was seen and examined on 06/13/2023 Primary service: No att. providers found  Referring physician: Dr. Derrill Kay Reason for consult: Influenza A positive  Assessment/Plan: Assessment and Plan: Morbidly obese lady came with complaint of shortness of breath and cough, tested positive for influenza A.  Saturating well on room air, labs seems stable, low-grade fever. Chest x-ray without any acute abnormality. There was no wheezing on exam and she was resting comfortably.  Discussed with patient and she agrees to go home.  Has a nebulizer machine but need medications.  She can be discharged on supportive care with albuterol and DuoNeb nebulizer medications. Tamiflu for 5 days Tapering course of prednisone Symptom management with Mucinex and Tessalon Perles She can use Tylenol if develop fever.  TRH will sign off at present, please call us again when needed.  HPI: [name] is a 58 y.o. female with past medical history of hypertension, morbid obesity, OSA came to ED with concern of worsening cough and shortness of breath. She was also having generalized malaise.  On presentation low-grade fever at 100, rest of the vitals and labs stable.  Chest x-ray normal and she was saturating well on room air.  Patient received breathing treatment and Solu-Medrol in ED  Review of Systems: As mentioned in the history of present illness. All other systems reviewed and are negative. Past Medical History:  Diagnosis Date   Chest pain    negative Myoview march 2009   HTN (hypertension)    controlled   Lipoma    Hx-left leg. s/p excision   Obesity    OSA (obstructive sleep apnea)    mild   Palpitation    event [place] 2/10   Rectal bleeding 5/10   normal colonoscopy. Dr. Erlene Quan infection    Past Surgical History:  Procedure Laterality Date    ABDOMINAL HYSTERECTOMY     hysterectomy unspecified area     Social History:  reports that she has been smoking. She has never used smokeless tobacco. She reports current alcohol use. She reports that she does not use drugs.  Allergies  Allergen Reactions   Sulfa Antibiotics Itching and Swelling   Sulfonamide Derivatives Swelling    Family History  Problem Relation Age of Onset   Allergies Sister        and daughter   Allergy (severe) Sister    Heart attack Sister    Asthma Daughter    Allergy (severe) Daughter    Coronary artery disease Other        female 1st degree relative <60/ female 1st degree <50   Stroke Other        female 1st degree relatvie <50   Heart disease Mother    Heart attack Mother    Other Mother        rheumatism   Heart disease Father    Heart disease Paternal Grandfather    Heart attack Paternal Grandfather    Stroke Paternal Grandfather    Cancer Paternal Grandfather        prostate   Prostate cancer Maternal Grandfather    Other Maternal Grandmother        rheumatism    Prior to Admission medications   Medication Sig Start Date End Date Taking? Authorizing Provider  albuterol (PROVENTIL) (2.5 MG/3ML) 0.083% nebulizer solution Take 3 mLs (2.5 mg total) by nebulization every 6 (  six) hours as needed for wheezing or shortness of breath. 01/01/20   Couture, Cortni S, PA-C  aspirin EC 81 MG tablet Take 81 mg by mouth daily.    [provider]  EPINEPHrine 0.3 mg/0.3 mL IJ SOAJ injection Inject 0.3 mg into the skin daily as needed (allergies).  05/19/14   [provider]  fluticasone (FLONASE) 50 MCG/ACT nasal spray Place 2 sprays into both nostrils daily. 12/01/14   Ward, Layla Maw, DO  furosemide (LASIX) 40 MG tablet Take 1 tablet (40 mg total) by mouth daily for 15 days. 09/24/19 10/09/19  Dionne Bucy, MD  gabapentin (NEURONTIN) 100 MG capsule Take 1 capsule (100 mg total) by mouth at bedtime for 15 days. 04/20/22 05/05/22  Menshew,  Charlesetta Ivory, PA-C  hydrocortisone cream 0.5 % Apply 1 Application topically 2 (two) times daily. 04/18/22   Dionne Bucy, MD  metFORMIN (GLUCOPHAGE-XR) 500 MG 24 hr tablet 2 tabs by mouth once daily. Patient taking differently: Take 1,000 mg by mouth daily.  11/25/12   Sandford Craze, NP  nebivolol (BYSTOLIC) 5 MG tablet Take 1 tablet (5 mg total) by mouth daily. 09/04/16   Adrian Saran, MD  omeprazole (PRILOSEC) 20 MG capsule Take 1 capsule (20 mg total) by mouth daily as needed (FOR ACID REFLUX). 02/04/19   Willy Eddy, MD  omeprazole (PRILOSEC) 20 MG capsule Take 1 capsule (20 mg total) by mouth daily. 12/21/19   Horton, Mayer Masker, MD  potassium chloride (MICRO-K) 10 MEQ CR capsule Take 1 capsule (10 mEq total) by mouth daily. 09/04/16   Adrian Saran, MD  predniSONE (STERAPRED UNI-PAK 21 TAB) 10 MG (21) TBPK tablet Take 6 pills on day one then decrease by 1 pill each day 10/09/20   Faythe Ghee, PA-C  sucralfate (CARAFATE) 1 g tablet Take 1 tablet (1 g total) by mouth 3 (three) times daily as needed. 02/04/19 02/04/20  Willy Eddy, MD    Physical Exam: Vitals:   06/13/23 1134 06/13/23 1135 06/13/23 1530  BP:  (!) 176/101 139/61  Pulse:  90 (!) 102  Resp:  (!) 29 (!) 26  Temp:  100.1 F (37.8 C)   TempSrc:  Oral   SpO2:  94% 96%  Weight: (!) 140.6 kg     General.  Morbidly obese lady, in no acute distress. Pulmonary.  Lungs clear bilaterally, normal respiratory effort. CV.  Regular rate and rhythm, no JVD, rub or murmur. Abdomen.  Soft, nontender, nondistended, BS positive. CNS.  Alert and oriented .  No focal neurologic deficit. Extremities.  No edema, no cyanosis, pulses intact and symmetrical. Psychiatry.  Judgment and insight appears normal.   Data Reviewed:  Prior data reviewed  Family Communication: Discussed with patient Primary team communication: Discussed with ED provider Thank you very much for involving [place] in the care of your  patient.  Author: Arnetha Courser, MD 06/13/2023 5:00 PM  For on call review [email or web address].

## 2023-06-13 NOTE — Discharge Instructions (Addendum)
 Please return for any worsening shortness of breath, chest pain or any other new or concerning symptoms.

## 2023-06-13 NOTE — ED Provider Notes (Signed)
 [facility] Provider Note    Event Date/Time   First MD Initiated Contact with Patient 06/13/23 1231     (approximate)   History   Shortness of Breath   HPI  [name] is a 58 y.o. female who presents to the ED for evaluation of Shortness of Breath   I reviewed PCP visit from 1 month ago.  Following up after an outpatient colonoscopy with the tubulovillous adenoma polyp, high-grade dysplasia.  She otherwise has a history of morbid obesity with a BMI over 50, DM, asthma  Patient presents for 2 days of shortness of breath, coughing fits, diffuse myalgias, nausea and poor appetite.   Physical Exam   Triage Vital Signs: ED Triage Vitals  Encounter Vitals Group     BP 06/13/23 1135 (!) 176/101     Systolic BP Percentile --      Diastolic BP Percentile --      Pulse Rate 06/13/23 1135 90     Resp 06/13/23 1135 (!) 29     Temp 06/13/23 1135 100.1 F (37.8 C)     Temp Source 06/13/23 1135 Oral     SpO2 06/13/23 1135 94 %     Weight 06/13/23 1134 (!) 310 lb (140.6 kg)     Height --      Head Circumference --      Peak Flow --      Pain Score --      Pain Loc --      Pain Education --      Exclude from Growth Chart --     Most recent vital signs: Vitals:   06/13/23 1135  BP: (!) 176/101  Pulse: 90  Resp: (!) 29  Temp: 100.1 F (37.8 C)  SpO2: 94%    General: Awake, no distress.  CV:  Good peripheral perfusion.  Resp:  Tachypnea without distress.  Very poor airflow throughout and faint and scattered wheezing. Abd:  No distention.  MSK:  No deformity noted.  Neuro:  No focal deficits appreciated. Other:     ED Results / Procedures / Treatments   Labs (all labs ordered are listed, but only abnormal results are displayed) Labs Reviewed  RESP PANEL BY RT-PCR (RSV, FLU A&B, COVID)  RVPGX2 - Abnormal; Notable for the following components:      Result Value   Influenza A by PCR POSITIVE (*)    All other components within normal  limits  BASIC METABOLIC PANEL - Abnormal; Notable for the following components:   Glucose, Bld 145 (*)    All other components within normal limits  CBC - Abnormal; Notable for the following components:   RBC 5.73 (*)    MCH 25.1 (*)    All other components within normal limits  BRAIN NATRIURETIC PEPTIDE  TROPONIN I (HIGH SENSITIVITY)  TROPONIN I (HIGH SENSITIVITY)    EKG Sinus rhythm with a rate of 89 bpm.  Normal axis and intervals.  No clear signs of acute ischemia.  RADIOLOGY CXR interpreted by me without evidence of acute cardiopulmonary pathology.  Official radiology report(s): DG Chest 2 View Result Date: 06/13/2023 CLINICAL DATA:  Shortness of breath starting 2 days ago. Cough and headache. EXAM: CHEST - 2 VIEW COMPARISON:  Chest radiographs 04/18/2022 and 11/08/2020 FINDINGS: Cardiac silhouette and mediastinal contours are within normal limits. Mild calcification within the aortic arch. Resolution of the prior left lower lung horizontal linear subsegmental atelectasis. The lungs are clear. No pleural effusion or pneumothorax.  No acute skeletal abnormality. IMPRESSION: No active cardiopulmonary disease. Electronically Signed   By: Neita Garnet M.D.   On: 06/13/2023 13:17    PROCEDURES and INTERVENTIONS:  Procedures  Medications  lactated ringers bolus 1,000 mL (has no administration in time range)  ipratropium-albuterol (DUONEB) 0.5-2.5 (3) MG/3ML nebulizer solution 3 mL (has no administration in time range)  ipratropium-albuterol (DUONEB) 0.5-2.5 (3) MG/3ML nebulizer solution 6 mL (6 mLs Nebulization Given 06/13/23 1344)  methylPREDNISolone sodium succinate (SOLU-MEDROL) 125 mg/2 mL injection 125 mg (125 mg Intravenous Given 06/13/23 1343)  acetaminophen (TYLENOL) tablet 1,000 mg (1,000 mg Oral Given 06/13/23 1342)  ketorolac (TORADOL) 30 MG/ML injection 15 mg (15 mg Intravenous Given 06/13/23 1343)     IMPRESSION / MDM / ASSESSMENT AND PLAN / ED COURSE  I reviewed the triage  vital signs and the nursing notes.  Differential diagnosis includes, but is not limited to, ACS, PTX, PNA, muscle strain/spasm, PE, dissection, anxiety, pleural effusion  {Patient presents with symptoms of an acute illness or injury that is potentially life-threatening.  Patient presents with evidence of an asthma exacerbation precipitated by acute influenza.  She is tachypneic but not hypoxic.  Normal CBC, BNP and metabolic panel.  Negative troponin.  Clear CXR.  Initiate steroids, breathing treatments and antipyretics and we will reassess.  Clinical Course as of 06/13/23 1527  Thu Jun 13, 2023  1526 Reassessed.  She reports feeling better but still short of breath.  She is asking about admission.  She is still wheezing. [DS]    Clinical Course User Index [DS] Delton Prairie, MD     FINAL CLINICAL IMPRESSION(S) / ED DIAGNOSES   Final diagnoses:  Mild intermittent asthma with exacerbation  Influenza A     Rx / DC Orders   ED Discharge Orders     None        Note:  This document was prepared using Dragon voice recognition software and may include unintentional dictation errors.   Delton Prairie, MD 06/13/23 [phone]
